# Patient Record
Sex: Female | Born: 1940
Health system: Southern US, Community
[De-identification: ages and names within clinical notes are randomized; demographics above are authoritative.]

## PROBLEM LIST (undated history)

## (undated) DIAGNOSIS — I639 Cerebral infarction, unspecified: Secondary | ICD-10-CM

## (undated) DIAGNOSIS — I729 Aneurysm of unspecified site: Secondary | ICD-10-CM

## (undated) DIAGNOSIS — J189 Pneumonia, unspecified organism: Secondary | ICD-10-CM

## (undated) DIAGNOSIS — M1990 Unspecified osteoarthritis, unspecified site: Secondary | ICD-10-CM

## (undated) DIAGNOSIS — E079 Disorder of thyroid, unspecified: Secondary | ICD-10-CM

## (undated) DIAGNOSIS — E7800 Pure hypercholesterolemia, unspecified: Secondary | ICD-10-CM

## (undated) DIAGNOSIS — I10 Essential (primary) hypertension: Secondary | ICD-10-CM

## (undated) DIAGNOSIS — E039 Hypothyroidism, unspecified: Secondary | ICD-10-CM

## (undated) HISTORY — PX: OTHER SURGICAL HISTORY: SHX169

---

## 1999-11-14 ENCOUNTER — Other Ambulatory Visit: Admission: RE | Admit: 1999-11-14 | Discharge: 1999-11-14 | Payer: Self-pay | Admitting: Obstetrics and Gynecology

## 2000-11-20 ENCOUNTER — Other Ambulatory Visit: Admission: RE | Admit: 2000-11-20 | Discharge: 2000-11-20 | Payer: Self-pay | Admitting: Obstetrics and Gynecology

## 2001-01-26 ENCOUNTER — Inpatient Hospital Stay (HOSPITAL_COMMUNITY): Admission: RE | Admit: 2001-01-26 | Discharge: 2001-01-29 | Payer: Self-pay | Admitting: Obstetrics and Gynecology

## 2001-01-26 ENCOUNTER — Encounter (INDEPENDENT_AMBULATORY_CARE_PROVIDER_SITE_OTHER): Payer: Self-pay

## 2001-12-09 ENCOUNTER — Other Ambulatory Visit: Admission: RE | Admit: 2001-12-09 | Discharge: 2001-12-09 | Payer: Self-pay | Admitting: Obstetrics and Gynecology

## 2002-11-04 ENCOUNTER — Emergency Department (HOSPITAL_COMMUNITY): Admission: EM | Admit: 2002-11-04 | Discharge: 2002-11-04 | Payer: Self-pay | Admitting: Emergency Medicine

## 2002-11-04 ENCOUNTER — Encounter: Payer: Self-pay | Admitting: Emergency Medicine

## 2002-11-05 ENCOUNTER — Encounter: Payer: Self-pay | Admitting: Emergency Medicine

## 2002-12-29 ENCOUNTER — Other Ambulatory Visit: Admission: RE | Admit: 2002-12-29 | Discharge: 2002-12-29 | Payer: Self-pay | Admitting: Obstetrics and Gynecology

## 2004-03-30 ENCOUNTER — Other Ambulatory Visit: Admission: RE | Admit: 2004-03-30 | Discharge: 2004-03-30 | Payer: Self-pay | Admitting: Obstetrics and Gynecology

## 2005-04-05 ENCOUNTER — Other Ambulatory Visit: Admission: RE | Admit: 2005-04-05 | Discharge: 2005-04-05 | Payer: Self-pay | Admitting: Obstetrics and Gynecology

## 2006-06-11 ENCOUNTER — Other Ambulatory Visit: Admission: RE | Admit: 2006-06-11 | Discharge: 2006-06-11 | Payer: Self-pay | Admitting: Obstetrics and Gynecology

## 2007-04-17 ENCOUNTER — Encounter: Admission: RE | Admit: 2007-04-17 | Discharge: 2007-04-17 | Payer: Self-pay | Admitting: Family Medicine

## 2007-07-06 ENCOUNTER — Other Ambulatory Visit: Admission: RE | Admit: 2007-07-06 | Discharge: 2007-07-06 | Payer: Self-pay | Admitting: Obstetrics and Gynecology

## 2008-08-12 ENCOUNTER — Encounter: Admission: RE | Admit: 2008-08-12 | Discharge: 2008-08-12 | Payer: Self-pay | Admitting: Family Medicine

## 2008-08-19 ENCOUNTER — Encounter: Admission: RE | Admit: 2008-08-19 | Discharge: 2008-08-19 | Payer: Self-pay | Admitting: Family Medicine

## 2008-08-29 ENCOUNTER — Encounter: Admission: RE | Admit: 2008-08-29 | Discharge: 2008-08-29 | Payer: Self-pay | Admitting: Family Medicine

## 2008-09-24 ENCOUNTER — Emergency Department (HOSPITAL_COMMUNITY): Admission: EM | Admit: 2008-09-24 | Discharge: 2008-09-24 | Payer: Self-pay | Admitting: Emergency Medicine

## 2008-10-24 ENCOUNTER — Encounter: Admission: RE | Admit: 2008-10-24 | Discharge: 2008-10-24 | Payer: Self-pay | Admitting: Internal Medicine

## 2009-07-24 ENCOUNTER — Encounter: Payer: Self-pay | Admitting: Obstetrics and Gynecology

## 2009-07-24 ENCOUNTER — Ambulatory Visit: Payer: Self-pay | Admitting: Obstetrics and Gynecology

## 2009-07-24 ENCOUNTER — Other Ambulatory Visit: Admission: RE | Admit: 2009-07-24 | Discharge: 2009-07-24 | Payer: Self-pay | Admitting: Obstetrics and Gynecology

## 2009-07-28 ENCOUNTER — Encounter (INDEPENDENT_AMBULATORY_CARE_PROVIDER_SITE_OTHER): Payer: Self-pay | Admitting: *Deleted

## 2010-07-25 ENCOUNTER — Ambulatory Visit: Payer: Self-pay | Admitting: Obstetrics and Gynecology

## 2010-09-18 ENCOUNTER — Encounter: Admission: RE | Admit: 2010-09-18 | Discharge: 2010-09-18 | Payer: Self-pay | Admitting: Ophthalmology

## 2010-12-27 NOTE — Letter (Signed)
Summary: Recall Colonoscopy Letter  [facility]  [address] [place], [place] [address]   Phone: [phone]  Fax: [phone]      July 28, 2009 MRN: [id number]   AMAIA LAVALLIE [address] [place], [place]  [address]   Dear Ms. Windholz,   According to your medical record, it is time for you to schedule a Colonoscopy. The American Cancer Society recommends this procedure as a method to detect early colon cancer. Patients with a family history of colon cancer, or a personal history of colon polyps or inflammatory bowel disease are at increased risk.  This letter has beeen generated based on the recommendations made at the time of your procedure. If you feel that in your particular situation this may no longer apply, please contact our office.  Please call our office at [phone] to schedule this appointment or to update your records at your earliest convenience.  Thank you for cooperating with [place] to provide you with the very best care possible.   Sincerely,  Wilhemina Bonito. Marina Goodell, M.D.  [facility] [phone]

## 2011-04-12 NOTE — H&P (Signed)
[facility]  Patient:    Jodi Woods, Jodi Woods                       MRN: [id number] Attending:  Rande Brunt. Eda Paschal, M.D.                         History and Physical  CHIEF COMPLAINT:  Symptomatic pelvic relaxation with incontinence.  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 Initially, she had considered surgery for this but had continued to postpone it.  Dr. Maretta Bees. Vonita Moss felt that her problem would be amenable to a surgical procedure, at least the urinary stress part of it.  In addition to having this incontinence, she has had both a cystocele, rectocele and uterine prolapse, however, initially, these were not giving her any problems; however, over the past year, she has started to notice that her uterine prolapse has been more of an issue for her, she is aware that the uterus has dropped and that it has been uncomfortable.  She has a history of lower back pain as well as her urinary frequency; she has also had difficulty eliminating stool as a result of the rectocele and therefore, because of a worsening of her uterine prolapse and rectocele as well as her urinary frequency associated with the cystocele and incontinence, she now enters the hospital for vaginal hysterectomy, anterior and posterior repair and sling procedure.  We have discussed removing her ovaries, since she is postmenopausal, and she would like to have them removed if it can be done vaginally; she does not, however, want to have an incision made to remove her ovaries alone.  Obviously, she understands there is some risk of requiring an incision for a complication of her surgery.  PAST MEDICAL HISTORY:  No real significant illnesses.  PRESENT MEDICATIONS:  Ortho-Est and Provera.  ALLERGIES:  She is  allergic to no drugs.  PAST SURGICAL HISTORY:  She has had no previous surgery.  FAMILY HISTORY:  Significant in the family history of heart disease, her maternal grandmother.  SOCIAL HISTORY:  She is a nonsmoker, nondrinker.  REVIEW OF SYSTEMS:  HEENT:  Negative.  CARDIAC:  Negative.  RESPIRATORY: Negative.  GI:  Negative.  GU:  See above.  NEUROMUSCULAR:  Joint pain. ENDOCRINE:  Negative.  PHYSICAL EXAMINATION  GENERAL:  Patient is a well-developed, well-nourished female in no acute distress.  VITAL SIGNS:  Blood pressure is 120/74, pulse is 80 and regular, respirations 16 and nonlabored.  She is afebrile.  HEENT:  All within normal limits.  NECK:  Supple.  Trachea in the midline.  Thyroid is not enlarged.  LUNGS:  Clear to P&A.  HEART:  No thrills, heaves or murmurs.  BREASTS:  No masses.  ABDOMEN:  Soft, without guarding, rebound or masses.  PELVIC:  External is within normal limits.  BUS within normal limits.  Bladder shows loss of the urethrovesical angle; there is also a modest cystocele. Vagina reveals a second degree rectocele.  Cervix is clean.  Pap smear shows no atypia.  Uterus is retroverted, normal size and shape with second degree uterine descensus.  Adnexa are palpably normal.  RECTAL:  Negative.  EXTREMITIES:  Within normal limits.  ADMISSION IMPRESSION 1. Uterine prolapse. 2. Cystocele. 3. Rectocele. 4.  Urinary stress incontinence. DD:  01/26/01 TD:  01/27/01 Job: [id number]

## 2011-04-12 NOTE — Op Note (Signed)
[facility]  Patient:    Jodi Woods, Jodi Woods                       MRN: [id number] Proc. Date: 01/26/01 Attending:  Rande Brunt. Eda Paschal, M.D.                           Operative Report  PREOPERATIVE DIAGNOSES:  Symptomatic uterine prolapse, cystocele, rectocele, urinary stress incontinence.  POSTOPERATIVE DIAGNOSES:  Symptomatic uterine prolapse, cystocele, rectocele, urinary stress incontinence.  OPERATION PERFORMED:  Vaginal hysterectomy followed by exploratory laparotomy with bilateral salpingo-oophorectomy, bladder neck suspension and posterior repair.  SURGEON:  Dr. Eda Paschal and Dr. Vonita Moss.  FIRST ASSISTANT:  Dr. Farrel Gobble.  FINDINGS:  At the time of vaginal hysterectomy, the patient had second degree uterine prolapse, a small cystocele, a large rectocele, ovaries and fallopian tubes were postmenopausal, free of any disease. The pelvic peritoneum was free of any disease.  DESCRIPTION OF PROCEDURE:  After adequate general endotracheal anesthesia, the patient was placed in the dorsal lithotomy position, prepped and draped in the usual sterile manner. A 1:200,000 solution and 0.5% xylocaine was injected around the cervix. A 360 degree incision was made around the cervix, the bladder was mobilized superiorly as was the posterior peritoneum. The posterior peritoneum was entered by sharp dissection as was the vesicouterine fold to peritoneum. The uterosacral ligaments were clamped. In clamping them, they were shortened. They were then sutured to the vaginal cuff laterally for good vault support. The cardinal ligaments, uterine arteries, and balance of the broad ligament were handled in a similar fashion in terms of clamping, cutting and suture ligating. #1 chromic catgut was utilized for all suture material. At this point, the uterus was delivered. On the left, the utero-ovarian ligament, round ligament and fallopian tube were clamped, cut and suture  ligated with #1 chromic catgut. On the right since we had planned to do a bilateral salpingo-oophorectomy, the ovary and tube were delivered which they could not be on the left and the infundibulopelvic ligament was clamped and cut and at this point the uterus was delivered along with the right ovary and tube and sent to pathology for tissue diagnosis. The patients tissues were exceedingly friable and in attempting to suture the infundibulopelvic ligament, uncontrollable bleeding was encountered. The bleeding actually had started when the clamps and the infundibulopelvic ligament came loose freeing up the uterus from it. Initially the infundibulopelvic ligament was controlled with new clamps but every time it was attempted to suture it. There was no good way to suture it and the clamps contained flimsy material that kept ripping and it was obvious that she was bleeding. After about a three to four minute attempt to try to stop it, it became more and more obvious that we were just going to ______ the tissue more. We were very concerned about retraction of blood vessels and it was really felt not safe to continue vaginally. At this point, therefore her legs were put down. A small Pfannenstiel incision was made. The fascia was opened transversely. The peritoneum was entered vertically. The bleeders on the infundibulopelvic ligament were easily identified, they were clamped. The peritoneum was entered on that side to be sure that we had not inadvertently gotten the ureter and we had not as the ureter was significantly below it. Two #1 chromic suture ligatures were placed around the infundibulopelvic ligament and now there was on bleeding whatsoever. The left ovary and  tube which had been left in place and probably would not have been able to be removed vaginally was then removed after the ureter had been identified and once again the infundibulopelvic ligaments were clamped, cut, and doubly  suture ligated with #1 chromic catgut. At this point, Dr. Vonita Moss entered the operating suite having been called to come to do the sling prior to this bleeding problem on the right. The peritoneum was closed with a running #0 Vicryl after two sponge, needle and instrument counts were correct. In evaluating the situation which consisted of the fact that the patient now had a small Pfannenstiel incision and that she had very poor tissue near her bladder as well with an attenuated bladder wall and looking both from an abdominal and vaginal approach, he felt and I agreed that we could do her bladder neck suspension from above and also eliminate the cystocele with the suspension and we felt that was in her best interest so this was done at this point and this note is dictated by Dr. Vonita Moss. We then closed her fascia with two running #0 Vicryl which were running sutures, copiously irrigated her subcutaneous tissue and closed her skin with staples. We next returned to a vaginal approach. The vaginal cuff was sutured to the posterior peritoneum with a running locking #0 Vicryl. A modified McCall enterocele suture was placed to prevent an enterocele with 2-0 Vicryl and then the cuff and peritoneum were closed with a running #0 Vicryl. Attention was next turned to the posterior repair. Prior to starting the posterior repair, we reinspected the anterior wall and she had no cystocele left as a result of his suspension. The vaginal mucosa was undermined from the introitus to the top of the cuff. The perirectal fascia and the rectocele were sharply dissected free from the mucosa. The surgeon put his finger in the rectum to help the dissection. At this point, an excellent dissection had been obtained. The perirectal fascia and the rectocele were brought together with 2-0 Vicryl in order to completely eliminate the rectocele. Approximately 15 such sutures were utilized. The redundant vaginal mucosa was  trimmed away and then the posterior vaginal mucosa was closed with a running locking 2-0 Vicryl also incorporating perirectal fascia to eliminate dead space. It should be noted that when a  decision had been made to do a laparotomy, a Foley catheter had been inserted which drained clear urine. It was reinspected at this time and was still draining clear urine. The vagina was packed with one inch Iodoform. Estimated blood loss was 350 cc with none replaced. The patient tolerated the procedure well and left the operating room in satisfactory condition draining clear urine from her Foley catheter. DD:  01/26/01 TD:  01/27/01 Job: [id number]

## 2011-04-12 NOTE — Op Note (Signed)
[facility]  Patient:    Jodi Woods, Jodi Woods                   MRN: [id number] Proc. Date: 01/26/01 Adm. Date:  [id number] Attending:  Sharon Mt CC:         Rande Brunt. Eda Paschal, M.D.   Operative Report  PREOPERATIVE DIAGNOSES:  Stress urinary incontinence.  POSTOPERATIVE DIAGNOSES:  Stress urinary incontinence.  PROCEDURE:  Suprapubic bladder suspension.  SURGEON:  Dr. Larey Dresser.  ASSISTANT:  Dr. Edyth Gunnels.  INDICATIONS FOR PROCEDURE:  This 70 year old white female needed a rectocele repair and total hysterectomy in addition to surgery for stress incontinence. A vaginal hysterectomy was planned along with a pubovaginal sling. For technical reasons, Dr. Eda Paschal ended up making a suprapubic Pfannenstiel incision to control some bleeding from the right ovarian pedicle and also to do a left oophorectomy. I then scrubbed in and procedure is dictated in the following paragraph.  DESCRIPTION OF PROCEDURE:  Dr. Eda Paschal and I inspected the vaginal canal and the bladder through the suprapubic incision but there did not seem to be any significant cystocele repair to be necessary. So we felt that rather than adding another suprapubic incision near the pubis for a pubovaginal sling and then not having to do an anterior vaginal incision, we had a very good alternative with the suprapubic bladder suspension. After Dr. Eda Paschal closed the peritoneum, I identified the very upper superior aspect of the bladder by mobilizing the Foley catheter balloon and put in a #0 Vicryl figure-of-eight suture with a UR5 needle. At the end of my procedure, I sutured it to the overlying rectus fascia. I put another #0 Vicryl figure-of-eight suture just above the bladder neck in the midline and sutured that to the top of the symphysis pubis nicely elevating the bladder. Two other midline sutures were placed and the lower one was placed in the rectus  fascia just above the symphysis pubis and the other one was midway between that stitch and the one at the very upper anterior aspect of the bladder. Those stitches were then placed through the rectus muscle and into the rectus fascia nicely elevating the bladder in good position. The fascia was then closed with #1 Vicryl and the wound irrigated and closed with skin staples. We then reinspected the vaginal canal and she had no significant cystocele at this time. Dr. Eda Paschal concluded the case dictated separately. DD:  01/26/01 TD:  01/27/01 Job: [id number]

## 2011-04-12 NOTE — Discharge Summary (Signed)
[facility]  Patient:    Jodi Woods, Jodi Woods                   MRN: [id number] Adm. Date:  [id number] Disc. Date: [id number] Attending:  Sharon Mt                           Discharge Summary  HISTORY OF PRESENT ILLNESS:  The patient is a 70 year old female who was admitted to the hospital with symptomatic pelvic relaxation for definitive surgery.  HOSPITAL COURSE:  On the day of admission, she was taken to the operating room.  Initially a vaginal hysterectomy was performed.  She was then to undergo a bilateral salpingo-oophorectomy as was her desire at 32.  The right adnexa could be delivered into the incision, the ligament could be clamped, and the right adnexa was removed.  However, the tissue was extremely friable and part of the infundibular pelvic ligament was lost because of the poor quality of the tissue and it was not felt that it could be adequately ligated from below.  As a result of that, she underwent an exploratory laparotomy through a small Pfannenstiel incision.  The bleeder underneath the infundibular pelvic ligament was quickly identified and ligated.  It also gave [place] the opportunity to remove her left ovary and tube, which would not have been able to be removed vaginally.  At this point, Maretta Bees. Vonita Moss, M.D., came in.  Initially he was to do a sling procedure, but as a result of the small Pfannenstiel incision and the poor quality of her tissue, not just infundibular pelvic ligament at the bladder, he felt that a bladder neck suspension done abdominally would be more appropriate.  This was done.  We closed the laparotomy incision.  We then went below.  Her cystocele had been completely reduced, but her rectocele was repaired at that time. Postoperatively, she had some trouble with nausea and an ileus, but by the third postoperative day, she was doing well with that.  She was also voiding well.  DISPOSITION:  She was  discharged to return to both my office and Dr. Maretta Bees. Petersons office in two to three weeks.  DIET ON DISCHARGE:  Regular.  ACTIVITY ON DISCHARGE:  Ambulatory.  DISCHARGE MEDICATIONS:  Darvocet-N 100 for pain relief and iron for iron replacement.  She is also going to resume her Ortho-Est when she goes home.  The final pathology report revealed uterus with adenomyosis and ovaries and tubes with no pathological abnormalities.  DISCHARGE DIAGNOSES: 1. Uterine prolapse. 2. Cystocele. 3. Rectocele. 4. Urinary stress incontinence. 5. Adenomyosis.  OPERATIONS: 1. Vaginal hysterectomy. 2. Exploratory laparotomy. 3. Bilateral salpingo-oophorectomy. 4. Bladder neck suspension. 5. Rectocele repair. DD:  01/29/01 TD:  01/29/01 Job: [id number]

## 2011-12-06 ENCOUNTER — Other Ambulatory Visit: Payer: Self-pay | Admitting: *Deleted

## 2011-12-06 DIAGNOSIS — M8580 Other specified disorders of bone density and structure, unspecified site: Secondary | ICD-10-CM

## 2011-12-27 ENCOUNTER — Encounter: Payer: Self-pay | Admitting: Obstetrics and Gynecology

## 2011-12-30 ENCOUNTER — Other Ambulatory Visit: Payer: Self-pay | Admitting: Obstetrics and Gynecology

## 2011-12-30 DIAGNOSIS — M8580 Other specified disorders of bone density and structure, unspecified site: Secondary | ICD-10-CM

## 2013-01-27 ENCOUNTER — Other Ambulatory Visit: Payer: Self-pay | Admitting: Internal Medicine

## 2013-01-27 DIAGNOSIS — R9389 Abnormal findings on diagnostic imaging of other specified body structures: Secondary | ICD-10-CM

## 2013-01-29 ENCOUNTER — Other Ambulatory Visit: Payer: Self-pay

## 2013-02-02 ENCOUNTER — Ambulatory Visit
Admission: RE | Admit: 2013-02-02 | Discharge: 2013-02-02 | Disposition: A | Discharge status: Home / Self Care | Payer: Medicare Other | Source: Ambulatory Visit | Attending: Internal Medicine | Admitting: Internal Medicine

## 2013-02-02 DIAGNOSIS — R9389 Abnormal findings on diagnostic imaging of other specified body structures: Secondary | ICD-10-CM

## 2013-02-02 MED ORDER — IOHEXOL 300 MG/ML  SOLN
75.0000 mL | Freq: Once | INTRAMUSCULAR | Status: AC | PRN
  Administered 2013-02-02: 75 mL via INTRAVENOUS

## 2015-01-27 DIAGNOSIS — H16103 Unspecified superficial keratitis, bilateral: Secondary | ICD-10-CM | POA: Diagnosis not present

## 2015-01-27 DIAGNOSIS — H04123 Dry eye syndrome of bilateral lacrimal glands: Secondary | ICD-10-CM | POA: Diagnosis not present

## 2015-01-27 DIAGNOSIS — H26491 Other secondary cataract, right eye: Secondary | ICD-10-CM | POA: Diagnosis not present

## 2015-01-27 DIAGNOSIS — H31001 Unspecified chorioretinal scars, right eye: Secondary | ICD-10-CM | POA: Diagnosis not present

## 2015-07-10 DIAGNOSIS — H8110 Benign paroxysmal vertigo, unspecified ear: Secondary | ICD-10-CM | POA: Diagnosis not present

## 2015-07-10 DIAGNOSIS — R252 Cramp and spasm: Secondary | ICD-10-CM | POA: Diagnosis not present

## 2015-07-10 DIAGNOSIS — M545 Low back pain: Secondary | ICD-10-CM | POA: Diagnosis not present

## 2015-07-10 DIAGNOSIS — I10 Essential (primary) hypertension: Secondary | ICD-10-CM | POA: Diagnosis not present

## 2015-07-20 DIAGNOSIS — M6281 Muscle weakness (generalized): Secondary | ICD-10-CM | POA: Diagnosis not present

## 2015-07-20 DIAGNOSIS — M545 Low back pain: Secondary | ICD-10-CM | POA: Diagnosis not present

## 2015-07-20 DIAGNOSIS — M546 Pain in thoracic spine: Secondary | ICD-10-CM | POA: Diagnosis not present

## 2015-07-25 DIAGNOSIS — M545 Low back pain: Secondary | ICD-10-CM | POA: Diagnosis not present

## 2015-07-25 DIAGNOSIS — M546 Pain in thoracic spine: Secondary | ICD-10-CM | POA: Diagnosis not present

## 2015-07-25 DIAGNOSIS — M6281 Muscle weakness (generalized): Secondary | ICD-10-CM | POA: Diagnosis not present

## 2015-07-27 DIAGNOSIS — M6281 Muscle weakness (generalized): Secondary | ICD-10-CM | POA: Diagnosis not present

## 2015-07-27 DIAGNOSIS — M545 Low back pain: Secondary | ICD-10-CM | POA: Diagnosis not present

## 2015-07-27 DIAGNOSIS — M546 Pain in thoracic spine: Secondary | ICD-10-CM | POA: Diagnosis not present

## 2015-08-04 DIAGNOSIS — M545 Low back pain: Secondary | ICD-10-CM | POA: Diagnosis not present

## 2015-08-04 DIAGNOSIS — M6281 Muscle weakness (generalized): Secondary | ICD-10-CM | POA: Diagnosis not present

## 2015-08-04 DIAGNOSIS — M546 Pain in thoracic spine: Secondary | ICD-10-CM | POA: Diagnosis not present

## 2015-08-08 DIAGNOSIS — M545 Low back pain: Secondary | ICD-10-CM | POA: Diagnosis not present

## 2015-08-08 DIAGNOSIS — M546 Pain in thoracic spine: Secondary | ICD-10-CM | POA: Diagnosis not present

## 2015-08-08 DIAGNOSIS — M6281 Muscle weakness (generalized): Secondary | ICD-10-CM | POA: Diagnosis not present

## 2015-08-10 DIAGNOSIS — M6281 Muscle weakness (generalized): Secondary | ICD-10-CM | POA: Diagnosis not present

## 2015-08-10 DIAGNOSIS — M546 Pain in thoracic spine: Secondary | ICD-10-CM | POA: Diagnosis not present

## 2015-08-10 DIAGNOSIS — M545 Low back pain: Secondary | ICD-10-CM | POA: Diagnosis not present

## 2015-08-14 DIAGNOSIS — M6281 Muscle weakness (generalized): Secondary | ICD-10-CM | POA: Diagnosis not present

## 2015-08-14 DIAGNOSIS — M546 Pain in thoracic spine: Secondary | ICD-10-CM | POA: Diagnosis not present

## 2015-08-14 DIAGNOSIS — M545 Low back pain: Secondary | ICD-10-CM | POA: Diagnosis not present

## 2015-08-18 DIAGNOSIS — M545 Low back pain: Secondary | ICD-10-CM | POA: Diagnosis not present

## 2015-08-18 DIAGNOSIS — M6281 Muscle weakness (generalized): Secondary | ICD-10-CM | POA: Diagnosis not present

## 2015-08-18 DIAGNOSIS — M546 Pain in thoracic spine: Secondary | ICD-10-CM | POA: Diagnosis not present

## 2015-08-24 DIAGNOSIS — M545 Low back pain: Secondary | ICD-10-CM | POA: Diagnosis not present

## 2015-08-24 DIAGNOSIS — M6281 Muscle weakness (generalized): Secondary | ICD-10-CM | POA: Diagnosis not present

## 2015-08-24 DIAGNOSIS — M546 Pain in thoracic spine: Secondary | ICD-10-CM | POA: Diagnosis not present

## 2015-08-28 DIAGNOSIS — M545 Low back pain: Secondary | ICD-10-CM | POA: Diagnosis not present

## 2015-08-28 DIAGNOSIS — M546 Pain in thoracic spine: Secondary | ICD-10-CM | POA: Diagnosis not present

## 2015-08-28 DIAGNOSIS — M6281 Muscle weakness (generalized): Secondary | ICD-10-CM | POA: Diagnosis not present

## 2015-08-30 DIAGNOSIS — M545 Low back pain: Secondary | ICD-10-CM | POA: Diagnosis not present

## 2015-08-30 DIAGNOSIS — M546 Pain in thoracic spine: Secondary | ICD-10-CM | POA: Diagnosis not present

## 2015-08-30 DIAGNOSIS — M6281 Muscle weakness (generalized): Secondary | ICD-10-CM | POA: Diagnosis not present

## 2015-09-04 DIAGNOSIS — M546 Pain in thoracic spine: Secondary | ICD-10-CM | POA: Diagnosis not present

## 2015-09-04 DIAGNOSIS — M545 Low back pain: Secondary | ICD-10-CM | POA: Diagnosis not present

## 2015-09-04 DIAGNOSIS — M6281 Muscle weakness (generalized): Secondary | ICD-10-CM | POA: Diagnosis not present

## 2015-09-07 DIAGNOSIS — M546 Pain in thoracic spine: Secondary | ICD-10-CM | POA: Diagnosis not present

## 2015-09-07 DIAGNOSIS — M6281 Muscle weakness (generalized): Secondary | ICD-10-CM | POA: Diagnosis not present

## 2015-09-07 DIAGNOSIS — M545 Low back pain: Secondary | ICD-10-CM | POA: Diagnosis not present

## 2015-09-19 DIAGNOSIS — M545 Low back pain: Secondary | ICD-10-CM | POA: Diagnosis not present

## 2015-09-19 DIAGNOSIS — M546 Pain in thoracic spine: Secondary | ICD-10-CM | POA: Diagnosis not present

## 2015-09-19 DIAGNOSIS — M6281 Muscle weakness (generalized): Secondary | ICD-10-CM | POA: Diagnosis not present

## 2015-09-22 DIAGNOSIS — M545 Low back pain: Secondary | ICD-10-CM | POA: Diagnosis not present

## 2015-09-22 DIAGNOSIS — M546 Pain in thoracic spine: Secondary | ICD-10-CM | POA: Diagnosis not present

## 2015-09-22 DIAGNOSIS — M6281 Muscle weakness (generalized): Secondary | ICD-10-CM | POA: Diagnosis not present

## 2015-09-28 DIAGNOSIS — M546 Pain in thoracic spine: Secondary | ICD-10-CM | POA: Diagnosis not present

## 2015-09-28 DIAGNOSIS — M6281 Muscle weakness (generalized): Secondary | ICD-10-CM | POA: Diagnosis not present

## 2015-09-28 DIAGNOSIS — M545 Low back pain: Secondary | ICD-10-CM | POA: Diagnosis not present

## 2015-10-13 DIAGNOSIS — M6281 Muscle weakness (generalized): Secondary | ICD-10-CM | POA: Diagnosis not present

## 2015-10-13 DIAGNOSIS — M545 Low back pain: Secondary | ICD-10-CM | POA: Diagnosis not present

## 2015-10-13 DIAGNOSIS — M546 Pain in thoracic spine: Secondary | ICD-10-CM | POA: Diagnosis not present

## 2016-01-19 DIAGNOSIS — Z1389 Encounter for screening for other disorder: Secondary | ICD-10-CM | POA: Diagnosis not present

## 2016-01-19 DIAGNOSIS — I10 Essential (primary) hypertension: Secondary | ICD-10-CM | POA: Diagnosis not present

## 2016-01-19 DIAGNOSIS — E039 Hypothyroidism, unspecified: Secondary | ICD-10-CM | POA: Diagnosis not present

## 2016-01-19 DIAGNOSIS — H6121 Impacted cerumen, right ear: Secondary | ICD-10-CM | POA: Diagnosis not present

## 2016-01-19 DIAGNOSIS — Z0000 Encounter for general adult medical examination without abnormal findings: Secondary | ICD-10-CM | POA: Diagnosis not present

## 2016-01-19 DIAGNOSIS — E7800 Pure hypercholesterolemia, unspecified: Secondary | ICD-10-CM | POA: Diagnosis not present

## 2016-01-25 DIAGNOSIS — H6121 Impacted cerumen, right ear: Secondary | ICD-10-CM | POA: Diagnosis not present

## 2016-01-26 DIAGNOSIS — M85851 Other specified disorders of bone density and structure, right thigh: Secondary | ICD-10-CM | POA: Diagnosis not present

## 2016-01-26 DIAGNOSIS — Z1231 Encounter for screening mammogram for malignant neoplasm of breast: Secondary | ICD-10-CM | POA: Diagnosis not present

## 2016-02-01 DIAGNOSIS — H04123 Dry eye syndrome of bilateral lacrimal glands: Secondary | ICD-10-CM | POA: Diagnosis not present

## 2016-02-01 DIAGNOSIS — H31001 Unspecified chorioretinal scars, right eye: Secondary | ICD-10-CM | POA: Diagnosis not present

## 2016-02-01 DIAGNOSIS — H16103 Unspecified superficial keratitis, bilateral: Secondary | ICD-10-CM | POA: Diagnosis not present

## 2016-02-01 DIAGNOSIS — Z0100 Encounter for examination of eyes and vision without abnormal findings: Secondary | ICD-10-CM | POA: Diagnosis not present

## 2016-03-01 DIAGNOSIS — Z6830 Body mass index (BMI) 30.0-30.9, adult: Secondary | ICD-10-CM | POA: Diagnosis not present

## 2016-03-01 DIAGNOSIS — M85851 Other specified disorders of bone density and structure, right thigh: Secondary | ICD-10-CM | POA: Diagnosis not present

## 2016-03-01 DIAGNOSIS — E663 Overweight: Secondary | ICD-10-CM | POA: Diagnosis not present

## 2016-03-01 DIAGNOSIS — I10 Essential (primary) hypertension: Secondary | ICD-10-CM | POA: Diagnosis not present

## 2016-03-09 DIAGNOSIS — Z6830 Body mass index (BMI) 30.0-30.9, adult: Secondary | ICD-10-CM | POA: Diagnosis not present

## 2016-03-09 DIAGNOSIS — I10 Essential (primary) hypertension: Secondary | ICD-10-CM | POA: Diagnosis not present

## 2016-03-09 DIAGNOSIS — E785 Hyperlipidemia, unspecified: Secondary | ICD-10-CM | POA: Diagnosis not present

## 2016-03-09 DIAGNOSIS — E039 Hypothyroidism, unspecified: Secondary | ICD-10-CM | POA: Diagnosis not present

## 2016-05-01 DIAGNOSIS — M25561 Pain in right knee: Secondary | ICD-10-CM | POA: Diagnosis not present

## 2016-05-03 DIAGNOSIS — D2371 Other benign neoplasm of skin of right lower limb, including hip: Secondary | ICD-10-CM | POA: Diagnosis not present

## 2016-05-03 DIAGNOSIS — M2011 Hallux valgus (acquired), right foot: Secondary | ICD-10-CM | POA: Diagnosis not present

## 2016-08-07 DIAGNOSIS — G8929 Other chronic pain: Secondary | ICD-10-CM | POA: Diagnosis not present

## 2016-08-07 DIAGNOSIS — M8580 Other specified disorders of bone density and structure, unspecified site: Secondary | ICD-10-CM | POA: Diagnosis not present

## 2016-08-07 DIAGNOSIS — I10 Essential (primary) hypertension: Secondary | ICD-10-CM | POA: Diagnosis not present

## 2016-08-07 DIAGNOSIS — Z23 Encounter for immunization: Secondary | ICD-10-CM | POA: Diagnosis not present

## 2016-08-07 DIAGNOSIS — M25561 Pain in right knee: Secondary | ICD-10-CM | POA: Diagnosis not present

## 2016-08-16 DIAGNOSIS — M1711 Unilateral primary osteoarthritis, right knee: Secondary | ICD-10-CM | POA: Diagnosis not present

## 2016-10-29 DIAGNOSIS — R42 Dizziness and giddiness: Secondary | ICD-10-CM | POA: Diagnosis not present

## 2016-10-29 DIAGNOSIS — R05 Cough: Secondary | ICD-10-CM | POA: Diagnosis not present

## 2016-10-30 ENCOUNTER — Other Ambulatory Visit: Payer: Self-pay | Admitting: Internal Medicine

## 2016-10-30 ENCOUNTER — Ambulatory Visit
Admission: RE | Admit: 2016-10-30 | Discharge: 2016-10-30 | Disposition: A | Discharge status: Home / Self Care | Payer: Medicare PPO | Source: Ambulatory Visit | Attending: Internal Medicine | Admitting: Internal Medicine

## 2016-10-30 DIAGNOSIS — R05 Cough: Secondary | ICD-10-CM

## 2016-10-30 DIAGNOSIS — R059 Cough, unspecified: Secondary | ICD-10-CM

## 2017-01-22 DIAGNOSIS — Z0000 Encounter for general adult medical examination without abnormal findings: Secondary | ICD-10-CM | POA: Diagnosis not present

## 2017-01-22 DIAGNOSIS — Z6830 Body mass index (BMI) 30.0-30.9, adult: Secondary | ICD-10-CM | POA: Diagnosis not present

## 2017-01-22 DIAGNOSIS — I10 Essential (primary) hypertension: Secondary | ICD-10-CM | POA: Diagnosis not present

## 2017-01-22 DIAGNOSIS — E039 Hypothyroidism, unspecified: Secondary | ICD-10-CM | POA: Diagnosis not present

## 2017-01-22 DIAGNOSIS — E669 Obesity, unspecified: Secondary | ICD-10-CM | POA: Diagnosis not present

## 2017-01-22 DIAGNOSIS — Z1389 Encounter for screening for other disorder: Secondary | ICD-10-CM | POA: Diagnosis not present

## 2017-01-22 DIAGNOSIS — E7800 Pure hypercholesterolemia, unspecified: Secondary | ICD-10-CM | POA: Diagnosis not present

## 2017-02-03 DIAGNOSIS — Z1231 Encounter for screening mammogram for malignant neoplasm of breast: Secondary | ICD-10-CM | POA: Diagnosis not present

## 2017-03-06 DIAGNOSIS — I10 Essential (primary) hypertension: Secondary | ICD-10-CM | POA: Diagnosis not present

## 2017-03-12 DIAGNOSIS — H04123 Dry eye syndrome of bilateral lacrimal glands: Secondary | ICD-10-CM | POA: Diagnosis not present

## 2017-03-12 DIAGNOSIS — H52203 Unspecified astigmatism, bilateral: Secondary | ICD-10-CM | POA: Diagnosis not present

## 2017-03-12 DIAGNOSIS — H31001 Unspecified chorioretinal scars, right eye: Secondary | ICD-10-CM | POA: Diagnosis not present

## 2017-03-12 DIAGNOSIS — H26491 Other secondary cataract, right eye: Secondary | ICD-10-CM | POA: Diagnosis not present

## 2017-03-18 DIAGNOSIS — Z5181 Encounter for therapeutic drug level monitoring: Secondary | ICD-10-CM | POA: Diagnosis not present

## 2017-04-04 DIAGNOSIS — E669 Obesity, unspecified: Secondary | ICD-10-CM | POA: Diagnosis not present

## 2017-04-04 DIAGNOSIS — Z6830 Body mass index (BMI) 30.0-30.9, adult: Secondary | ICD-10-CM | POA: Diagnosis not present

## 2017-04-04 DIAGNOSIS — M545 Low back pain: Secondary | ICD-10-CM | POA: Diagnosis not present

## 2017-04-04 DIAGNOSIS — M479 Spondylosis, unspecified: Secondary | ICD-10-CM | POA: Diagnosis not present

## 2017-04-04 DIAGNOSIS — Z7289 Other problems related to lifestyle: Secondary | ICD-10-CM | POA: Diagnosis not present

## 2017-04-04 DIAGNOSIS — E785 Hyperlipidemia, unspecified: Secondary | ICD-10-CM | POA: Diagnosis not present

## 2017-04-04 DIAGNOSIS — H547 Unspecified visual loss: Secondary | ICD-10-CM | POA: Diagnosis not present

## 2017-04-04 DIAGNOSIS — I10 Essential (primary) hypertension: Secondary | ICD-10-CM | POA: Diagnosis not present

## 2017-04-04 DIAGNOSIS — E039 Hypothyroidism, unspecified: Secondary | ICD-10-CM | POA: Diagnosis not present

## 2017-04-11 DIAGNOSIS — I10 Essential (primary) hypertension: Secondary | ICD-10-CM | POA: Diagnosis not present

## 2017-04-24 DIAGNOSIS — H26491 Other secondary cataract, right eye: Secondary | ICD-10-CM | POA: Diagnosis not present

## 2017-05-13 DIAGNOSIS — I10 Essential (primary) hypertension: Secondary | ICD-10-CM | POA: Diagnosis not present

## 2017-05-13 DIAGNOSIS — S60562A Insect bite (nonvenomous) of left hand, initial encounter: Secondary | ICD-10-CM | POA: Diagnosis not present

## 2017-05-13 DIAGNOSIS — W57XXXA Bitten or stung by nonvenomous insect and other nonvenomous arthropods, initial encounter: Secondary | ICD-10-CM | POA: Diagnosis not present

## 2017-06-06 DIAGNOSIS — S61409A Unspecified open wound of unspecified hand, initial encounter: Secondary | ICD-10-CM | POA: Diagnosis not present

## 2017-06-06 DIAGNOSIS — W540XXA Bitten by dog, initial encounter: Secondary | ICD-10-CM | POA: Diagnosis not present

## 2017-06-06 DIAGNOSIS — Z23 Encounter for immunization: Secondary | ICD-10-CM | POA: Diagnosis not present

## 2017-06-13 DIAGNOSIS — A09 Infectious gastroenteritis and colitis, unspecified: Secondary | ICD-10-CM | POA: Diagnosis not present

## 2017-06-16 DIAGNOSIS — A09 Infectious gastroenteritis and colitis, unspecified: Secondary | ICD-10-CM | POA: Diagnosis not present

## 2017-08-28 DIAGNOSIS — D2371 Other benign neoplasm of skin of right lower limb, including hip: Secondary | ICD-10-CM | POA: Diagnosis not present

## 2017-08-28 DIAGNOSIS — D2372 Other benign neoplasm of skin of left lower limb, including hip: Secondary | ICD-10-CM | POA: Diagnosis not present

## 2017-08-28 DIAGNOSIS — M21962 Unspecified acquired deformity of left lower leg: Secondary | ICD-10-CM | POA: Diagnosis not present

## 2017-08-28 DIAGNOSIS — M21961 Unspecified acquired deformity of right lower leg: Secondary | ICD-10-CM | POA: Diagnosis not present

## 2017-09-25 DIAGNOSIS — Z23 Encounter for immunization: Secondary | ICD-10-CM | POA: Diagnosis not present

## 2017-09-25 DIAGNOSIS — E669 Obesity, unspecified: Secondary | ICD-10-CM | POA: Diagnosis not present

## 2017-09-25 DIAGNOSIS — I10 Essential (primary) hypertension: Secondary | ICD-10-CM | POA: Diagnosis not present

## 2017-11-11 DIAGNOSIS — I10 Essential (primary) hypertension: Secondary | ICD-10-CM | POA: Diagnosis not present

## 2017-11-11 DIAGNOSIS — E039 Hypothyroidism, unspecified: Secondary | ICD-10-CM | POA: Diagnosis not present

## 2017-11-11 DIAGNOSIS — M159 Polyosteoarthritis, unspecified: Secondary | ICD-10-CM | POA: Diagnosis not present

## 2018-01-26 DIAGNOSIS — E669 Obesity, unspecified: Secondary | ICD-10-CM | POA: Diagnosis not present

## 2018-01-26 DIAGNOSIS — E039 Hypothyroidism, unspecified: Secondary | ICD-10-CM | POA: Diagnosis not present

## 2018-01-26 DIAGNOSIS — Z0000 Encounter for general adult medical examination without abnormal findings: Secondary | ICD-10-CM | POA: Diagnosis not present

## 2018-01-26 DIAGNOSIS — I10 Essential (primary) hypertension: Secondary | ICD-10-CM | POA: Diagnosis not present

## 2018-01-26 DIAGNOSIS — E7800 Pure hypercholesterolemia, unspecified: Secondary | ICD-10-CM | POA: Diagnosis not present

## 2018-01-26 DIAGNOSIS — Z1389 Encounter for screening for other disorder: Secondary | ICD-10-CM | POA: Diagnosis not present

## 2018-01-26 DIAGNOSIS — R8290 Unspecified abnormal findings in urine: Secondary | ICD-10-CM | POA: Diagnosis not present

## 2018-01-26 DIAGNOSIS — R51 Headache: Secondary | ICD-10-CM | POA: Diagnosis not present

## 2018-02-17 DIAGNOSIS — M159 Polyosteoarthritis, unspecified: Secondary | ICD-10-CM | POA: Diagnosis not present

## 2018-02-17 DIAGNOSIS — E039 Hypothyroidism, unspecified: Secondary | ICD-10-CM | POA: Diagnosis not present

## 2018-02-17 DIAGNOSIS — I10 Essential (primary) hypertension: Secondary | ICD-10-CM | POA: Diagnosis not present

## 2018-02-27 DIAGNOSIS — H04123 Dry eye syndrome of bilateral lacrimal glands: Secondary | ICD-10-CM | POA: Diagnosis not present

## 2018-02-27 DIAGNOSIS — H3500 Unspecified background retinopathy: Secondary | ICD-10-CM | POA: Diagnosis not present

## 2018-02-27 DIAGNOSIS — H31002 Unspecified chorioretinal scars, left eye: Secondary | ICD-10-CM | POA: Diagnosis not present

## 2018-02-27 DIAGNOSIS — H52203 Unspecified astigmatism, bilateral: Secondary | ICD-10-CM | POA: Diagnosis not present

## 2018-03-09 DIAGNOSIS — Z1231 Encounter for screening mammogram for malignant neoplasm of breast: Secondary | ICD-10-CM | POA: Diagnosis not present

## 2018-03-09 DIAGNOSIS — M8589 Other specified disorders of bone density and structure, multiple sites: Secondary | ICD-10-CM | POA: Diagnosis not present

## 2018-08-18 ENCOUNTER — Ambulatory Visit
Admission: RE | Admit: 2018-08-18 | Discharge: 2018-08-18 | Disposition: A | Discharge status: Home / Self Care | Payer: Medicare PPO | Source: Ambulatory Visit | Attending: Internal Medicine | Admitting: Internal Medicine

## 2018-08-18 ENCOUNTER — Other Ambulatory Visit: Payer: Self-pay | Admitting: Internal Medicine

## 2018-08-18 DIAGNOSIS — R0600 Dyspnea, unspecified: Secondary | ICD-10-CM

## 2018-08-18 DIAGNOSIS — R5383 Other fatigue: Secondary | ICD-10-CM | POA: Diagnosis not present

## 2018-08-18 DIAGNOSIS — H9191 Unspecified hearing loss, right ear: Secondary | ICD-10-CM | POA: Diagnosis not present

## 2018-08-18 DIAGNOSIS — R400 Somnolence: Secondary | ICD-10-CM | POA: Diagnosis not present

## 2018-08-18 DIAGNOSIS — I10 Essential (primary) hypertension: Secondary | ICD-10-CM | POA: Diagnosis not present

## 2018-08-18 DIAGNOSIS — R0609 Other forms of dyspnea: Secondary | ICD-10-CM | POA: Diagnosis not present

## 2018-12-07 DIAGNOSIS — R0602 Shortness of breath: Secondary | ICD-10-CM | POA: Diagnosis not present

## 2019-01-04 DIAGNOSIS — Z7982 Long term (current) use of aspirin: Secondary | ICD-10-CM | POA: Diagnosis not present

## 2019-01-04 DIAGNOSIS — M545 Low back pain: Secondary | ICD-10-CM | POA: Diagnosis not present

## 2019-01-04 DIAGNOSIS — H9190 Unspecified hearing loss, unspecified ear: Secondary | ICD-10-CM | POA: Diagnosis not present

## 2019-01-04 DIAGNOSIS — M1990 Unspecified osteoarthritis, unspecified site: Secondary | ICD-10-CM | POA: Diagnosis not present

## 2019-01-04 DIAGNOSIS — H547 Unspecified visual loss: Secondary | ICD-10-CM | POA: Diagnosis not present

## 2019-01-04 DIAGNOSIS — E039 Hypothyroidism, unspecified: Secondary | ICD-10-CM | POA: Diagnosis not present

## 2019-01-04 DIAGNOSIS — I10 Essential (primary) hypertension: Secondary | ICD-10-CM | POA: Diagnosis not present

## 2019-01-04 DIAGNOSIS — Z6830 Body mass index (BMI) 30.0-30.9, adult: Secondary | ICD-10-CM | POA: Diagnosis not present

## 2019-01-04 DIAGNOSIS — E785 Hyperlipidemia, unspecified: Secondary | ICD-10-CM | POA: Diagnosis not present

## 2019-01-08 DIAGNOSIS — J449 Chronic obstructive pulmonary disease, unspecified: Secondary | ICD-10-CM | POA: Diagnosis not present

## 2019-01-08 DIAGNOSIS — R1310 Dysphagia, unspecified: Secondary | ICD-10-CM | POA: Diagnosis not present

## 2019-01-08 DIAGNOSIS — G8929 Other chronic pain: Secondary | ICD-10-CM | POA: Diagnosis not present

## 2019-01-08 DIAGNOSIS — M545 Low back pain: Secondary | ICD-10-CM | POA: Diagnosis not present

## 2019-03-18 DIAGNOSIS — E7800 Pure hypercholesterolemia, unspecified: Secondary | ICD-10-CM | POA: Diagnosis not present

## 2019-03-18 DIAGNOSIS — E039 Hypothyroidism, unspecified: Secondary | ICD-10-CM | POA: Diagnosis not present

## 2019-03-18 DIAGNOSIS — I10 Essential (primary) hypertension: Secondary | ICD-10-CM | POA: Diagnosis not present

## 2019-03-19 DIAGNOSIS — J449 Chronic obstructive pulmonary disease, unspecified: Secondary | ICD-10-CM | POA: Diagnosis not present

## 2019-03-19 DIAGNOSIS — M545 Low back pain: Secondary | ICD-10-CM | POA: Diagnosis not present

## 2019-03-19 DIAGNOSIS — R110 Nausea: Secondary | ICD-10-CM | POA: Diagnosis not present

## 2019-03-19 DIAGNOSIS — E039 Hypothyroidism, unspecified: Secondary | ICD-10-CM | POA: Diagnosis not present

## 2019-03-19 DIAGNOSIS — D126 Benign neoplasm of colon, unspecified: Secondary | ICD-10-CM | POA: Diagnosis not present

## 2019-03-19 DIAGNOSIS — H9193 Unspecified hearing loss, bilateral: Secondary | ICD-10-CM | POA: Diagnosis not present

## 2019-03-19 DIAGNOSIS — Z1389 Encounter for screening for other disorder: Secondary | ICD-10-CM | POA: Diagnosis not present

## 2019-03-19 DIAGNOSIS — E7800 Pure hypercholesterolemia, unspecified: Secondary | ICD-10-CM | POA: Diagnosis not present

## 2019-03-19 DIAGNOSIS — I10 Essential (primary) hypertension: Secondary | ICD-10-CM | POA: Diagnosis not present

## 2019-03-30 DIAGNOSIS — M25511 Pain in right shoulder: Secondary | ICD-10-CM | POA: Diagnosis not present

## 2019-03-30 DIAGNOSIS — M545 Low back pain: Secondary | ICD-10-CM | POA: Diagnosis not present

## 2019-04-14 DIAGNOSIS — S46011A Strain of muscle(s) and tendon(s) of the rotator cuff of right shoulder, initial encounter: Secondary | ICD-10-CM | POA: Diagnosis not present

## 2019-04-23 DIAGNOSIS — M2042 Other hammer toe(s) (acquired), left foot: Secondary | ICD-10-CM | POA: Diagnosis not present

## 2019-04-23 DIAGNOSIS — M21611 Bunion of right foot: Secondary | ICD-10-CM | POA: Diagnosis not present

## 2019-04-23 DIAGNOSIS — M21612 Bunion of left foot: Secondary | ICD-10-CM | POA: Diagnosis not present

## 2019-04-23 DIAGNOSIS — M2041 Other hammer toe(s) (acquired), right foot: Secondary | ICD-10-CM | POA: Diagnosis not present

## 2019-05-01 DIAGNOSIS — M25511 Pain in right shoulder: Secondary | ICD-10-CM | POA: Diagnosis not present

## 2019-05-05 DIAGNOSIS — M7581 Other shoulder lesions, right shoulder: Secondary | ICD-10-CM | POA: Diagnosis not present

## 2019-05-17 DIAGNOSIS — H903 Sensorineural hearing loss, bilateral: Secondary | ICD-10-CM | POA: Diagnosis not present

## 2019-05-17 DIAGNOSIS — H9193 Unspecified hearing loss, bilateral: Secondary | ICD-10-CM | POA: Diagnosis not present

## 2019-05-17 DIAGNOSIS — IMO0001 Reserved for inherently not codable concepts without codable children: Secondary | ICD-10-CM | POA: Insufficient documentation

## 2019-06-08 DIAGNOSIS — R110 Nausea: Secondary | ICD-10-CM | POA: Diagnosis not present

## 2019-06-08 DIAGNOSIS — M19041 Primary osteoarthritis, right hand: Secondary | ICD-10-CM | POA: Diagnosis not present

## 2019-06-08 DIAGNOSIS — H938X1 Other specified disorders of right ear: Secondary | ICD-10-CM | POA: Diagnosis not present

## 2019-06-08 DIAGNOSIS — H9193 Unspecified hearing loss, bilateral: Secondary | ICD-10-CM | POA: Diagnosis not present

## 2019-06-08 DIAGNOSIS — M19042 Primary osteoarthritis, left hand: Secondary | ICD-10-CM | POA: Diagnosis not present

## 2019-06-10 DIAGNOSIS — R110 Nausea: Secondary | ICD-10-CM | POA: Diagnosis not present

## 2019-06-14 DIAGNOSIS — Z1231 Encounter for screening mammogram for malignant neoplasm of breast: Secondary | ICD-10-CM | POA: Diagnosis not present

## 2019-06-29 ENCOUNTER — Other Ambulatory Visit: Payer: Self-pay | Admitting: Otolaryngology

## 2019-06-29 DIAGNOSIS — H918X3 Other specified hearing loss, bilateral: Secondary | ICD-10-CM

## 2019-06-29 DIAGNOSIS — IMO0001 Reserved for inherently not codable concepts without codable children: Secondary | ICD-10-CM

## 2019-07-05 DIAGNOSIS — R110 Nausea: Secondary | ICD-10-CM | POA: Diagnosis not present

## 2019-07-08 DIAGNOSIS — H5340 Unspecified visual field defects: Secondary | ICD-10-CM | POA: Diagnosis not present

## 2019-07-08 DIAGNOSIS — H52203 Unspecified astigmatism, bilateral: Secondary | ICD-10-CM | POA: Diagnosis not present

## 2019-07-08 DIAGNOSIS — H4720 Unspecified optic atrophy: Secondary | ICD-10-CM | POA: Diagnosis not present

## 2019-07-08 DIAGNOSIS — H3500 Unspecified background retinopathy: Secondary | ICD-10-CM | POA: Diagnosis not present

## 2019-07-12 DIAGNOSIS — H4720 Unspecified optic atrophy: Secondary | ICD-10-CM | POA: Diagnosis not present

## 2019-07-24 ENCOUNTER — Other Ambulatory Visit: Payer: Self-pay

## 2019-07-24 ENCOUNTER — Ambulatory Visit
Admission: RE | Admit: 2019-07-24 | Discharge: 2019-07-24 | Disposition: A | Discharge status: Home / Self Care | Payer: Medicare PPO | Source: Ambulatory Visit | Attending: Otolaryngology | Admitting: Otolaryngology

## 2019-07-24 DIAGNOSIS — IMO0001 Reserved for inherently not codable concepts without codable children: Secondary | ICD-10-CM

## 2019-07-24 DIAGNOSIS — H538 Other visual disturbances: Secondary | ICD-10-CM | POA: Diagnosis not present

## 2019-07-24 DIAGNOSIS — H918X3 Other specified hearing loss, bilateral: Secondary | ICD-10-CM | POA: Diagnosis not present

## 2019-07-24 DIAGNOSIS — H9201 Otalgia, right ear: Secondary | ICD-10-CM | POA: Diagnosis not present

## 2019-07-24 MED ORDER — GADOBENATE DIMEGLUMINE 529 MG/ML IV SOLN
15.0000 mL | Freq: Once | INTRAVENOUS | Status: AC | PRN
  Administered 2019-07-24: 15 mL via INTRAVENOUS

## 2019-08-11 ENCOUNTER — Other Ambulatory Visit: Payer: Self-pay

## 2019-08-11 ENCOUNTER — Inpatient Hospital Stay: Payer: Medicare PPO

## 2019-08-11 ENCOUNTER — Inpatient Hospital Stay: Payer: Medicare PPO | Attending: Oncology | Admitting: Oncology

## 2019-08-11 VITALS — BP 145/61 | HR 72 | Temp 98.0°F | Resp 17 | Ht 62.0 in | Wt 162.8 lb

## 2019-08-11 DIAGNOSIS — E785 Hyperlipidemia, unspecified: Secondary | ICD-10-CM

## 2019-08-11 DIAGNOSIS — Z8673 Personal history of transient ischemic attack (TIA), and cerebral infarction without residual deficits: Secondary | ICD-10-CM | POA: Diagnosis not present

## 2019-08-11 DIAGNOSIS — D473 Essential (hemorrhagic) thrombocythemia: Secondary | ICD-10-CM | POA: Insufficient documentation

## 2019-08-11 DIAGNOSIS — H9190 Unspecified hearing loss, unspecified ear: Secondary | ICD-10-CM | POA: Insufficient documentation

## 2019-08-11 DIAGNOSIS — E669 Obesity, unspecified: Secondary | ICD-10-CM | POA: Diagnosis not present

## 2019-08-11 DIAGNOSIS — R609 Edema, unspecified: Secondary | ICD-10-CM | POA: Diagnosis not present

## 2019-08-11 DIAGNOSIS — H539 Unspecified visual disturbance: Secondary | ICD-10-CM | POA: Diagnosis not present

## 2019-08-11 DIAGNOSIS — R5383 Other fatigue: Secondary | ICD-10-CM | POA: Diagnosis not present

## 2019-08-11 DIAGNOSIS — I10 Essential (primary) hypertension: Secondary | ICD-10-CM

## 2019-08-11 DIAGNOSIS — D72829 Elevated white blood cell count, unspecified: Secondary | ICD-10-CM | POA: Insufficient documentation

## 2019-08-11 DIAGNOSIS — N83299 Other ovarian cyst, unspecified side: Secondary | ICD-10-CM | POA: Diagnosis not present

## 2019-08-11 DIAGNOSIS — Z79899 Other long term (current) drug therapy: Secondary | ICD-10-CM | POA: Diagnosis not present

## 2019-08-11 LAB — CBC WITH DIFFERENTIAL (CANCER CENTER ONLY)
Abs Immature Granulocytes: 0.4 10*3/uL — ABNORMAL HIGH (ref 0.00–0.07)
Band Neutrophils: 3 %
Basophils Absolute: 0.4 10*3/uL — ABNORMAL HIGH (ref 0.0–0.1)
Basophils Relative: 3 %
Eosinophils Absolute: 0.4 10*3/uL (ref 0.0–0.5)
Eosinophils Relative: 3 %
HCT: 42.5 % (ref 36.0–46.0)
Hemoglobin: 13.2 g/dL (ref 12.0–15.0)
Lymphocytes Relative: 19 %
Lymphs Abs: 2.8 10*3/uL (ref 0.7–4.0)
MCH: 26.6 pg (ref 26.0–34.0)
MCHC: 31.1 g/dL (ref 30.0–36.0)
MCV: 85.5 fL (ref 80.0–100.0)
Metamyelocytes Relative: 3 %
Monocytes Absolute: 0.4 10*3/uL (ref 0.1–1.0)
Monocytes Relative: 3 %
Neutro Abs: 10.1 10*3/uL (ref 1.7–17.7)
Neutrophils Relative %: 66 %
Platelet Count: 609 10*3/uL — ABNORMAL HIGH (ref 150–400)
RBC: 4.97 MIL/uL (ref 3.87–5.11)
RDW: 17.1 % — ABNORMAL HIGH (ref 11.5–15.5)
WBC Count: 14.6 10*3/uL — ABNORMAL HIGH (ref 4.0–10.5)
nRBC: 0 % (ref 0.0–0.2)

## 2019-08-11 LAB — CMP (CANCER CENTER ONLY)
ALT: 19 U/L (ref 0–44)
AST: 31 U/L (ref 15–41)
Albumin: 4.1 g/dL (ref 3.5–5.0)
Alkaline Phosphatase: 60 U/L (ref 38–126)
Anion gap: 7 (ref 5–15)
BUN: 22 mg/dL (ref 8–23)
CO2: 29 mmol/L (ref 22–32)
Calcium: 9.6 mg/dL (ref 8.9–10.3)
Chloride: 104 mmol/L (ref 98–111)
Creatinine: 1.01 mg/dL — ABNORMAL HIGH (ref 0.44–1.00)
GFR, Est AFR Am: >60 mL/min (ref >60)
GFR, Estimated: 53 mL/min — ABNORMAL LOW (ref >60)
Glucose, Bld: 97 mg/dL (ref 70–99)
Potassium: 4.8 mmol/L (ref 3.5–5.1)
Sodium: 140 mmol/L (ref 135–145)
Total Bilirubin: 0.6 mg/dL (ref 0.3–1.2)
Total Protein: 7 g/dL (ref 6.5–8.1)

## 2019-08-11 LAB — IRON AND TIBC
Iron: 50 ug/dL (ref 41–142)
Saturation Ratios: 12 % — ABNORMAL LOW (ref 21–57)
TIBC: 420 ug/dL (ref 236–444)
UIBC: 370 ug/dL (ref 120–384)

## 2019-08-11 LAB — FERRITIN: Ferritin: 23 ng/mL (ref 11–307)

## 2019-08-11 NOTE — Progress Notes (Signed)
Reason for the request:    Leukocytosis  HPI: I was asked by Dr. Laurann Montana to evaluate Jodi Woods for leukocytosis.  She is a 78 year old woman with history of hypertension and obesity.  She was found to have elevated white cell count back in July 2020.  White cell count was 13.8 with a hemoglobin of 12.1 and a platelet count of 573.  He had an elevated RDW of 16.5 with neutrophil percentage slightly elevated at 74.8.  She also had some basophilia with 7.9%.  CBC obtained on July 05, 2019 showed a white cell count of 14.6, platelet count of 621 with neutrophilia, basophilia and normal hemoglobin.  Her RDW remains elevated.  He reports few vague complaints predominantly fatigue and not feeling well at times although she is eating reasonably fair and weight is stable.  She still ambulates and attends to most activities of daily living.  She is the caregiver for her husband that have multiple health issues that required hospitalization and home care.  This have added a lot of stress as of late which could have contributed to her complaints.  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  She does not report any headaches, blurry vision, syncope or seizures. Does not report any fevers, chills or sweats.  Does not report any cough, wheezing or hemoptysis.  Does not report any chest pain, palpitation, orthopnea or leg edema.  Does not report any nausea, vomiting or abdominal pain.  Does not report any constipation or diarrhea.  Does not report any skeletal complaints.    Does not report frequency, urgency or hematuria.  Does not report any skin rashes or lesions. Does not report any heat or cold intolerance.  Does not report any lymphadenopathy or petechiae.  Does not report any anxiety or depression.  Remaining review of systems is negative.    No past medical history is  significant for migraines, arthritis hyperlipidemia, obesity, hypertension     medication reviewed including Tylenol, Aleve, atorvastatin, lisinopril hydrochlorothiazide combination, vitamin D and Synthroid.    He is intolerant to amlodipine with edema.  No family history of any malignant disorder.  Social History   Socioeconomic History  . Marital status: Married    Spouse name: Not on file  . Number of children: Not on file  . Years of education: Not on file  . Highest education level: Not on file  Occupational History  . Not on file  Social Needs  . Financial resource strain: Not on file  . Food insecurity    Worry: Not on file    Inability: Not on file  . Transportation needs    Medical: Not on file    Non-medical: Not on file  Tobacco Use  . Smoking status: Not on file  Substance and Sexual Activity  . Alcohol use: Not on file  . Drug use: Not on file  . Sexual activity: Not on file  Lifestyle  . Physical activity    Days per week: Not on file    Minutes per session: Not on file  . Stress: Not on file  Relationships  . Social Herbalist on phone: Not on file    Gets together: Not on file    Attends religious service: Not on file    Active member of club or organization: Not on file  Attends meetings of clubs or organizations: Not on file    Relationship status: Not on file  . Intimate partner violence    Fear of current or ex partner: Not on file    Emotionally abused: Not on file    Physically abused: Not on file    Forced sexual activity: Not on file  Other Topics Concern  . Not on file  Social History Narrative  . Not on file  :  Pertinent items are noted in HPI.  Exam: Blood pressure (!) 145/61, pulse 72, temperature 98 F (36.7 C), temperature source Oral, resp. rate 17, height 5\' 2"  (1.575 m), weight 162 lb 12.8 oz (73.8 kg), SpO2 98 %.   General appearance: alert and cooperative appeared without distress. Head: atraumatic  without any abnormalities. Eyes: conjunctivae/corneas clear. PERRL.  Sclera anicteric. Throat: lips, mucosa, and tongue normal; without oral thrush or ulcers. Resp: clear to auscultation bilaterally without rhonchi, wheezes or dullness to percussion. Cardio: regular rate and rhythm, S1, S2 normal, no murmur, click, rub or gallop GI: soft, non-tender; bowel sounds normal; no masses,  no organomegaly Skin: Skin color, texture, turgor normal. No rashes or lesions Lymph nodes: Cervical, supraclavicular, and axillary nodes normal. Neurologic: Grossly normal without any motor, sensory or deep tendon reflexes. Musculoskeletal: No joint deformity or effusion.  CBC   Mr Albertina [place] Wo Contrast  Result Date: 07/24/2019 CLINICAL DATA:  Pressure in the right ear. Hearing loss. Left eye visual disturbance. EXAM: MRI HEAD WITHOUT AND WITH CONTRAST TECHNIQUE: Multiplanar, multiecho pulse sequences of the brain and surrounding structures were obtained without and with intravenous contrast. CONTRAST:  58mL MULTIHANCE GADOBENATE DIMEGLUMINE 529 MG/ML IV SOLN COMPARISON:  None. FINDINGS: Brain: Diffusion imaging does not show any acute or subacute infarction. Minimal chronic small-vessel change of the pons. No focal cerebellar insult. Cerebral hemispheres show an old lacunar infarction in the left basal ganglia. The patient does not show widespread small-vessel disease. No cortical or large vessel territory infarction. No intra-axial mass lesion, hemorrhage, hydrocephalus or extra-axial collection. CP angle regions are normal. No vestibular schwannoma. Vascular: Major vessels at the base of the brain show flow. Skull and upper cervical spine: Negative Sinuses/Orbits: Clear except for insignificant retention cysts in the maxillary sinus floors. Orbits negative. Other: None IMPRESSION: No evidence of vestibular schwannoma. Minimal small vessel change of the pons. 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 The brain does  not show generalized atrophy, widespread small-vessel disease or any large vessel territory insult. Electronically Signed   By: Nelson Chimes M.D.   On: 07/24/2019 13:48    Assessment and Plan:   78 year old with:  1.  Leukocytosis and thrombocytosis: This was detected on laboratory testing in June and July 2020.  Her white cell count has been between 13 and 14 with a platelet count in the 500 and 600 range.  Her white cell count differential showed neutrophilia with occasional basophilia.  The differential diagnosis of these findings would include reactive leukocytosis and thrombocytosis related to recent infection or stress.  Autoimmune disease could also be a possibility.  Primary hematological condition such as a myeloproliferative disorder is also a consideration.  Chronic myelogenous leukemia, essential thrombocythemia and that marrow fibrosis are on the differential.  To work-up this up completely at this time, I will repeat CBC today including repeat iron studies to rule out iron deficiency as a cause of thrombocytosis.  Molecular testing for chronic myelogenous leukemia as well as other myeloproliferative disorder will be completed and pending  these results further evaluation and management will be needed.  The natural course of these diseases were reviewed today with the patient in case she has done.  Management options that include active surveillance versus using oral agents such as hydroxyurea among others were reviewed.  Chronic myelogenous leukemia will be treated differently and the implication of that will be discussed separately if she has that diagnosis.  2.  Follow-up: Will be determined pending the results of her evaluation.  60  minutes was spent with the patient face-to-face today.  More than 50% of time was spent on reviewing the data, differential diagnosis and management options for the future.   Thank you for the referral.  A copy of this consult has been forwarded to  the requesting physician.

## 2019-08-13 DIAGNOSIS — M19011 Primary osteoarthritis, right shoulder: Secondary | ICD-10-CM | POA: Diagnosis not present

## 2019-08-18 DIAGNOSIS — H3500 Unspecified background retinopathy: Secondary | ICD-10-CM | POA: Diagnosis not present

## 2019-08-18 DIAGNOSIS — H31001 Unspecified chorioretinal scars, right eye: Secondary | ICD-10-CM | POA: Diagnosis not present

## 2019-08-18 DIAGNOSIS — H53462 Homonymous bilateral field defects, left side: Secondary | ICD-10-CM | POA: Diagnosis not present

## 2019-08-18 DIAGNOSIS — H4720 Unspecified optic atrophy: Secondary | ICD-10-CM | POA: Diagnosis not present

## 2019-08-20 ENCOUNTER — Other Ambulatory Visit: Payer: Self-pay | Admitting: Oncology

## 2019-08-20 DIAGNOSIS — D508 Other iron deficiency anemias: Secondary | ICD-10-CM

## 2019-08-20 NOTE — Progress Notes (Signed)
Results of her work-up was reviewed and discussed via phone.  She has borderline low iron which could contribute to her thrombocytosis but does not explain her leukocytosis.  Her work-up for chronic myelogenous leukemia and other myeloproliferative disorders are negative.  I recommended continued monitoring at this time and repeat iron studies in 4 months and repeat an evaluation at that time.  Oral iron supplementation may be recommended if these findings persist.

## 2019-08-23 ENCOUNTER — Telehealth: Payer: Self-pay | Admitting: Oncology

## 2019-08-23 NOTE — Telephone Encounter (Signed)
Scheduled appt per 9/25 sch message - pt aware of appt date and time

## 2019-08-23 NOTE — Telephone Encounter (Signed)
Scheduled appt per 9/25 sch message - pt is aware of appt date and time.   

## 2019-09-07 DIAGNOSIS — I6381 Other cerebral infarction due to occlusion or stenosis of small artery: Secondary | ICD-10-CM | POA: Diagnosis not present

## 2019-09-07 DIAGNOSIS — E7800 Pure hypercholesterolemia, unspecified: Secondary | ICD-10-CM | POA: Diagnosis not present

## 2019-09-07 DIAGNOSIS — D72829 Elevated white blood cell count, unspecified: Secondary | ICD-10-CM | POA: Diagnosis not present

## 2019-09-07 DIAGNOSIS — I10 Essential (primary) hypertension: Secondary | ICD-10-CM | POA: Diagnosis not present

## 2019-09-07 DIAGNOSIS — R5381 Other malaise: Secondary | ICD-10-CM | POA: Diagnosis not present

## 2019-09-07 LAB — BCR ABL1 FISH (GENPATH)

## 2019-09-07 LAB — JAK2 (INCLUDING V617F AND EXON 12), MPL,& CALR-NEXT GEN SEQ

## 2019-10-19 DIAGNOSIS — R1312 Dysphagia, oropharyngeal phase: Secondary | ICD-10-CM | POA: Diagnosis not present

## 2019-10-19 DIAGNOSIS — K219 Gastro-esophageal reflux disease without esophagitis: Secondary | ICD-10-CM | POA: Diagnosis not present

## 2019-10-19 DIAGNOSIS — R110 Nausea: Secondary | ICD-10-CM | POA: Diagnosis not present

## 2019-10-19 DIAGNOSIS — K625 Hemorrhage of anus and rectum: Secondary | ICD-10-CM | POA: Diagnosis not present

## 2019-10-19 DIAGNOSIS — Z86010 Personal history of colonic polyps: Secondary | ICD-10-CM | POA: Diagnosis not present

## 2019-12-17 ENCOUNTER — Inpatient Hospital Stay: Payer: Medicare PPO | Attending: Oncology

## 2019-12-17 ENCOUNTER — Other Ambulatory Visit: Payer: Self-pay

## 2019-12-17 DIAGNOSIS — D508 Other iron deficiency anemias: Secondary | ICD-10-CM

## 2019-12-17 DIAGNOSIS — Z7982 Long term (current) use of aspirin: Secondary | ICD-10-CM | POA: Diagnosis not present

## 2019-12-17 DIAGNOSIS — C9210 Chronic myeloid leukemia, BCR/ABL-positive, not having achieved remission: Secondary | ICD-10-CM | POA: Diagnosis not present

## 2019-12-17 DIAGNOSIS — R7989 Other specified abnormal findings of blood chemistry: Secondary | ICD-10-CM | POA: Diagnosis not present

## 2019-12-17 DIAGNOSIS — K219 Gastro-esophageal reflux disease without esophagitis: Secondary | ICD-10-CM | POA: Insufficient documentation

## 2019-12-17 DIAGNOSIS — Z79899 Other long term (current) drug therapy: Secondary | ICD-10-CM | POA: Insufficient documentation

## 2019-12-17 DIAGNOSIS — E785 Hyperlipidemia, unspecified: Secondary | ICD-10-CM | POA: Insufficient documentation

## 2019-12-17 LAB — IRON AND TIBC
Iron: 80 ug/dL (ref 41–142)
Saturation Ratios: 22 % (ref 21–57)
TIBC: 366 ug/dL (ref 236–444)
UIBC: 286 ug/dL (ref 120–384)

## 2019-12-17 LAB — CBC WITH DIFFERENTIAL (CANCER CENTER ONLY)
Abs Immature Granulocytes: 1.83 10*3/uL — ABNORMAL HIGH (ref 0.00–0.07)
Band Neutrophils: 2 %
Basophils Absolute: 0.7 10*3/uL — ABNORMAL HIGH (ref 0.0–0.1)
Basophils Relative: 4 %
Blasts: 0 %
Eosinophils Absolute: 0.8 10*3/uL — ABNORMAL HIGH (ref 0.0–0.5)
Eosinophils Relative: 5 %
HCT: 41.2 % (ref 36.0–46.0)
Hemoglobin: 13.1 g/dL (ref 12.0–15.0)
Lymphocytes Relative: 11 %
Lymphs Abs: 1.8 10*3/uL (ref 0.7–4.0)
MCH: 27.7 pg (ref 26.0–34.0)
MCHC: 31.8 g/dL (ref 30.0–36.0)
MCV: 87.1 fL (ref 80.0–100.0)
Metamyelocytes Relative: 4 %
Monocytes Absolute: 1 10*3/uL (ref 0.1–1.0)
Monocytes Relative: 6 %
Myelocytes: 7 %
Neutro Abs: 10.5 10*3/uL — ABNORMAL HIGH (ref 1.7–7.7)
Neutrophils Relative %: 61 %
Other: 0 %
Platelet Count: 558 10*3/uL — ABNORMAL HIGH (ref 150–400)
Promyelocytes Relative: 0 %
RBC: 4.73 MIL/uL (ref 3.87–5.11)
RDW: 16.3 % — ABNORMAL HIGH (ref 11.5–15.5)
WBC Count: 16.6 10*3/uL — ABNORMAL HIGH (ref 4.0–10.5)
nRBC: 0 % (ref 0.0–0.2)
nRBC: 0 /100{WBCs}

## 2019-12-17 LAB — FERRITIN: Ferritin: 61 ng/mL (ref 11–307)

## 2019-12-20 ENCOUNTER — Ambulatory Visit: Payer: Medicare PPO | Attending: Internal Medicine

## 2019-12-20 DIAGNOSIS — Z23 Encounter for immunization: Secondary | ICD-10-CM

## 2019-12-20 NOTE — Progress Notes (Signed)
   Covid-19 Vaccination Clinic  Name:  Jodi Woods    MRN: [id number] DOB: 07/26/1941  12/20/2019  Jodi Woods was observed post Covid-19 immunization for 15 minutes without incidence. She was provided with Vaccine Information Sheet and instruction to access the V-Safe system.   Jodi Woods was instructed to call 911 with any severe reactions post vaccine: Marland Kitchen Difficulty breathing  . Swelling of your face and throat  . A fast heartbeat  . A bad rash all over your body  . Dizziness and weakness    Immunizations Administered    Name Date Dose VIS Date Route   Pfizer COVID-19 Vaccine 12/20/2019  6:21 PM 0.3 mL 11/05/2019 Intramuscular   Manufacturer: [place]   Lot: [id number]   [place]: [id number]

## 2019-12-24 ENCOUNTER — Telehealth: Payer: Self-pay

## 2019-12-24 ENCOUNTER — Other Ambulatory Visit: Payer: Self-pay

## 2019-12-24 ENCOUNTER — Telehealth: Payer: Self-pay | Admitting: Pharmacist

## 2019-12-24 ENCOUNTER — Inpatient Hospital Stay (HOSPITAL_BASED_OUTPATIENT_CLINIC_OR_DEPARTMENT_OTHER): Payer: Medicare PPO | Admitting: Oncology

## 2019-12-24 VITALS — BP 159/49 | HR 68 | Temp 98.0°F | Resp 18 | Ht 62.0 in | Wt 162.9 lb

## 2019-12-24 DIAGNOSIS — C9210 Chronic myeloid leukemia, BCR/ABL-positive, not having achieved remission: Secondary | ICD-10-CM | POA: Diagnosis not present

## 2019-12-24 DIAGNOSIS — E785 Hyperlipidemia, unspecified: Secondary | ICD-10-CM | POA: Diagnosis not present

## 2019-12-24 DIAGNOSIS — D508 Other iron deficiency anemias: Secondary | ICD-10-CM | POA: Diagnosis not present

## 2019-12-24 DIAGNOSIS — K219 Gastro-esophageal reflux disease without esophagitis: Secondary | ICD-10-CM | POA: Diagnosis not present

## 2019-12-24 DIAGNOSIS — Z79899 Other long term (current) drug therapy: Secondary | ICD-10-CM | POA: Diagnosis not present

## 2019-12-24 DIAGNOSIS — Z7982 Long term (current) use of aspirin: Secondary | ICD-10-CM | POA: Diagnosis not present

## 2019-12-24 DIAGNOSIS — R7989 Other specified abnormal findings of blood chemistry: Secondary | ICD-10-CM | POA: Diagnosis not present

## 2019-12-24 MED ORDER — DASATINIB 100 MG PO TABS: 100.0000 mg | ORAL_TABLET | Freq: Every day | ORAL | 0 refills | Status: DC

## 2019-12-24 NOTE — Telephone Encounter (Signed)
Oral Oncology Patient Advocate Encounter  Received notification from [facility] [employer] that prior authorization for Sprycel is required.  PA submitted on CoverMyMeds Key [id number] Status is pending  Oral Oncology Clinic will continue to follow.  [place] [facility] [place] Phone [phone] Fax [phone] 12/24/2019 3:40 PM

## 2019-12-24 NOTE — Progress Notes (Signed)
Hematology and Oncology Follow Up Visit  Jodi Woods [id number] [birth year] 79 y.o. 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  She presented with leukocytosis and thrombocytosis.  Her BCR/ABL by FISH was positive for gene rearrangement    Current therapy: Under evaluation to start therapy.  Interim History: [name] returns today for a follow-up visit.  Since her last visit, she reports overall feeling better with improvement in her overall health.  He had vague symptoms for the last year including nausea, fatigue and dysphagia which has improved as of late.  He denies any fevers, chills night sweats.  She denies any abdominal pain but does report weight gain.  Quality of life remains unchanged.     Medications: I have reviewed the patient's current medications.  Current Outpatient Medications  Medication Sig Dispense Refill  . aspirin EC 81 MG tablet Take 81 mg by mouth daily.    Marland Kitchen atorvastatin (LIPITOR) 10 MG tablet Take 10 mg by mouth daily.    . cholecalciferol (VITAMIN D3) 25 MCG (1000 UT) tablet Take 1,000 Units by mouth daily.    Marland Kitchen levothyroxine (SYNTHROID) 100 MCG tablet Take 100 mcg by mouth daily before breakfast.    . lisinopril-hydrochlorothiazide (ZESTORETIC) 20-12.5 MG tablet Take 1 tablet by mouth daily.    . pantoprazole (PROTONIX) 40 MG tablet Take 40 mg by mouth daily.    Marland Kitchen zinc gluconate 50 MG tablet Take 50 mg by mouth daily.    . dasatinib (SPRYCEL) 100 MG tablet Take 1 tablet (100 mg total) by mouth daily. 30 tablet 0   No current facility-administered medications for this visit.     Allergies:  Allergies  Allergen Reactions  . Norvasc [Amlodipine Besylate]       Physical Exam: Blood pressure (!) 159/49, pulse 68, temperature 98 F (36.7 C), temperature source Temporal, resp. rate 18, height 5\' 2"  (1.575 m), weight 162 lb 14.4 oz (73.9 kg), SpO2  99 %. ECOG: 0    General appearance: Comfortable appearing without any discomfort Head: Normocephalic without any trauma Oropharynx: Mucous membranes are moist and pink without any thrush or ulcers. Eyes: Pupils are equal and round reactive to light. Lymph nodes: No cervical, supraclavicular, inguinal or axillary lymphadenopathy.   Heart:regular rate and rhythm.  S1 and S2 without leg edema. Lung: Clear without any rhonchi or wheezes.  No dullness to percussion. Abdomin: Soft, nontender, nondistended with good bowel sounds.  Could not palpate splenomegaly. Musculoskeletal: No joint deformity or effusion.  Full range of motion noted. Neurological: No deficits noted on motor, sensory and deep tendon reflex exam. Skin: No petechial rash or dryness.  Appeared moist.  Psychiatric: Mood and affect appeared appropriate.,   Lab Results: Lab Results  Component Value Date   WBC 16.6 (H) 12/17/2019   HGB 13.1 12/17/2019   HCT 41.2 12/17/2019   MCV 87.1 12/17/2019   PLT 558 (H) 12/17/2019     Chemistry      Component Value Date/Time   NA 140 08/11/2019 1118   K 4.8 08/11/2019 1118   CL 104 08/11/2019 1118   CO2 29 08/11/2019 1118   BUN 22 08/11/2019 1118   CREATININE 1.01 (H) 08/11/2019 1118      Component Value Date/Time   CALCIUM 9.6 08/11/2019 1118   ALKPHOS 60 08/11/2019 1118   AST 31 08/11/2019 1118   ALT 19 08/11/2019 1118   BILITOT 0.6 08/11/2019 1118  Impression and Plan:  79 year old woman with:  1.  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  Her CBC from January 22 was reviewed and showed mild increase in her white cell count although persistent elevation which is consistent with myeloproliferative disorder.  The rationale for treating this condition was discussed today and  potential complications for not treating it were reviewed.  These would include worsening leukocytosis, splenomegaly, excessive fatigue, bruising and transformation to acute leukemia.  Oral targeted therapy remains the standard of care at this time and she would be a reasonable candidate for dasatinib.  Complication associated with this medication include nausea, fatigue, myelosuppression, fluid retention, pleural effusion, skin rash among others.  She is agreeable to proceed at this time I will start with dasatinib 100 mg daily and will check counts in 4 weeks.  Alternative options such as imatinib, nilotinib at among others.  2.  Thrombocytosis: This is related to her CML anticipate improvement at this time with treating her condition.  No risk of thrombosis or bleeding at this time.  3.  Follow-up: Will be in 4 weeks for repeat evaluation.   30  minutes was spent on this encounter.  The time was dedicated to reviewing her disease status, laboratory data, discussing treatment options and future plan of care.   Zola Button, MD 1/29/20219:27 AM

## 2019-12-24 NOTE — Telephone Encounter (Signed)
Oral Oncology Pharmacist Encounter  Received new prescription for Sprycel (dasatinib) for the treatment of CML, planned duration until disease progression or unacceptable drug toxicity.  CBC from 12/17/19 assessed, no relevant lab abnormalities. Prescription dose and frequency assessed.   Current medication list in Epic reviewed, one relevant DDIs with dasatinib identified: - Pantoprazole: Proton Pump Inhibitors (PPI) may diminish the concentration of dasatinib. Recommend evaluating the need for a PPI/acid suppression. If acid suppression is needed, recommend switching to a calcium carbonate (TUMS) to avoid this DDI. Tums should be separated from his dasatinib by at least 2 hours.  Prescription has been e-scribed to the [facility] for benefits analysis and approval.  Oral Oncology Clinic will continue to follow for insurance authorization, copayment issues, initial counseling and start date.  Darl Pikes, PharmD, BCPS, [facility] Hematology/Oncology Clinical Pharmacist [facility] [phone]  12/24/2019 10:19 AM

## 2019-12-27 ENCOUNTER — Telehealth: Payer: Self-pay | Admitting: Oncology

## 2019-12-27 NOTE — Telephone Encounter (Signed)
Scheduled per 1/29 los. Called and spoke with pt, confirmed 3/2 appt

## 2019-12-27 NOTE — Telephone Encounter (Signed)
Oral Oncology Patient Advocate Encounter  Prior Authorization for Sprycel has been approved.    PA# [id number] Effective dates: 12/24/19 through 12/23/20  Patients co-pay is $350  Oral Oncology Clinic will continue to follow.   [place] Patient [place] Phone [phone] Fax [phone] 12/27/2019 8:49 AM

## 2019-12-28 NOTE — Addendum Note (Signed)
Addended by: Darl Pikes on: 12/28/2019 11:27 AM   Modules accepted: Orders

## 2019-12-28 NOTE — Telephone Encounter (Addendum)
Oral Chemotherapy Pharmacist Encounter  Ms. Touch plans on picking up her Sprycel from [place] today 12/28/19. She knows to get started when she receives the medication.  Patient Education I spoke with patient for overview of new oral chemotherapy medication: Sprycel (dasatinib) for the treatment of CML, planned duration until disease progression or unacceptable drug toxicity.   Pt is doing well. Counseled patient on administration, dosing, side effects, monitoring, drug-food interactions, safe handling, storage, and disposal. Patient will take 1 tablet (100 mg total) by mouth daily.  Reviewed DDI with pantoprazole. Patient reported she only remembers to take her pantoprazole about 3 times a week and it is a new medication to her. She is willing to stop the pantoprazole and use calcium carbonate if the needs a medication for reflux.  Side effects include but not limited to: edema, fatigue, decreased wbc/plt/hgb.    Reviewed with patient importance of keeping a medication schedule and plan for any missed doses.  Ms. Raineri voiced understanding and appreciation. All questions answered. Medication handout placed in the mail.  Provided patient with [facility] [place] [facility] phone number. Patient knows to call the office with questions or concerns. Oral Chemotherapy Navigation Clinic will continue to follow.  Darl Pikes, PharmD, BCPS, [facility] Hematology/Oncology Clinical Pharmacist [facility] [place] [facility] [phone]  12/28/2019 11:10 AM

## 2019-12-29 MED FILL — SPRYCEL 100 MG TABLET: 100 | 30 days supply | Qty: 30 | Fill #0

## 2020-01-12 ENCOUNTER — Ambulatory Visit: Payer: Medicare PPO

## 2020-01-14 ENCOUNTER — Ambulatory Visit: Payer: Medicare PPO

## 2020-01-16 ENCOUNTER — Ambulatory Visit: Payer: Medicare PPO | Attending: Internal Medicine

## 2020-01-16 DIAGNOSIS — Z23 Encounter for immunization: Secondary | ICD-10-CM | POA: Insufficient documentation

## 2020-01-16 NOTE — Progress Notes (Signed)
   Covid-19 Vaccination Clinic  Name:  Trinna Enzor    MRN: [id number] DOB: 1941-08-24  01/16/2020  Ms. Doersam was observed post Covid-19 immunization for 15 minutes without incidence. She was provided with Vaccine Information Sheet and instruction to access the V-Safe system.   Ms. Neally was instructed to call 911 with any severe reactions post vaccine: Marland Kitchen Difficulty breathing  . Swelling of your face and throat  . A fast heartbeat  . A bad rash all over your body  . Dizziness and weakness    Immunizations Administered    Name Date Dose VIS Date Route   Pfizer COVID-19 Vaccine 01/16/2020  1:13 PM 0.3 mL 11/05/2019 Intramuscular   Manufacturer: [place]   Lot: [id number]   [place]: [id number]

## 2020-01-18 ENCOUNTER — Other Ambulatory Visit: Payer: Self-pay | Admitting: Oncology

## 2020-01-20 ENCOUNTER — Telehealth: Payer: Self-pay

## 2020-01-20 NOTE — Telephone Encounter (Signed)
Called patient and made her aware of Dr. Hazeline Junker response. Patient aware to call the office with any further questions or concerns.

## 2020-01-20 NOTE — Telephone Encounter (Signed)
-----   Message from Wyatt Portela, MD sent at 01/20/2020 10:11 AM EST ----- These are predictable side effects related to Sprycel and Covid vaccine.  No intervention is needed at this time.  We will continue to monitor.  Thanks ----- Message ----- From: Tami Lin, RN Sent: 01/20/2020  10:04 AM EST To: Wyatt Portela, MD  Patient started taking Sprycel 100 mg on 12/30/19. She states she has recently noticed scalp tenderness, nipple tenderness, fatigue, and small bumps on her face that she cannot see but can feel. She said she knows these may be common side effects but just wanted to make you aware. She got her COVID vaccine on Sunday and felt terrible Sunday night into Monday morning. Her next lab/MD appointment is Tuesday 01/25/20.  Lanelle Bal

## 2020-01-24 MED FILL — SPRYCEL 100 MG TABLET: 100 | 30 days supply | Qty: 30 | Fill #0

## 2020-01-25 ENCOUNTER — Inpatient Hospital Stay: Payer: Medicare PPO | Attending: Oncology | Admitting: Oncology

## 2020-01-25 ENCOUNTER — Other Ambulatory Visit: Payer: Self-pay

## 2020-01-25 ENCOUNTER — Inpatient Hospital Stay: Payer: Medicare PPO

## 2020-01-25 VITALS — BP 161/52 | HR 80 | Temp 98.2°F | Resp 16 | Ht 62.0 in | Wt 164.6 lb

## 2020-01-25 DIAGNOSIS — R5383 Other fatigue: Secondary | ICD-10-CM | POA: Insufficient documentation

## 2020-01-25 DIAGNOSIS — E785 Hyperlipidemia, unspecified: Secondary | ICD-10-CM | POA: Insufficient documentation

## 2020-01-25 DIAGNOSIS — R609 Edema, unspecified: Secondary | ICD-10-CM | POA: Diagnosis not present

## 2020-01-25 DIAGNOSIS — D508 Other iron deficiency anemias: Secondary | ICD-10-CM | POA: Diagnosis not present

## 2020-01-25 DIAGNOSIS — R7989 Other specified abnormal findings of blood chemistry: Secondary | ICD-10-CM | POA: Insufficient documentation

## 2020-01-25 DIAGNOSIS — D696 Thrombocytopenia, unspecified: Secondary | ICD-10-CM | POA: Diagnosis not present

## 2020-01-25 DIAGNOSIS — C9210 Chronic myeloid leukemia, BCR/ABL-positive, not having achieved remission: Secondary | ICD-10-CM | POA: Diagnosis not present

## 2020-01-25 DIAGNOSIS — D649 Anemia, unspecified: Secondary | ICD-10-CM | POA: Diagnosis not present

## 2020-01-25 DIAGNOSIS — E039 Hypothyroidism, unspecified: Secondary | ICD-10-CM | POA: Insufficient documentation

## 2020-01-25 DIAGNOSIS — Z7982 Long term (current) use of aspirin: Secondary | ICD-10-CM | POA: Diagnosis not present

## 2020-01-25 DIAGNOSIS — Z79899 Other long term (current) drug therapy: Secondary | ICD-10-CM | POA: Insufficient documentation

## 2020-01-25 LAB — CMP (CANCER CENTER ONLY)
ALT: 42 U/L (ref 0–44)
AST: 39 U/L (ref 15–41)
Albumin: 4 g/dL (ref 3.5–5.0)
Alkaline Phosphatase: 72 U/L (ref 38–126)
Anion gap: 9 (ref 5–15)
BUN: 24 mg/dL — ABNORMAL HIGH (ref 8–23)
CO2: 25 mmol/L (ref 22–32)
Calcium: 8.8 mg/dL — ABNORMAL LOW (ref 8.9–10.3)
Chloride: 104 mmol/L (ref 98–111)
Creatinine: 1.03 mg/dL — ABNORMAL HIGH (ref 0.44–1.00)
GFR, Est AFR Am: 60 mL/min — ABNORMAL LOW (ref >60)
GFR, Estimated: 52 mL/min — ABNORMAL LOW (ref >60)
Glucose, Bld: 91 mg/dL (ref 70–99)
Potassium: 4.6 mmol/L (ref 3.5–5.1)
Sodium: 138 mmol/L (ref 135–145)
Total Bilirubin: 0.7 mg/dL (ref 0.3–1.2)
Total Protein: 6.7 g/dL (ref 6.5–8.1)

## 2020-01-25 LAB — CBC WITH DIFFERENTIAL (CANCER CENTER ONLY)
Abs Immature Granulocytes: 0.02 10*3/uL (ref 0.00–0.07)
Basophils Absolute: 0 10*3/uL (ref 0.0–0.1)
Basophils Relative: 0 %
Eosinophils Absolute: 0.1 10*3/uL (ref 0.0–0.5)
Eosinophils Relative: 3 %
HCT: 31.1 % — ABNORMAL LOW (ref 36.0–46.0)
Hemoglobin: 10 g/dL — ABNORMAL LOW (ref 12.0–15.0)
Immature Granulocytes: 1 %
Lymphocytes Relative: 28 %
Lymphs Abs: 1.1 10*3/uL (ref 0.7–4.0)
MCH: 28.2 pg (ref 26.0–34.0)
MCHC: 32.2 g/dL (ref 30.0–36.0)
MCV: 87.6 fL (ref 80.0–100.0)
Monocytes Absolute: 0.3 10*3/uL (ref 0.1–1.0)
Monocytes Relative: 8 %
Neutro Abs: 2.4 10*3/uL (ref 1.7–7.7)
Neutrophils Relative %: 60 %
Platelet Count: 70 10*3/uL — ABNORMAL LOW (ref 150–400)
RBC: 3.55 MIL/uL — ABNORMAL LOW (ref 3.87–5.11)
RDW: 15.9 % — ABNORMAL HIGH (ref 11.5–15.5)
WBC Count: 3.9 10*3/uL — ABNORMAL LOW (ref 4.0–10.5)
nRBC: 0 % (ref 0.0–0.2)

## 2020-01-25 MED ORDER — DASATINIB 80 MG PO TABS: 80.0000 mg | ORAL_TABLET | Freq: Every day | ORAL | 0 refills | Status: DC

## 2020-01-25 NOTE — Progress Notes (Signed)
Hematology and Oncology Follow Up Visit  Jodi Woods [id number] 10-24-1941 79 y.o. 01/25/2020 3:55 PM Lavone Orn, MDGriffin, Jodi Reichmann, MD   Principle Diagnosis: 79 year old woman with CML presented with a leukocytosis and thrombocytosis in September 2020.  She was found to have chronic phase with disease detected by FISH.     Current therapy: Dasatinib 100 mg daily started in February 2021.   Interim History: Jodi Woods is here for a follow-up visit.  Since the last visit, she started dasatinib and completed close to 1 month of therapy.  She did experience a few complications since her last visit including increased fluid retention, fatigue and decrease in her appetite.  Despite the decreased p.o. intake her weight remained about the same.  She denies any chest pain or shortness of breath.  She denies any easy bruising or bleeding complications.     Medications: Updated on review. Current Outpatient Medications  Medication Sig Dispense Refill  . aspirin EC 81 MG tablet Take 81 mg by mouth daily.    Marland Kitchen atorvastatin (LIPITOR) 10 MG tablet Take 10 mg by mouth daily.    . cholecalciferol (VITAMIN D3) 25 MCG (1000 UT) tablet Take 1,000 Units by mouth daily.    Marland Kitchen levothyroxine (SYNTHROID) 100 MCG tablet Take 100 mcg by mouth daily before breakfast.    . lisinopril-hydrochlorothiazide (ZESTORETIC) 20-12.5 MG tablet Take 1 tablet by mouth daily.    . SPRYCEL 100 MG tablet TAKE 1 TABLET (100 MG TOTAL) BY MOUTH DAILY. 30 tablet 0  . zinc gluconate 50 MG tablet Take 50 mg by mouth daily.     No current facility-administered medications for this visit.     Allergies:  Allergies  Allergen Reactions  . Norvasc [Amlodipine Besylate]       Physical Exam: Blood pressure (!) 161/52, pulse 80, temperature 98.2 F (36.8 C), temperature source Temporal, resp. rate 16, height 5\' 2"  (1.575 m), weight 164 lb 9.6 oz (74.7 kg), SpO2 99 %.   ECOG: 1    General appearance: Alert, awake without  any distress. Head: Atraumatic without abnormalities Oropharynx: Without any thrush or ulcers. Eyes: No scleral icterus.  Periorbital edema noted. Lymph nodes: No lymphadenopathy noted in the cervical, supraclavicular, or axillary nodes Heart:regular rate and rhythm, without any murmurs or gallops.   Lung: Clear to auscultation without any rhonchi, wheezes or dullness to percussion. Abdomin: Soft, nontender without any shifting dullness or ascites. Musculoskeletal: No clubbing or cyanosis. Neurological: No motor or sensory deficits. Skin: No rashes or lesions.     Lab Results: Lab Results  Component Value Date   WBC 16.6 (H) 12/17/2019   HGB 13.1 12/17/2019   HCT 41.2 12/17/2019   MCV 87.1 12/17/2019   PLT 558 (H) 12/17/2019     Chemistry      Component Value Date/Time   NA 140 08/11/2019 1118   K 4.8 08/11/2019 1118   CL 104 08/11/2019 1118   CO2 29 08/11/2019 1118   BUN 22 08/11/2019 1118   CREATININE 1.01 (H) 08/11/2019 1118      Component Value Date/Time   CALCIUM 9.6 08/11/2019 1118   ALKPHOS 60 08/11/2019 1118   AST 31 08/11/2019 1118   ALT 19 08/11/2019 1118   BILITOT 0.6 08/11/2019 1118          Impression and Plan:  79 year old woman with:  1.  CML presented with leukocytosis in September 2020.  He was found to have positive BCR/ABL by FISH criteria.    He is  currently on dasatinib and has completed close to a month of therapy.  The natural course of this disease and long-term complication associated with this treatment were reiterated.  GI toxicities, cytopenias as well as less fluid retention were reiterated.  Cardiac issues and hyper ischemia were also reviewed.  Laboratory data reviewed at this time and showed decline in her white cell count, hemoglobin and platelet count.  ANC remains normal with platelet count above 50,000.    Given the complications that she has accumulated I have recommended withholding treatment for the time being and resume  at a dose of 80 mg daily.  2.  Thrombocytosis: Resolved at this time and has developed thrombocytopenia instead.  This is related to her myeloproliferative disorder.  We will recheck her counts in 2 weeks to ensure adequate recovery.  3.  Follow-up: We will be in 4 weeks to follow her progress.   30  minutes were dedicated to the visit.  The time was spent on reviewing laboratory data, disease status update as well as addressing complications related to therapy.  Zola Button, MD 3/2/20213:55 PM

## 2020-01-26 ENCOUNTER — Telehealth: Payer: Self-pay

## 2020-01-26 ENCOUNTER — Telehealth: Payer: Self-pay | Admitting: Oncology

## 2020-01-26 NOTE — Telephone Encounter (Signed)
-----   Message from Wyatt Portela, MD sent at 01/26/2020 11:28 AM EST ----- That is correct.  The only additional recommendation is to update [place] about her symptoms prior to resuming the medication on March 9.  If she is still not 100% back to normal I might extend her break till March 16. ----- Message ----- From: Tami Lin, RN Sent: 01/26/2020  11:25 AM EST To: Wyatt Portela, MD  Patient called and wants to confirm that she is to hold Sprycel x 1 week and resume on 02/01/20 at a dose of 80 mg. She also wants to confirm that she does not need to hold the med until after her lab appointment on 3/16.  Lanelle Bal

## 2020-01-26 NOTE — Telephone Encounter (Signed)
Scheduled appt per 3/2 los.  Spoke with patient and she is aware of her appt dates and time.

## 2020-01-26 NOTE — Telephone Encounter (Signed)
Called patient and let her know that Dr. Hazeline Junker message below. Patient verbalized understanding of instructions.

## 2020-01-28 MED FILL — SPRYCEL 80 MG TABLET: 80 | 30 days supply | Qty: 30 | Fill #0

## 2020-02-03 ENCOUNTER — Telehealth: Payer: Self-pay | Admitting: Hematology & Oncology

## 2020-02-03 NOTE — Telephone Encounter (Signed)
I called and spoke with patient about appointments now being scheduled with Dr Marin Olp @ [facility].  She was pleased with how quickly we were able to get her scheduled.  I also went over our office policy along with directions to our facility as well.  I then called Alphonzo Grieve to advise her of this appointment. Per 3/10 staff message

## 2020-02-07 ENCOUNTER — Encounter: Payer: Self-pay | Admitting: Hematology & Oncology

## 2020-02-07 ENCOUNTER — Inpatient Hospital Stay: Payer: Medicare PPO

## 2020-02-07 ENCOUNTER — Inpatient Hospital Stay (HOSPITAL_BASED_OUTPATIENT_CLINIC_OR_DEPARTMENT_OTHER): Payer: Medicare PPO | Admitting: Hematology & Oncology

## 2020-02-07 ENCOUNTER — Other Ambulatory Visit: Payer: Self-pay

## 2020-02-07 VITALS — BP 162/59 | HR 67 | Temp 97.3°F | Resp 18 | Wt 162.0 lb

## 2020-02-07 DIAGNOSIS — D649 Anemia, unspecified: Secondary | ICD-10-CM | POA: Diagnosis not present

## 2020-02-07 DIAGNOSIS — R5383 Other fatigue: Secondary | ICD-10-CM | POA: Diagnosis not present

## 2020-02-07 DIAGNOSIS — D500 Iron deficiency anemia secondary to blood loss (chronic): Secondary | ICD-10-CM

## 2020-02-07 DIAGNOSIS — C9210 Chronic myeloid leukemia, BCR/ABL-positive, not having achieved remission: Secondary | ICD-10-CM | POA: Diagnosis not present

## 2020-02-07 DIAGNOSIS — R609 Edema, unspecified: Secondary | ICD-10-CM | POA: Diagnosis not present

## 2020-02-07 DIAGNOSIS — R7989 Other specified abnormal findings of blood chemistry: Secondary | ICD-10-CM | POA: Diagnosis not present

## 2020-02-07 DIAGNOSIS — E785 Hyperlipidemia, unspecified: Secondary | ICD-10-CM | POA: Diagnosis not present

## 2020-02-07 DIAGNOSIS — E039 Hypothyroidism, unspecified: Secondary | ICD-10-CM | POA: Diagnosis not present

## 2020-02-07 DIAGNOSIS — Z7982 Long term (current) use of aspirin: Secondary | ICD-10-CM | POA: Diagnosis not present

## 2020-02-07 DIAGNOSIS — D696 Thrombocytopenia, unspecified: Secondary | ICD-10-CM | POA: Diagnosis not present

## 2020-02-07 HISTORY — DX: Iron deficiency anemia secondary to blood loss (chronic): D50.0

## 2020-02-07 HISTORY — DX: Chronic myeloid leukemia, BCR/ABL-positive, not having achieved remission: C92.10

## 2020-02-07 LAB — CBC WITH DIFFERENTIAL (CANCER CENTER ONLY)
Abs Immature Granulocytes: 0.01 10*3/uL (ref 0.00–0.07)
Basophils Absolute: 0 10*3/uL (ref 0.0–0.1)
Basophils Relative: 0 %
Eosinophils Absolute: 0.2 10*3/uL (ref 0.0–0.5)
Eosinophils Relative: 3 %
HCT: 31.2 % — ABNORMAL LOW (ref 36.0–46.0)
Hemoglobin: 10 g/dL — ABNORMAL LOW (ref 12.0–15.0)
Immature Granulocytes: 0 %
Lymphocytes Relative: 59 %
Lymphs Abs: 3.2 10*3/uL (ref 0.7–4.0)
MCH: 28.3 pg (ref 26.0–34.0)
MCHC: 32.1 g/dL (ref 30.0–36.0)
MCV: 88.4 fL (ref 80.0–100.0)
Monocytes Absolute: 0.6 10*3/uL (ref 0.1–1.0)
Monocytes Relative: 10 %
Neutro Abs: 1.5 10*3/uL — ABNORMAL LOW (ref 1.7–7.7)
Neutrophils Relative %: 28 %
Platelet Count: 123 10*3/uL — ABNORMAL LOW (ref 150–400)
RBC: 3.53 MIL/uL — ABNORMAL LOW (ref 3.87–5.11)
RDW: 17.7 % — ABNORMAL HIGH (ref 11.5–15.5)
WBC Count: 5.5 10*3/uL (ref 4.0–10.5)
nRBC: 0 % (ref 0.0–0.2)

## 2020-02-07 LAB — CMP (CANCER CENTER ONLY)
ALT: 24 U/L (ref 0–44)
AST: 26 U/L (ref 15–41)
Albumin: 4.4 g/dL (ref 3.5–5.0)
Alkaline Phosphatase: 72 U/L (ref 38–126)
Anion gap: 5 (ref 5–15)
BUN: 25 mg/dL — ABNORMAL HIGH (ref 8–23)
CO2: 27 mmol/L (ref 22–32)
Calcium: 9.6 mg/dL (ref 8.9–10.3)
Chloride: 105 mmol/L (ref 98–111)
Creatinine: 1.07 mg/dL — ABNORMAL HIGH (ref 0.44–1.00)
GFR, Est AFR Am: 57 mL/min — ABNORMAL LOW (ref >60)
GFR, Estimated: 49 mL/min — ABNORMAL LOW (ref >60)
Glucose, Bld: 90 mg/dL (ref 70–99)
Potassium: 4.9 mmol/L (ref 3.5–5.1)
Sodium: 137 mmol/L (ref 135–145)
Total Bilirubin: 0.7 mg/dL (ref 0.3–1.2)
Total Protein: 6.8 g/dL (ref 6.5–8.1)

## 2020-02-07 LAB — SAVE SMEAR(SSMR), FOR PROVIDER SLIDE REVIEW

## 2020-02-07 NOTE — Progress Notes (Signed)
Hematology and Oncology Follow Up Visit  Jodi Woods [id number] 06-27-1941 79 y.o. 02/07/2020   Principle Diagnosis:   CML - Chronic Phase  Current Therapy:    Sprycel 80 mg po q day     Interim History:  Jodi Woods is in for first office visit.  She had been seen at the [facility].  However, she wished to be seen out at the [place].  She was recently placed on to Sprycel.  She started 100 mg daily.  This really dropped her blood counts.  She subsequently was off Sprycel for a little bit.  The dose was then decreased to 80 mg a day.  Apparently, the CML was diagnosed back in September.  She had a BCR/ABL of 70%.  She was followed.  She was then placed onto treatment in February.  She does feel tired.  She has had some iron deficiency in the past I think.  She has had no diarrhea.  She has had some fluid retention.  I am sure this is probably from the Sprycel.  I really think she needs an echocardiogram.  I like to check out cardiac function of patients before they go on to TKI therapy.  She has had no bleeding.  There is been no fever.  She has had occasional cough.  Her appetite is doing pretty well.  There is been no nausea or vomiting.  She has had no leg swelling.  Overall, her performance status is ECOG 1.  Medications:  Current Outpatient Medications:  .  aspirin EC 81 MG tablet, Take 81 mg by mouth daily., Disp: , Rfl:  .  atorvastatin (LIPITOR) 10 MG tablet, Take 10 mg by mouth daily., Disp: , Rfl:  .  cholecalciferol (VITAMIN D3) 25 MCG (1000 UT) tablet, Take 1,000 Units by mouth daily., Disp: , Rfl:  .  dasatinib (SPRYCEL) 80 MG tablet, Take 1 tablet (80 mg total) by mouth daily., Disp: 30 tablet, Rfl: 0 .  levothyroxine (SYNTHROID) 100 MCG tablet, Take 100 mcg by mouth daily before breakfast., Disp: , Rfl:  .  lisinopril-hydrochlorothiazide (ZESTORETIC) 20-12.5 MG tablet, Take 1 tablet by mouth daily., Disp: , Rfl:  .   zinc gluconate 50 MG tablet, Take 50 mg by mouth daily., Disp: , Rfl:   Allergies:  Allergies  Allergen Reactions  . Norvasc [Amlodipine Besylate]     Past Medical History, Surgical history, Social history, and Family History were reviewed and updated.  Review of Systems: Review of Systems  Constitutional: Negative.   HENT:  Negative.   Eyes: Negative.   Respiratory: Negative.   Cardiovascular: Negative.   Gastrointestinal: Negative.   Endocrine: Negative.   Genitourinary: Negative.    Musculoskeletal: Negative.   Skin: Negative.   Neurological: Negative.   Hematological: Negative.   Psychiatric/Behavioral: Negative.     Physical Exam:  weight is 162 lb (73.5 kg). Her temporal temperature is 97.3 F (36.3 C) (abnormal). Her blood pressure is 162/59 (abnormal) and her pulse is 67. Her respiration is 18 and oxygen saturation is 100%.   Wt Readings from Last 3 Encounters:  02/07/20 162 lb (73.5 kg)  01/25/20 164 lb 9.6 oz (74.7 kg)  12/24/19 162 lb 14.4 oz (73.9 kg)    Physical Exam Vitals reviewed.  HENT:     Head: Normocephalic and atraumatic.  Eyes:     Pupils: Pupils are equal, round, and reactive to light.  Cardiovascular:     Rate and Rhythm: Normal  rate and regular rhythm.     Heart sounds: Normal heart sounds.  Pulmonary:     Effort: Pulmonary effort is normal.     Breath sounds: Normal breath sounds.  Abdominal:     General: Bowel sounds are normal.     Palpations: Abdomen is soft.  Musculoskeletal:        General: No tenderness or deformity. Normal range of motion.     Cervical back: Normal range of motion.  Lymphadenopathy:     Cervical: No cervical adenopathy.  Skin:    General: Skin is warm and dry.     Findings: No erythema or rash.  Neurological:     Mental Status: She is alert and oriented to person, place, and time.  Psychiatric:        Behavior: Behavior normal.        Thought Content: Thought content normal.        Judgment: Judgment  normal.      Lab Results  Component Value Date   WBC 5.5 02/07/2020   HGB 10.0 (L) 02/07/2020   HCT 31.2 (L) 02/07/2020   MCV 88.4 02/07/2020   PLT 123 (L) 02/07/2020     Chemistry      Component Value Date/Time   NA 137 02/07/2020 1142   K 4.9 02/07/2020 1142   CL 105 02/07/2020 1142   CO2 27 02/07/2020 1142   BUN 25 (H) 02/07/2020 1142   CREATININE 1.07 (H) 02/07/2020 1142      Component Value Date/Time   CALCIUM 9.6 02/07/2020 1142   ALKPHOS 72 02/07/2020 1142   AST 26 02/07/2020 1142   ALT 24 02/07/2020 1142   BILITOT 0.7 02/07/2020 1142      Impression and Plan: Jodi Woods is a a very charming 79 year old white female.  She has chronic phase CML.  I believe that she is responding.  We will have to see what the BCR/ABL ratio is today.  I look at her blood smear under the microscope.  I do not see any immature myeloid cells.  I do not see any nucleated red blood cells.  Platelets were slightly decreased in number.  They were better then a couple weeks ago.  It is possible and likely that Jodi Woods will not need a full dose of Sprycel.  We may try to decrease her dose if we can depending on what the BCR/ABL ratio is.  Again, I would like to get a echocardiogram on her just to make sure her cardiac function is doing okay.  I spent about 45 minutes with she and her husband.  They are both very nice.  I did give her a prayer blanket which she very much appreciated.  I noted that she is a little anemic.  Again we will see what her iron studies show.  It is possible that she may need some iron to try to help with her energy.  I would like to get her back in 6 weeks for follow-up.  If she just cannot tolerate the Sprycel, we definitely have other agents that we can try.   Volanda Napoleon, MD 3/15/20215:14 PM

## 2020-02-08 ENCOUNTER — Telehealth: Payer: Self-pay | Admitting: Hematology & Oncology

## 2020-02-08 ENCOUNTER — Other Ambulatory Visit: Payer: Medicare PPO

## 2020-02-08 ENCOUNTER — Telehealth: Payer: Self-pay | Admitting: *Deleted

## 2020-02-08 LAB — IRON AND TIBC
Iron: 106 ug/dL (ref 41–142)
Saturation Ratios: 34 % (ref 21–57)
TIBC: 313 ug/dL (ref 236–444)
UIBC: 207 ug/dL (ref 120–384)

## 2020-02-08 LAB — LACTATE DEHYDROGENASE: LDH: 227 U/L — ABNORMAL HIGH (ref 98–192)

## 2020-02-08 LAB — FERRITIN: Ferritin: 313 ng/mL — ABNORMAL HIGH (ref 11–307)

## 2020-02-08 NOTE — Telephone Encounter (Signed)
-----   Message from Volanda Napoleon, MD sent at 02/08/2020 11:52 AM EDT ----- Call - the iron level is actually pretty good!!  Laurey Arrow

## 2020-02-08 NOTE — Telephone Encounter (Signed)
LMVM regarding appointments/ calendar mailed per 3/15 los

## 2020-02-08 NOTE — Telephone Encounter (Signed)
Patient notified per order of Dr. Marin Olp that "the iron level is actually pretty good!!"  Pt appreciative of call and has no questions or concerns at this time.  Upcoming schedule reviewed with patient.

## 2020-02-10 DIAGNOSIS — H53411 Scotoma involving central area, right eye: Secondary | ICD-10-CM | POA: Diagnosis not present

## 2020-02-10 DIAGNOSIS — H524 Presbyopia: Secondary | ICD-10-CM | POA: Diagnosis not present

## 2020-02-10 DIAGNOSIS — H53462 Homonymous bilateral field defects, left side: Secondary | ICD-10-CM | POA: Diagnosis not present

## 2020-02-10 DIAGNOSIS — H5310 Unspecified subjective visual disturbances: Secondary | ICD-10-CM | POA: Diagnosis not present

## 2020-02-11 LAB — BCR/ABL

## 2020-02-15 ENCOUNTER — Other Ambulatory Visit: Payer: Self-pay | Admitting: Hematology & Oncology

## 2020-02-17 DIAGNOSIS — D2371 Other benign neoplasm of skin of right lower limb, including hip: Secondary | ICD-10-CM | POA: Diagnosis not present

## 2020-02-17 DIAGNOSIS — M21612 Bunion of left foot: Secondary | ICD-10-CM | POA: Diagnosis not present

## 2020-02-17 DIAGNOSIS — M21611 Bunion of right foot: Secondary | ICD-10-CM | POA: Diagnosis not present

## 2020-02-17 DIAGNOSIS — D2372 Other benign neoplasm of skin of left lower limb, including hip: Secondary | ICD-10-CM | POA: Diagnosis not present

## 2020-02-21 ENCOUNTER — Telehealth: Payer: Self-pay | Admitting: *Deleted

## 2020-02-21 ENCOUNTER — Ambulatory Visit: Payer: Medicare PPO | Admitting: Oncology

## 2020-02-21 ENCOUNTER — Other Ambulatory Visit: Payer: Medicare PPO

## 2020-02-21 NOTE — Telephone Encounter (Signed)
Call placed back to patient and patient notified per order of Dr. Marin Olp to stop Sprycel at this time until next appt with Dr. Marin Olp on 03/20/20.  Teach back done.  Pt appreciative of call back and states that she will stop Sprycel at this time.

## 2020-02-21 NOTE — Telephone Encounter (Signed)
Call received from patient stating that she has "increased fatigue, weakness, headache, slight nausea, her taste buds are off, loose stools three times a day, small red bumps on face and facial flushing.  Informed pt that I would speak with Dr. Marin Olp and call her back with MD orders.

## 2020-02-23 ENCOUNTER — Other Ambulatory Visit: Payer: Self-pay

## 2020-02-23 ENCOUNTER — Ambulatory Visit (HOSPITAL_COMMUNITY): Payer: Medicare PPO | Attending: Cardiovascular Disease

## 2020-02-23 DIAGNOSIS — C9210 Chronic myeloid leukemia, BCR/ABL-positive, not having achieved remission: Secondary | ICD-10-CM | POA: Diagnosis not present

## 2020-02-23 DIAGNOSIS — I071 Rheumatic tricuspid insufficiency: Secondary | ICD-10-CM | POA: Insufficient documentation

## 2020-02-24 ENCOUNTER — Telehealth: Payer: Self-pay | Admitting: *Deleted

## 2020-02-24 NOTE — Telephone Encounter (Signed)
-----   Message from Volanda Napoleon, MD sent at 02/23/2020  4:17 PM EDT ----- Call - the echo shows that your heart is pumping like a champion!!  Laurey Arrow

## 2020-02-24 NOTE — Telephone Encounter (Signed)
As noted below by Dr. Marin Olp, I informed the patient that her heart is pumping like a champion. She verbalized understanding. Patient asked,"since I'm off Sprycel until I see Dr. Marin Olp again, can I go ahead and have my dental procedure done? They are going to drill a small hole and put a post in my tooth. Then cover it with a crown." Per Dr. Marin Olp, this is a great time to have the dental procedure. Patient verbalized understanding. I told the patient to ask the dentist if he wanted a CBC drawn closer to the procedure date, so , we could check her platelet count. She said she would check with the dentist and call the office back.

## 2020-03-20 ENCOUNTER — Telehealth: Payer: Self-pay | Admitting: Hematology & Oncology

## 2020-03-20 ENCOUNTER — Inpatient Hospital Stay: Payer: Medicare PPO | Attending: Oncology

## 2020-03-20 ENCOUNTER — Encounter: Payer: Self-pay | Admitting: Hematology & Oncology

## 2020-03-20 ENCOUNTER — Inpatient Hospital Stay (HOSPITAL_BASED_OUTPATIENT_CLINIC_OR_DEPARTMENT_OTHER): Payer: Medicare PPO | Admitting: Hematology & Oncology

## 2020-03-20 ENCOUNTER — Other Ambulatory Visit: Payer: Self-pay

## 2020-03-20 VITALS — BP 169/49 | HR 69 | Temp 97.8°F | Resp 18 | Wt 158.0 lb

## 2020-03-20 DIAGNOSIS — I10 Essential (primary) hypertension: Secondary | ICD-10-CM | POA: Diagnosis not present

## 2020-03-20 DIAGNOSIS — C9210 Chronic myeloid leukemia, BCR/ABL-positive, not having achieved remission: Secondary | ICD-10-CM | POA: Diagnosis not present

## 2020-03-20 DIAGNOSIS — Z1389 Encounter for screening for other disorder: Secondary | ICD-10-CM | POA: Diagnosis not present

## 2020-03-20 DIAGNOSIS — Z0000 Encounter for general adult medical examination without abnormal findings: Secondary | ICD-10-CM | POA: Diagnosis not present

## 2020-03-20 DIAGNOSIS — Z79899 Other long term (current) drug therapy: Secondary | ICD-10-CM | POA: Insufficient documentation

## 2020-03-20 DIAGNOSIS — E039 Hypothyroidism, unspecified: Secondary | ICD-10-CM | POA: Diagnosis not present

## 2020-03-20 DIAGNOSIS — E7800 Pure hypercholesterolemia, unspecified: Secondary | ICD-10-CM | POA: Diagnosis not present

## 2020-03-20 DIAGNOSIS — Z23 Encounter for immunization: Secondary | ICD-10-CM | POA: Diagnosis not present

## 2020-03-20 DIAGNOSIS — D500 Iron deficiency anemia secondary to blood loss (chronic): Secondary | ICD-10-CM

## 2020-03-20 DIAGNOSIS — J449 Chronic obstructive pulmonary disease, unspecified: Secondary | ICD-10-CM | POA: Diagnosis not present

## 2020-03-20 LAB — CMP (CANCER CENTER ONLY)
ALT: 13 U/L (ref 0–44)
AST: 19 U/L (ref 15–41)
Albumin: 4.1 g/dL (ref 3.5–5.0)
Alkaline Phosphatase: 58 U/L (ref 38–126)
Anion gap: 5 (ref 5–15)
BUN: 32 mg/dL — ABNORMAL HIGH (ref 8–23)
CO2: 29 mmol/L (ref 22–32)
Calcium: 9.8 mg/dL (ref 8.9–10.3)
Chloride: 106 mmol/L (ref 98–111)
Creatinine: 1.02 mg/dL — ABNORMAL HIGH (ref 0.44–1.00)
GFR, Est AFR Am: >60 mL/min (ref >60)
GFR, Estimated: 52 mL/min — ABNORMAL LOW (ref >60)
Glucose, Bld: 103 mg/dL — ABNORMAL HIGH (ref 70–99)
Potassium: 4.7 mmol/L (ref 3.5–5.1)
Sodium: 140 mmol/L (ref 135–145)
Total Bilirubin: 0.6 mg/dL (ref 0.3–1.2)
Total Protein: 6.8 g/dL (ref 6.5–8.1)

## 2020-03-20 LAB — CBC WITH DIFFERENTIAL (CANCER CENTER ONLY)
Abs Immature Granulocytes: 0.01 10*3/uL (ref 0.00–0.07)
Basophils Absolute: 0 10*3/uL (ref 0.0–0.1)
Basophils Relative: 0 %
Eosinophils Absolute: 0.3 10*3/uL (ref 0.0–0.5)
Eosinophils Relative: 6 %
HCT: 33.2 % — ABNORMAL LOW (ref 36.0–46.0)
Hemoglobin: 10.9 g/dL — ABNORMAL LOW (ref 12.0–15.0)
Immature Granulocytes: 0 %
Lymphocytes Relative: 31 %
Lymphs Abs: 1.5 10*3/uL (ref 0.7–4.0)
MCH: 30.6 pg (ref 26.0–34.0)
MCHC: 32.8 g/dL (ref 30.0–36.0)
MCV: 93.3 fL (ref 80.0–100.0)
Monocytes Absolute: 0.5 10*3/uL (ref 0.1–1.0)
Monocytes Relative: 10 %
Neutro Abs: 2.6 10*3/uL (ref 1.7–7.7)
Neutrophils Relative %: 53 %
Platelet Count: 151 10*3/uL (ref 150–400)
RBC: 3.56 MIL/uL — ABNORMAL LOW (ref 3.87–5.11)
RDW: 18.2 % — ABNORMAL HIGH (ref 11.5–15.5)
WBC Count: 4.9 10*3/uL (ref 4.0–10.5)
nRBC: 0 % (ref 0.0–0.2)

## 2020-03-20 LAB — SAVE SMEAR(SSMR), FOR PROVIDER SLIDE REVIEW

## 2020-03-20 LAB — FERRITIN: Ferritin: 130 ng/mL (ref 11–307)

## 2020-03-20 LAB — IRON AND TIBC
Iron: 70 ug/dL (ref 41–142)
Saturation Ratios: 23 % (ref 21–57)
TIBC: 299 ug/dL (ref 236–444)
UIBC: 229 ug/dL (ref 120–384)

## 2020-03-20 LAB — LACTATE DEHYDROGENASE: LDH: 191 U/L (ref 98–192)

## 2020-03-20 MED ORDER — DASATINIB 50 MG PO TABS
50.0000 mg | ORAL_TABLET | Freq: Every day | ORAL | 8 refills | Status: DC
Start: 2020-03-20 — End: 2020-05-01

## 2020-03-20 MED FILL — SPRYCEL 50 MG TABLET: 50 | 30 days supply | Qty: 30 | Fill #0

## 2020-03-20 NOTE — Progress Notes (Signed)
Hematology and Oncology Follow Up Visit  Ellajane Eyestone [id number] 06-04-1941 79 y.o. 03/20/2020   Principle Diagnosis:   CML - Chronic Phase  Current Therapy:    Sprycel 50 mg po q day -- start on 03/24/2020     Interim History:  Ms. Funke is in for first office visit.  We have had her off the Sprycel now for a month.  She feels somewhat better.  We last saw her in March, her BCR/ABL ratio was not 19.8%.  I am sure that it is better now.  The problem is whether not she can tolerate Sprycel at a lower dose.  We will try her on the 50 mg dose and see how she does.  She has not retain fluid.  We did do an echocardiogram on her.  This was done on 02/23/2020.  This showed a left ventricular ejection fraction of 60-65%.  There is no valvular issues.  There is good right ventricular function.  She has had no cough or shortness of breath.  She has been busy doing work outside of the house.  She has had no problems with diarrhea.  She has had no abdominal pain.  She has had no myalgias or arthralgias.  There is been no leg swelling.  Overall, I would say her performance status is ECOG 0.    Medications:  Current Outpatient Medications:  .  aspirin EC 81 MG tablet, Take 81 mg by mouth daily., Disp: , Rfl:  .  atorvastatin (LIPITOR) 10 MG tablet, Take 10 mg by mouth daily., Disp: , Rfl:  .  cholecalciferol (VITAMIN D3) 25 MCG (1000 UT) tablet, Take 1,000 Units by mouth daily., Disp: , Rfl:  .  dasatinib (SPRYCEL) 80 MG tablet, Take 1 tablet (80 mg total) by mouth daily., Disp: 30 tablet, Rfl: 0 .  levothyroxine (SYNTHROID) 100 MCG tablet, Take 100 mcg by mouth daily before breakfast., Disp: , Rfl:  .  lisinopril-hydrochlorothiazide (ZESTORETIC) 20-12.5 MG tablet, Take 1 tablet by mouth daily., Disp: , Rfl:   Allergies:  Allergies  Allergen Reactions  . Norvasc [Amlodipine Besylate]     Past Medical History, Surgical history, Social history, and Family History were reviewed and  updated.  Review of Systems: Review of Systems  Constitutional: Negative.   HENT:  Negative.   Eyes: Negative.   Respiratory: Negative.   Cardiovascular: Negative.   Gastrointestinal: Negative.   Endocrine: Negative.   Genitourinary: Negative.    Musculoskeletal: Negative.   Skin: Negative.   Neurological: Negative.   Hematological: Negative.   Psychiatric/Behavioral: Negative.     Physical Exam:  weight is 158 lb (71.7 kg). Her temporal temperature is 97.8 F (36.6 C). Her blood pressure is 169/49 (abnormal) and her pulse is 69. Her respiration is 18 and oxygen saturation is 100%.   Wt Readings from Last 3 Encounters:  03/20/20 158 lb (71.7 kg)  02/07/20 162 lb (73.5 kg)  01/25/20 164 lb 9.6 oz (74.7 kg)    Physical Exam Vitals reviewed.  HENT:     Head: Normocephalic and atraumatic.  Eyes:     Pupils: Pupils are equal, round, and reactive to light.  Cardiovascular:     Rate and Rhythm: Normal rate and regular rhythm.     Heart sounds: Normal heart sounds.  Pulmonary:     Effort: Pulmonary effort is normal.     Breath sounds: Normal breath sounds.  Abdominal:     General: Bowel sounds are normal.     Palpations: Abdomen  is soft.  Musculoskeletal:        General: No tenderness or deformity. Normal range of motion.     Cervical back: Normal range of motion.  Lymphadenopathy:     Cervical: No cervical adenopathy.  Skin:    General: Skin is warm and dry.     Findings: No erythema or rash.  Neurological:     Mental Status: She is alert and oriented to person, place, and time.  Psychiatric:        Behavior: Behavior normal.        Thought Content: Thought content normal.        Judgment: Judgment normal.      Lab Results  Component Value Date   WBC 4.9 03/20/2020   HGB 10.9 (L) 03/20/2020   HCT 33.2 (L) 03/20/2020   MCV 93.3 03/20/2020   PLT 151 03/20/2020     Chemistry      Component Value Date/Time   NA 140 03/20/2020 0843   K 4.7 03/20/2020 0843    CL 106 03/20/2020 0843   CO2 29 03/20/2020 0843   BUN 32 (H) 03/20/2020 0843   CREATININE 1.02 (H) 03/20/2020 0843      Component Value Date/Time   CALCIUM 9.8 03/20/2020 0843   ALKPHOS 58 03/20/2020 0843   AST 19 03/20/2020 0843   ALT 13 03/20/2020 0843   BILITOT 0.6 03/20/2020 0843      Impression and Plan: Ms. Gebhardt is a a very charming 79 year old white female.  She has chronic phase CML.  I believe that she is responding.  We will have to see what the BCR/ABL ratio is today.  I looked at her blood smear under the microscope.  I do not see any immature myeloid cells.  I do not see any nucleated red blood cells.  Platelets were adequate in number and size.  Platelets were well granulated.  They were better then a couple weeks ago.  Hopefully, she will tolerate the 50 mg dose.  If not, I am sure we can try one of the other TKI drugs.  We will plan to get her back in about 6 weeks again.  Thankfully, her daughter who is a nurse came in with her today.  I answered all of their questions.   Volanda Napoleon, MD 4/26/20219:36 AM

## 2020-03-20 NOTE — Telephone Encounter (Signed)
Appointments scheduled calendar printed and My Chart access has been sent to patient as well per 4/26 los

## 2020-03-31 ENCOUNTER — Other Ambulatory Visit: Payer: Self-pay | Admitting: Hematology & Oncology

## 2020-03-31 ENCOUNTER — Telehealth: Payer: Self-pay | Admitting: Hematology & Oncology

## 2020-03-31 LAB — BCR/ABL

## 2020-03-31 NOTE — Telephone Encounter (Signed)
I left Ms. Pask a message on her cell phone.  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  Her molecular marker went from 20% down to 0.4%.  This is a very good decrease and should indicate a very good outcome.  I wish to a very nice Mother's Day weekend.  If there is any question that she has she can always give [place] a call back.  Lattie Haw, MD

## 2020-04-13 ENCOUNTER — Telehealth: Payer: Self-pay | Admitting: *Deleted

## 2020-04-13 NOTE — Telephone Encounter (Signed)
Message received from patient wanting to know if it is ok for her to take Amoxicillin for a dental procedure along with her Dasatinib.  Call placed back to patient and patient notified per order of [facility] Pharmacist that it is ok for her to take Amoxicillin and Dasatinib together.  Pt appreciative of call back and has no further questions at this time.

## 2020-05-01 ENCOUNTER — Inpatient Hospital Stay (HOSPITAL_BASED_OUTPATIENT_CLINIC_OR_DEPARTMENT_OTHER): Payer: Medicare PPO | Admitting: Hematology & Oncology

## 2020-05-01 ENCOUNTER — Inpatient Hospital Stay: Payer: Medicare PPO | Attending: Oncology

## 2020-05-01 ENCOUNTER — Other Ambulatory Visit: Payer: Self-pay

## 2020-05-01 ENCOUNTER — Telehealth: Payer: Self-pay | Admitting: Hematology & Oncology

## 2020-05-01 ENCOUNTER — Encounter: Payer: Self-pay | Admitting: Hematology & Oncology

## 2020-05-01 VITALS — BP 160/54 | HR 65 | Temp 96.6°F | Resp 16 | Wt 156.0 lb

## 2020-05-01 DIAGNOSIS — C9210 Chronic myeloid leukemia, BCR/ABL-positive, not having achieved remission: Secondary | ICD-10-CM

## 2020-05-01 DIAGNOSIS — Z79899 Other long term (current) drug therapy: Secondary | ICD-10-CM | POA: Insufficient documentation

## 2020-05-01 LAB — CMP (CANCER CENTER ONLY)
ALT: 14 U/L (ref 0–44)
AST: 20 U/L (ref 15–41)
Albumin: 4.3 g/dL (ref 3.5–5.0)
Alkaline Phosphatase: 61 U/L (ref 38–126)
Anion gap: 7 (ref 5–15)
BUN: 24 mg/dL — ABNORMAL HIGH (ref 8–23)
CO2: 29 mmol/L (ref 22–32)
Calcium: 10 mg/dL (ref 8.9–10.3)
Chloride: 104 mmol/L (ref 98–111)
Creatinine: 0.99 mg/dL (ref 0.44–1.00)
GFR, Est AFR Am: >60 mL/min (ref >60)
GFR, Estimated: 54 mL/min — ABNORMAL LOW (ref >60)
Glucose, Bld: 96 mg/dL (ref 70–99)
Potassium: 4.1 mmol/L (ref 3.5–5.1)
Sodium: 140 mmol/L (ref 135–145)
Total Bilirubin: 0.6 mg/dL (ref 0.3–1.2)
Total Protein: 6.9 g/dL (ref 6.5–8.1)

## 2020-05-01 LAB — CBC WITH DIFFERENTIAL (CANCER CENTER ONLY)
Abs Immature Granulocytes: 0.02 10*3/uL (ref 0.00–0.07)
Basophils Absolute: 0 10*3/uL (ref 0.0–0.1)
Basophils Relative: 0 %
Eosinophils Absolute: 0.2 10*3/uL (ref 0.0–0.5)
Eosinophils Relative: 4 %
HCT: 35.5 % — ABNORMAL LOW (ref 36.0–46.0)
Hemoglobin: 11.6 g/dL — ABNORMAL LOW (ref 12.0–15.0)
Immature Granulocytes: 0 %
Lymphocytes Relative: 33 %
Lymphs Abs: 1.9 10*3/uL (ref 0.7–4.0)
MCH: 31.9 pg (ref 26.0–34.0)
MCHC: 32.7 g/dL (ref 30.0–36.0)
MCV: 97.5 fL (ref 80.0–100.0)
Monocytes Absolute: 0.4 10*3/uL (ref 0.1–1.0)
Monocytes Relative: 8 %
Neutro Abs: 3.2 10*3/uL (ref 1.7–7.7)
Neutrophils Relative %: 55 %
Platelet Count: 189 10*3/uL (ref 150–400)
RBC: 3.64 MIL/uL — ABNORMAL LOW (ref 3.87–5.11)
RDW: 13.9 % (ref 11.5–15.5)
WBC Count: 5.9 10*3/uL (ref 4.0–10.5)
nRBC: 0 % (ref 0.0–0.2)

## 2020-05-01 LAB — LACTATE DEHYDROGENASE: LDH: 200 U/L — ABNORMAL HIGH (ref 98–192)

## 2020-05-01 LAB — SAVE SMEAR(SSMR), FOR PROVIDER SLIDE REVIEW

## 2020-05-01 MED ORDER — DASATINIB 50 MG PO TABS: 50.0000 mg | ORAL_TABLET | Freq: Every day | ORAL | 8 refills | Status: DC

## 2020-05-01 NOTE — Telephone Encounter (Signed)
Appointments scheduled calendar printed & mailed per 6/7 los

## 2020-05-01 NOTE — Progress Notes (Signed)
Hematology and Oncology Follow Up Visit  Jodi Woods [id number] 02-29-1941 79 y.o. 05/01/2020   Principle Diagnosis:   CML - Chronic Phase  Current Therapy:    Sprycel 50 mg po q other day -- change on 05/01/2020     Interim History:  Jodi Woods is in for follow-up.  She is doing pretty well.  Unfortunately, she stopped the Sprycel again.  I am not sure she was having problems with this.  However, I thought it would be reasonable to hold off until we saw her back.  Unfortunately her husband died suddenly on 04-28-23.  She certainly seems to be taking it pretty nicely.  We did do her BCR/ABL studies last time she was here.  The ratio was down to 0.4%.  Clearly, the Sprycel is working.  Is just a matter of try to get her to tolerate it.  We will see if she does okay with every other day Sprycel.  She has had no problems with cough.  She has had no nausea or vomiting.  There is been no change in bowel or bladder habits.  She has had no rashes.  There has been no leg swelling.  Overall, her performance status is ECOG 1.       Medications:  Current Outpatient Medications:  .  aspirin EC 81 MG tablet, Take 81 mg by mouth daily., Disp: , Rfl:  .  atorvastatin (LIPITOR) 10 MG tablet, Take 10 mg by mouth daily., Disp: , Rfl:  .  cholecalciferol (VITAMIN D3) 25 MCG (1000 UT) tablet, Take 1,000 Units by mouth daily., Disp: , Rfl:  .  dasatinib (SPRYCEL) 50 MG tablet, Take 1 tablet (50 mg total) by mouth daily., Disp: 30 tablet, Rfl: 8 .  levothyroxine (SYNTHROID) 100 MCG tablet, Take 100 mcg by mouth daily before breakfast., Disp: , Rfl:  .  lisinopril-hydrochlorothiazide (ZESTORETIC) 20-12.5 MG tablet, Take 1 tablet by mouth daily., Disp: , Rfl:   Allergies:  Allergies  Allergen Reactions  . Norvasc [Amlodipine Besylate]     Past Medical History, Surgical history, Social history, and Family History were reviewed and updated.  Review of Systems: Review of Systems  Constitutional:  Negative.   HENT:  Negative.   Eyes: Negative.   Respiratory: Negative.   Cardiovascular: Negative.   Gastrointestinal: Negative.   Endocrine: Negative.   Genitourinary: Negative.    Musculoskeletal: Negative.   Skin: Negative.   Neurological: Negative.   Hematological: Negative.   Psychiatric/Behavioral: Negative.     Physical Exam:  weight is 156 lb (70.8 kg). Her temporal temperature is 96.6 F (35.9 C) (abnormal). Her blood pressure is 160/54 (abnormal) and her pulse is 65. Her respiration is 16 and oxygen saturation is 99%.   Wt Readings from Last 3 Encounters:  05/01/20 156 lb (70.8 kg)  03/20/20 158 lb (71.7 kg)  02/07/20 162 lb (73.5 kg)    Physical Exam Vitals reviewed.  HENT:     Head: Normocephalic and atraumatic.  Eyes:     Pupils: Pupils are equal, round, and reactive to light.  Cardiovascular:     Rate and Rhythm: Normal rate and regular rhythm.     Heart sounds: Normal heart sounds.  Pulmonary:     Effort: Pulmonary effort is normal.     Breath sounds: Normal breath sounds.  Abdominal:     General: Bowel sounds are normal.     Palpations: Abdomen is soft.  Musculoskeletal:        General: No tenderness  or deformity. Normal range of motion.     Cervical back: Normal range of motion.  Lymphadenopathy:     Cervical: No cervical adenopathy.  Skin:    General: Skin is warm and dry.     Findings: No erythema or rash.  Neurological:     Mental Status: She is alert and oriented to person, place, and time.  Psychiatric:        Behavior: Behavior normal.        Thought Content: Thought content normal.        Judgment: Judgment normal.      Lab Results  Component Value Date   WBC 5.9 05/01/2020   HGB 11.6 (L) 05/01/2020   HCT 35.5 (L) 05/01/2020   MCV 97.5 05/01/2020   PLT 189 05/01/2020     Chemistry      Component Value Date/Time   NA 140 05/01/2020 1203   K 4.1 05/01/2020 1203   CL 104 05/01/2020 1203   CO2 29 05/01/2020 1203   BUN 24  (H) 05/01/2020 1203   CREATININE 0.99 05/01/2020 1203      Component Value Date/Time   CALCIUM 10.0 05/01/2020 1203   ALKPHOS 61 05/01/2020 1203   AST 20 05/01/2020 1203   ALT 14 05/01/2020 1203   BILITOT 0.6 05/01/2020 1203      Impression and Plan: Jodi Woods is a a very charming 79 year old white female.  She has chronic phase CML.  I believe that she is responding.  We will have to see what the BCR/ABL ratio is today.  I looked at her blood smear under the microscope.  I do not see any immature myeloid cells.  I do not see any nucleated red blood cells.  Platelets were adequate in number and size.  Platelets were well granulated.  They were better then a couple weeks ago.  Hopefully, she will tolerate the every other day 50 mg dose of the Sprycel.  If not, I am sure we can try one of the other TKI drugs.  At this point, we will try to get her through the summertime.  I will get her back right before Labor Day.  I know she is planning on going to the beach for part of the summer.    Volanda Napoleon, MD 6/7/202112:59 PM

## 2020-05-09 LAB — BCR/ABL

## 2020-05-10 ENCOUNTER — Telehealth: Payer: Self-pay | Admitting: *Deleted

## 2020-05-10 NOTE — Telephone Encounter (Signed)
Patient notified per order of Dr. Marin Olp that "the [facility] is almost in remission!!!  Keep taking the pill!!!"  Pt appreciative of call and has no questions at this time.

## 2020-05-10 NOTE — Telephone Encounter (Signed)
-----   Message from Volanda Napoleon, MD sent at 05/10/2020  7:04 AM EDT ----- Call - the CML is almost in remission!!!  Keep taking the pill!!!  Laurey Arrow

## 2020-05-16 ENCOUNTER — Encounter: Payer: Self-pay | Admitting: Hematology & Oncology

## 2020-06-19 MED FILL — SPRYCEL 50 MG TABLET: 50 | 30 days supply | Qty: 30 | Fill #2

## 2020-06-20 DIAGNOSIS — Z1231 Encounter for screening mammogram for malignant neoplasm of breast: Secondary | ICD-10-CM | POA: Diagnosis not present

## 2020-07-12 DIAGNOSIS — R490 Dysphonia: Secondary | ICD-10-CM | POA: Diagnosis not present

## 2020-07-13 DIAGNOSIS — H3500 Unspecified background retinopathy: Secondary | ICD-10-CM | POA: Diagnosis not present

## 2020-07-13 DIAGNOSIS — H4720 Unspecified optic atrophy: Secondary | ICD-10-CM | POA: Diagnosis not present

## 2020-07-13 DIAGNOSIS — H53411 Scotoma involving central area, right eye: Secondary | ICD-10-CM | POA: Diagnosis not present

## 2020-07-17 ENCOUNTER — Telehealth: Payer: Self-pay | Admitting: *Deleted

## 2020-07-17 NOTE — Telephone Encounter (Signed)
Call received from patient stating that she started Pepcid on Saturday ordered by Dr. Laurann Montana and that last night she had nausea, vomiting and diarrhea and is concerned that the Pepcid is interacting with the Sprycel.  Pt instructed to hold Pepcid at this time until she receives a return call from Dr. Laurann Montana regarding Pepcid and to continue Sprycel as ordered by Dr. Marin Olp. Pt appreciative of call and has no further questions at this time.

## 2020-07-25 ENCOUNTER — Inpatient Hospital Stay (HOSPITAL_BASED_OUTPATIENT_CLINIC_OR_DEPARTMENT_OTHER): Payer: Medicare PPO | Admitting: Hematology & Oncology

## 2020-07-25 ENCOUNTER — Other Ambulatory Visit: Payer: Self-pay

## 2020-07-25 ENCOUNTER — Encounter: Payer: Self-pay | Admitting: Hematology & Oncology

## 2020-07-25 ENCOUNTER — Inpatient Hospital Stay: Payer: Medicare PPO | Attending: Oncology

## 2020-07-25 VITALS — BP 178/68 | HR 60 | Temp 99.0°F | Resp 18 | Ht 62.0 in | Wt 160.0 lb

## 2020-07-25 DIAGNOSIS — C9210 Chronic myeloid leukemia, BCR/ABL-positive, not having achieved remission: Secondary | ICD-10-CM | POA: Diagnosis not present

## 2020-07-25 DIAGNOSIS — Z79899 Other long term (current) drug therapy: Secondary | ICD-10-CM | POA: Insufficient documentation

## 2020-07-25 LAB — SAVE SMEAR(SSMR), FOR PROVIDER SLIDE REVIEW

## 2020-07-25 LAB — CBC WITH DIFFERENTIAL (CANCER CENTER ONLY)
Abs Immature Granulocytes: 0.02 10*3/uL (ref 0.00–0.07)
Basophils Absolute: 0 10*3/uL (ref 0.0–0.1)
Basophils Relative: 0 %
Eosinophils Absolute: 0.4 10*3/uL (ref 0.0–0.5)
Eosinophils Relative: 5 %
HCT: 32.7 % — ABNORMAL LOW (ref 36.0–46.0)
Hemoglobin: 11 g/dL — ABNORMAL LOW (ref 12.0–15.0)
Immature Granulocytes: 0 %
Lymphocytes Relative: 48 %
Lymphs Abs: 3.9 10*3/uL (ref 0.7–4.0)
MCH: 32.6 pg (ref 26.0–34.0)
MCHC: 33.6 g/dL (ref 30.0–36.0)
MCV: 97 fL (ref 80.0–100.0)
Monocytes Absolute: 0.8 10*3/uL (ref 0.1–1.0)
Monocytes Relative: 10 %
Neutro Abs: 3.1 10*3/uL (ref 1.7–7.7)
Neutrophils Relative %: 37 %
Platelet Count: 145 10*3/uL — ABNORMAL LOW (ref 150–400)
RBC: 3.37 MIL/uL — ABNORMAL LOW (ref 3.87–5.11)
RDW: 13.4 % (ref 11.5–15.5)
WBC Count: 8.2 10*3/uL (ref 4.0–10.5)
nRBC: 0 % (ref 0.0–0.2)

## 2020-07-25 LAB — CMP (CANCER CENTER ONLY)
ALT: 14 U/L (ref 0–44)
AST: 20 U/L (ref 15–41)
Albumin: 4.2 g/dL (ref 3.5–5.0)
Alkaline Phosphatase: 47 U/L (ref 38–126)
Anion gap: 5 (ref 5–15)
BUN: 22 mg/dL (ref 8–23)
CO2: 30 mmol/L (ref 22–32)
Calcium: 9.9 mg/dL (ref 8.9–10.3)
Chloride: 103 mmol/L (ref 98–111)
Creatinine: 1.01 mg/dL — ABNORMAL HIGH (ref 0.44–1.00)
GFR, Est AFR Am: >60 mL/min (ref >60)
GFR, Estimated: 53 mL/min — ABNORMAL LOW (ref >60)
Glucose, Bld: 88 mg/dL (ref 70–99)
Potassium: 4.9 mmol/L (ref 3.5–5.1)
Sodium: 138 mmol/L (ref 135–145)
Total Bilirubin: 0.6 mg/dL (ref 0.3–1.2)
Total Protein: 6.7 g/dL (ref 6.5–8.1)

## 2020-07-25 NOTE — Progress Notes (Signed)
Hematology and Oncology Follow Up Visit  Jodi Woods [id number] Sep 07, 1941 79 y.o. 07/25/2020   Principle Diagnosis:   CML - Chronic Phase  Current Therapy:    Sprycel 50 mg po q other day -- change on 05/01/2020     Interim History:  Jodi Woods is in for follow-up.  Overall, Jodi Woods is doing pretty well.  Jodi Woods really has had no specific complaints since we last saw her.  Jodi Woods is still dealing with her husband's death which was sudden in late May.  When we last saw her, her BCR/ABL ratio kept going down.  Was down to 0.187%.  Jodi Woods is doing well on the Sprycel.  Jodi Woods is on every other day dosing.  Jodi Woods has had no problems with diarrhea.  Jodi Woods has had no problems with nausea or vomiting.  There is been no rashes.  Jodi Woods has had no leg swelling.  Jodi Woods had no cough or shortness of breath.  Jodi Woods is having no problems with bleeding.  Is been no problems with headache.  Jodi Woods might be going down to [place] for a football game.  I told her that it would be okay from my point of view if Jodi Woods went down as long Jodi Woods wore a mask.    Overall, her performance status is ECOG 1.       Medications:  Current Outpatient Medications:  .  aspirin EC 81 MG tablet, Take 81 mg by mouth daily., Disp: , Rfl:  .  atorvastatin (LIPITOR) 10 MG tablet, Take 10 mg by mouth daily., Disp: , Rfl:  .  cholecalciferol (VITAMIN D3) 25 MCG (1000 UT) tablet, Take 1,000 Units by mouth daily., Disp: , Rfl:  .  dasatinib (SPRYCEL) 50 MG tablet, Take 1 tablet (50 mg total) by mouth daily., Disp: 30 tablet, Rfl: 8 .  levothyroxine (SYNTHROID) 100 MCG tablet, Take 100 mcg by mouth daily before breakfast., Disp: , Rfl:  .  lisinopril-hydrochlorothiazide (ZESTORETIC) 20-12.5 MG tablet, Take 1 tablet by mouth daily., Disp: , Rfl:   Allergies:  No Active Allergies  Past Medical History, Surgical history, Social history, and Family History were reviewed and updated.  Review of Systems: Review of Systems  Constitutional: Negative.     HENT:  Negative.   Eyes: Negative.   Respiratory: Negative.   Cardiovascular: Negative.   Gastrointestinal: Negative.   Endocrine: Negative.   Genitourinary: Negative.    Musculoskeletal: Negative.   Skin: Negative.   Neurological: Negative.   Hematological: Negative.   Psychiatric/Behavioral: Negative.     Physical Exam:  vitals were not taken for this visit.   Wt Readings from Last 3 Encounters:  05/01/20 156 lb (70.8 kg)  03/20/20 158 lb (71.7 kg)  02/07/20 162 lb (73.5 kg)    Physical Exam Vitals reviewed.  HENT:     Head: Normocephalic and atraumatic.  Eyes:     Pupils: Pupils are equal, round, and reactive to light.  Cardiovascular:     Rate and Rhythm: Normal rate and regular rhythm.     Heart sounds: Normal heart sounds.  Pulmonary:     Effort: Pulmonary effort is normal.     Breath sounds: Normal breath sounds.  Abdominal:     General: Bowel sounds are normal.     Palpations: Abdomen is soft.  Musculoskeletal:        General: No tenderness or deformity. Normal range of motion.     Cervical back: Normal range of motion.  Lymphadenopathy:     Cervical: No cervical  adenopathy.  Skin:    General: Skin is warm and dry.     Findings: No erythema or rash.  Neurological:     Mental Status: Jodi Woods is alert and oriented to person, place, and time.  Psychiatric:        Behavior: Behavior normal.        Thought Content: Thought content normal.        Judgment: Judgment normal.      Lab Results  Component Value Date   WBC 8.2 07/25/2020   HGB 11.0 (L) 07/25/2020   HCT 32.7 (L) 07/25/2020   MCV 97.0 07/25/2020   PLT 145 (L) 07/25/2020     Chemistry      Component Value Date/Time   NA 138 07/25/2020 1136   K 4.9 07/25/2020 1136   CL 103 07/25/2020 1136   CO2 30 07/25/2020 1136   BUN 22 07/25/2020 1136   CREATININE 1.01 (H) 07/25/2020 1136      Component Value Date/Time   CALCIUM 9.9 07/25/2020 1136   ALKPHOS 47 07/25/2020 1136   AST 20 07/25/2020  1136   ALT 14 07/25/2020 1136   BILITOT 0.6 07/25/2020 1136      Impression and Plan: Jodi Woods is a a very charming 79 year old white female.  Jodi Woods has chronic phase CML.  It is nice to see that Jodi Woods is responding.  We will have to see what the BCR/ABL ratio is today.  I looked at her blood smear under the microscope.  I do not see any immature myeloid cells.  I do not see any nucleated red blood cells.  Platelets were adequate in number and size.  Platelets were well granulated.  They were better then a couple weeks ago.  We will not make any changes with the Sprycel.  Hopefully, we will see that Jodi Woods is continue to respond.    Jodi Woods likes to every other day dosing.  We will not get her back between Thanksgiving and Christmas.  I think this would be very reasonable.      Volanda Napoleon, MD 8/31/202112:21 PM

## 2020-07-26 LAB — LACTATE DEHYDROGENASE: LDH: 190 U/L (ref 98–192)

## 2020-08-03 DIAGNOSIS — R490 Dysphonia: Secondary | ICD-10-CM | POA: Diagnosis not present

## 2020-08-03 LAB — BCR/ABL

## 2020-09-21 DIAGNOSIS — I10 Essential (primary) hypertension: Secondary | ICD-10-CM | POA: Diagnosis not present

## 2020-09-21 DIAGNOSIS — C9210 Chronic myeloid leukemia, BCR/ABL-positive, not having achieved remission: Secondary | ICD-10-CM | POA: Diagnosis not present

## 2020-09-21 DIAGNOSIS — N3281 Overactive bladder: Secondary | ICD-10-CM | POA: Diagnosis not present

## 2020-09-21 DIAGNOSIS — K219 Gastro-esophageal reflux disease without esophagitis: Secondary | ICD-10-CM | POA: Diagnosis not present

## 2020-09-26 DIAGNOSIS — M1711 Unilateral primary osteoarthritis, right knee: Secondary | ICD-10-CM | POA: Diagnosis not present

## 2020-10-24 ENCOUNTER — Inpatient Hospital Stay: Payer: Medicare PPO | Attending: Oncology | Admitting: Hematology & Oncology

## 2020-10-24 ENCOUNTER — Inpatient Hospital Stay: Payer: Medicare PPO

## 2020-10-24 ENCOUNTER — Telehealth: Payer: Self-pay

## 2020-10-24 ENCOUNTER — Encounter: Payer: Self-pay | Admitting: Hematology & Oncology

## 2020-10-24 ENCOUNTER — Other Ambulatory Visit: Payer: Self-pay

## 2020-10-24 VITALS — BP 161/62 | HR 64 | Temp 98.0°F | Resp 18 | Wt 162.8 lb

## 2020-10-24 DIAGNOSIS — Z79899 Other long term (current) drug therapy: Secondary | ICD-10-CM | POA: Diagnosis not present

## 2020-10-24 DIAGNOSIS — C9210 Chronic myeloid leukemia, BCR/ABL-positive, not having achieved remission: Secondary | ICD-10-CM

## 2020-10-24 LAB — CBC WITH DIFFERENTIAL (CANCER CENTER ONLY)
Abs Immature Granulocytes: 0.02 10*3/uL (ref 0.00–0.07)
Basophils Absolute: 0 10*3/uL (ref 0.0–0.1)
Basophils Relative: 0 %
Eosinophils Absolute: 0.2 10*3/uL (ref 0.0–0.5)
Eosinophils Relative: 4 %
HCT: 34 % — ABNORMAL LOW (ref 36.0–46.0)
Hemoglobin: 11 g/dL — ABNORMAL LOW (ref 12.0–15.0)
Immature Granulocytes: 0 %
Lymphocytes Relative: 30 %
Lymphs Abs: 1.4 10*3/uL (ref 0.7–4.0)
MCH: 32.3 pg (ref 26.0–34.0)
MCHC: 32.4 g/dL (ref 30.0–36.0)
MCV: 99.7 fL (ref 80.0–100.0)
Monocytes Absolute: 0.5 10*3/uL (ref 0.1–1.0)
Monocytes Relative: 10 %
Neutro Abs: 2.6 10*3/uL (ref 1.7–7.7)
Neutrophils Relative %: 56 %
Platelet Count: 177 10*3/uL (ref 150–400)
RBC: 3.41 MIL/uL — ABNORMAL LOW (ref 3.87–5.11)
RDW: 12.8 % (ref 11.5–15.5)
WBC Count: 4.8 10*3/uL (ref 4.0–10.5)
nRBC: 0 % (ref 0.0–0.2)

## 2020-10-24 LAB — CMP (CANCER CENTER ONLY)
ALT: 11 U/L (ref 0–44)
AST: 19 U/L (ref 15–41)
Albumin: 4.1 g/dL (ref 3.5–5.0)
Alkaline Phosphatase: 54 U/L (ref 38–126)
Anion gap: 5 (ref 5–15)
BUN: 23 mg/dL (ref 8–23)
CO2: 29 mmol/L (ref 22–32)
Calcium: 9.7 mg/dL (ref 8.9–10.3)
Chloride: 104 mmol/L (ref 98–111)
Creatinine: 1.06 mg/dL — ABNORMAL HIGH (ref 0.44–1.00)
GFR, Estimated: 53 mL/min — ABNORMAL LOW (ref >60)
Glucose, Bld: 94 mg/dL (ref 70–99)
Potassium: 4.3 mmol/L (ref 3.5–5.1)
Sodium: 138 mmol/L (ref 135–145)
Total Bilirubin: 0.6 mg/dL (ref 0.3–1.2)
Total Protein: 6.8 g/dL (ref 6.5–8.1)

## 2020-10-24 LAB — LACTATE DEHYDROGENASE: LDH: 178 U/L (ref 98–192)

## 2020-10-24 LAB — SAVE SMEAR(SSMR), FOR PROVIDER SLIDE REVIEW

## 2020-10-24 NOTE — Progress Notes (Signed)
Hematology and Oncology Follow Up Visit  Jodi Woods [id number] [birth year] 79 y.o. 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  She really is doing fantastic.  We last saw her back in August.  Unfortunately, in September she had a bad fall.  Thankfully, she did not break anything.  She showed me pictures of her poor face.  She will have bruising on the face.  She really has done nicely with the Sprycel.  She has had no problems with Sprycel with respect to swelling.  Her last BCR/ABL was not detected.  As such, she is a major molecular remission.  She has had no problems with diarrhea.  There is been no rashes.  She has had no leg swelling.  She has had no cough or shortness of breath.  She had a very nice Thanksgiving with her family.  Overall, her performance status is ECOG 0.     Medications:  Current Outpatient Medications:  .  aspirin EC 81 MG tablet, Take 81 mg by mouth daily., Disp: , Rfl:  .  atorvastatin (LIPITOR) 20 MG tablet, Take 20 mg by mouth daily., Disp: , Rfl:  .  cholecalciferol (VITAMIN D3) 25 MCG (1000 UT) tablet, Take 1,000 Units by mouth daily., Disp: , Rfl:  .  dasatinib (SPRYCEL) 50 MG tablet, Take 1 tablet (50 mg total) by mouth daily., Disp: 30 tablet, Rfl: 8 .  famotidine (PEPCID) 20 MG tablet, Take 20 mg by mouth 2 (two) times daily., Disp: , Rfl:  .  levothyroxine (SYNTHROID) 100 MCG tablet, Take 100 mcg by mouth daily before breakfast., Disp: , Rfl:  .  lisinopril-hydrochlorothiazide (ZESTORETIC) 20-12.5 MG tablet, Take 1 tablet by mouth daily., Disp: , Rfl:  .  solifenacin (VESICARE) 5 MG tablet, Take 5 mg by mouth daily., Disp: , Rfl:   Allergies:  No Known Allergies  Past Medical History, Surgical history, Social history, and Family History were reviewed and updated.  Review of  Systems: Review of Systems  Constitutional: Negative.   HENT:  Negative.   Eyes: Negative.   Respiratory: Negative.   Cardiovascular: Negative.   Gastrointestinal: Negative.   Endocrine: Negative.   Genitourinary: Negative.    Musculoskeletal: Negative.   Skin: Negative.   Neurological: Negative.   Hematological: Negative.   Psychiatric/Behavioral: Negative.     Physical Exam:  weight is 162 lb 12 oz (73.8 kg). Her oral temperature is 98 F (36.7 C). Her blood pressure is 161/62 (abnormal) and her pulse is 64. Her respiration is 18 and oxygen saturation is 100%.   Wt Readings from Last 3 Encounters:  10/24/20 162 lb 12 oz (73.8 kg)  07/25/20 160 lb (72.6 kg)  05/01/20 156 lb (70.8 kg)    Physical Exam Vitals reviewed.  HENT:     Head: Normocephalic and atraumatic.  Eyes:     Pupils: Pupils are equal, round, and reactive to light.  Cardiovascular:     Rate and Rhythm: Normal rate and regular rhythm.     Heart sounds: Normal heart sounds.  Pulmonary:     Effort: Pulmonary effort is normal.     Breath sounds: Normal breath sounds.  Abdominal:     General: Bowel sounds are normal.     Palpations: Abdomen is soft.  Musculoskeletal:  General: No tenderness or deformity. Normal range of motion.     Cervical back: Normal range of motion.  Lymphadenopathy:     Cervical: No cervical adenopathy.  Skin:    General: Skin is warm and dry.     Findings: No erythema or rash.  Neurological:     Mental Status: She is alert and oriented to person, place, and time.  Psychiatric:        Behavior: Behavior normal.        Thought Content: Thought content normal.        Judgment: Judgment normal.      Lab Results  Component Value Date   WBC 4.8 10/24/2020   HGB 11.0 (L) 10/24/2020   HCT 34.0 (L) 10/24/2020   MCV 99.7 10/24/2020   PLT 177 10/24/2020     Chemistry      Component Value Date/Time   NA 138 10/24/2020 1134   K 4.3 10/24/2020 1134   CL 104 10/24/2020  1134   CO2 29 10/24/2020 1134   BUN 23 10/24/2020 1134   CREATININE 1.06 (H) 10/24/2020 1134      Component Value Date/Time   CALCIUM 9.7 10/24/2020 1134   ALKPHOS 54 10/24/2020 1134   AST 19 10/24/2020 1134   ALT 11 10/24/2020 1134   BILITOT 0.6 10/24/2020 1134      Impression and Plan: Jodi Woods is a a very charming 79 year old white female.  She has chronic phase CML.  She is now in a major molecular remission.  I am so happy for her.   I looked at her blood smear under the microscope.  I do not see any immature myeloid cells.  I do not see any nucleated red blood cells.  Platelets were adequate in number and size.  Platelets were well granulated.   We will not make any changes with the Sprycel.  Hopefully, we will see that she is continue to respond.      We will now have her come back in 3 months.  We will get her through the holidays and winter.       Volanda Napoleon, MD 11/30/202112:55 PM

## 2020-10-24 NOTE — Telephone Encounter (Signed)
appts made and printed for pt per 10/24/20 LOS... AOM

## 2020-10-25 MED FILL — SPRYCEL 50 MG TABLET: 50 | 30 days supply | Qty: 30 | Fill #3

## 2020-10-31 ENCOUNTER — Encounter: Payer: Self-pay | Admitting: *Deleted

## 2020-10-31 LAB — BCR/ABL

## 2020-11-21 ENCOUNTER — Other Ambulatory Visit: Payer: Self-pay | Admitting: *Deleted

## 2020-11-21 DIAGNOSIS — C9210 Chronic myeloid leukemia, BCR/ABL-positive, not having achieved remission: Secondary | ICD-10-CM

## 2020-11-21 MED ORDER — DASATINIB 50 MG PO TABS: 50.0000 mg | ORAL_TABLET | Freq: Every day | ORAL | 8 refills | Status: DC

## 2020-12-26 ENCOUNTER — Telehealth: Payer: Self-pay | Admitting: Pharmacy Technician

## 2020-12-26 DIAGNOSIS — U071 COVID-19: Secondary | ICD-10-CM | POA: Diagnosis not present

## 2020-12-26 NOTE — Telephone Encounter (Signed)
Oral Oncology Patient Advocate Encounter  Received notification from [place] that the existing prior authorization for Sprycel is due for renewal.  Renewal PA submitted on CoverMyMeds Key [id number]  Status is pending  Oral Oncology Clinic will continue to follow.  [place] Patient [place] Phone [phone] Fax [phone] 12/26/2020 10:58 AM

## 2020-12-27 NOTE — Telephone Encounter (Signed)
Oral Oncology Patient Advocate Encounter  Prior Authorization for Sprycel has been approved.    PA# [id number] Effective dates: 12/26/20 through 12/26/21  Oral Oncology Clinic will continue to follow.   [place] Patient [place] Phone [phone] Fax [phone] 12/27/2020 8:58 AM

## 2021-01-04 ENCOUNTER — Other Ambulatory Visit: Payer: Self-pay | Admitting: *Deleted

## 2021-01-04 ENCOUNTER — Other Ambulatory Visit: Payer: Self-pay | Admitting: Hematology & Oncology

## 2021-01-04 DIAGNOSIS — C9210 Chronic myeloid leukemia, BCR/ABL-positive, not having achieved remission: Secondary | ICD-10-CM

## 2021-01-04 MED ORDER — DASATINIB 50 MG PO TABS: 50.0000 mg | ORAL_TABLET | ORAL | 8 refills | Status: DC

## 2021-01-11 DIAGNOSIS — H4720 Unspecified optic atrophy: Secondary | ICD-10-CM | POA: Diagnosis not present

## 2021-01-17 MED FILL — SPRYCEL 50 MG TABLET: 50 | 30 days supply | Qty: 15 | Fill #0

## 2021-01-18 DIAGNOSIS — H5340 Unspecified visual field defects: Secondary | ICD-10-CM | POA: Diagnosis not present

## 2021-01-18 DIAGNOSIS — H3500 Unspecified background retinopathy: Secondary | ICD-10-CM | POA: Diagnosis not present

## 2021-01-18 DIAGNOSIS — H52203 Unspecified astigmatism, bilateral: Secondary | ICD-10-CM | POA: Diagnosis not present

## 2021-01-18 DIAGNOSIS — H4720 Unspecified optic atrophy: Secondary | ICD-10-CM | POA: Diagnosis not present

## 2021-01-22 ENCOUNTER — Telehealth: Payer: Self-pay

## 2021-01-22 ENCOUNTER — Encounter: Payer: Self-pay | Admitting: Hematology & Oncology

## 2021-01-22 ENCOUNTER — Inpatient Hospital Stay (HOSPITAL_BASED_OUTPATIENT_CLINIC_OR_DEPARTMENT_OTHER): Payer: Medicare HMO | Admitting: Hematology & Oncology

## 2021-01-22 ENCOUNTER — Inpatient Hospital Stay: Payer: Medicare HMO | Attending: Hematology & Oncology

## 2021-01-22 ENCOUNTER — Other Ambulatory Visit: Payer: Self-pay

## 2021-01-22 VITALS — BP 167/50 | HR 55 | Temp 98.8°F | Resp 20 | Wt 164.4 lb

## 2021-01-22 DIAGNOSIS — C9210 Chronic myeloid leukemia, BCR/ABL-positive, not having achieved remission: Secondary | ICD-10-CM

## 2021-01-22 DIAGNOSIS — C9211 Chronic myeloid leukemia, BCR/ABL-positive, in remission: Secondary | ICD-10-CM | POA: Insufficient documentation

## 2021-01-22 DIAGNOSIS — Z79899 Other long term (current) drug therapy: Secondary | ICD-10-CM | POA: Insufficient documentation

## 2021-01-22 DIAGNOSIS — Z803 Family history of malignant neoplasm of breast: Secondary | ICD-10-CM | POA: Insufficient documentation

## 2021-01-22 DIAGNOSIS — J029 Acute pharyngitis, unspecified: Secondary | ICD-10-CM

## 2021-01-22 DIAGNOSIS — D500 Iron deficiency anemia secondary to blood loss (chronic): Secondary | ICD-10-CM | POA: Diagnosis not present

## 2021-01-22 LAB — CBC WITH DIFFERENTIAL (CANCER CENTER ONLY)
Abs Immature Granulocytes: 0.02 10*3/uL (ref 0.00–0.07)
Basophils Absolute: 0 10*3/uL (ref 0.0–0.1)
Basophils Relative: 0 %
Eosinophils Absolute: 0.2 10*3/uL (ref 0.0–0.5)
Eosinophils Relative: 4 %
HCT: 33.2 % — ABNORMAL LOW (ref 36.0–46.0)
Hemoglobin: 11 g/dL — ABNORMAL LOW (ref 12.0–15.0)
Immature Granulocytes: 0 %
Lymphocytes Relative: 31 %
Lymphs Abs: 1.6 10*3/uL (ref 0.7–4.0)
MCH: 32 pg (ref 26.0–34.0)
MCHC: 33.1 g/dL (ref 30.0–36.0)
MCV: 96.5 fL (ref 80.0–100.0)
Monocytes Absolute: 0.6 10*3/uL (ref 0.1–1.0)
Monocytes Relative: 11 %
Neutro Abs: 2.7 10*3/uL (ref 1.7–7.7)
Neutrophils Relative %: 54 %
Platelet Count: 175 10*3/uL (ref 150–400)
RBC: 3.44 MIL/uL — ABNORMAL LOW (ref 3.87–5.11)
RDW: 12.8 % (ref 11.5–15.5)
WBC Count: 5.1 10*3/uL (ref 4.0–10.5)
nRBC: 0 % (ref 0.0–0.2)

## 2021-01-22 LAB — CMP (CANCER CENTER ONLY)
ALT: 12 U/L (ref 0–44)
AST: 18 U/L (ref 15–41)
Albumin: 4.4 g/dL (ref 3.5–5.0)
Alkaline Phosphatase: 59 U/L (ref 38–126)
Anion gap: 6 (ref 5–15)
BUN: 24 mg/dL — ABNORMAL HIGH (ref 8–23)
CO2: 30 mmol/L (ref 22–32)
Calcium: 9.9 mg/dL (ref 8.9–10.3)
Chloride: 102 mmol/L (ref 98–111)
Creatinine: 1.13 mg/dL — ABNORMAL HIGH (ref 0.44–1.00)
GFR, Estimated: 49 mL/min — ABNORMAL LOW (ref >60)
Glucose, Bld: 96 mg/dL (ref 70–99)
Potassium: 4.4 mmol/L (ref 3.5–5.1)
Sodium: 138 mmol/L (ref 135–145)
Total Bilirubin: 0.6 mg/dL (ref 0.3–1.2)
Total Protein: 6.9 g/dL (ref 6.5–8.1)

## 2021-01-22 LAB — LACTATE DEHYDROGENASE: LDH: 169 U/L (ref 98–192)

## 2021-01-22 NOTE — Telephone Encounter (Signed)
Called pt and left a vm with appts per 01/22/21 los       [place]

## 2021-01-22 NOTE — Progress Notes (Signed)
Hematology and Oncology Follow Up Visit  Jodi Woods [id number] 12/05/1940 80 y.o. 01/22/2021   Principle Diagnosis:   CML - Chronic Phase -- MMR  Current Therapy:    Sprycel 50 mg po q other day -- change on 05/01/2020     Interim History:  Jodi Woods is in for follow-up.  She is under a lot of stress.  A grandson died in Nov 09, 2023 of a drug overdose.  Her daughter has breast cancer.  She is about to undergo chemotherapy.  A lot has been going on with her.  She is complaining of abdominal fullness.  She says her abdominal area is getting tight.  She is on Sprycel.  I will know she may have some ascites.  I think would be worthwhile checking a ultrasound of her abdomen.  Patient also complained of a sore throat.  She is worried about throat cancer.  I spoke she may have some exposures for throat cancer.  We will go ahead and get a CT scan of her neck.  Her CML is not a problem right now.  Her last BCR/ABL was not found.  So she is in a major molecular remission.  She does not take the Sprycel daily.  She says that she on occasion forgets.  She has had no problems with cough.  She has had no fever.  She has had no rashes.  There is been no leg swelling.  Overall, her performance status is ECOG 1.      Medications:  Current Outpatient Medications:  .  acetaminophen (TYLENOL) 650 MG CR tablet, 2 tab, Disp: , Rfl:  .  aspirin EC 81 MG tablet, Take 81 mg by mouth daily., Disp: , Rfl:  .  atorvastatin (LIPITOR) 20 MG tablet, Take 20 mg by mouth daily., Disp: , Rfl:  .  cholecalciferol (VITAMIN D3) 25 MCG (1000 UT) tablet, Take 1,000 Units by mouth daily., Disp: , Rfl:  .  dasatinib (SPRYCEL) 50 MG tablet, Take 1 tablet (50 mg total) by mouth every other day., Disp: 15 tablet, Rfl: 8 .  famotidine (PEPCID) 20 MG tablet, Take 20 mg by mouth 2 (two) times daily., Disp: , Rfl:  .  levothyroxine (SYNTHROID) 100 MCG tablet, Take 100 mcg by mouth daily before breakfast., Disp: , Rfl:  .   lisinopril-hydrochlorothiazide (ZESTORETIC) 20-12.5 MG tablet, Take 1 tablet by mouth daily., Disp: , Rfl:  .  naproxen sodium (ALEVE) 220 MG tablet, 2 tablets with food or milk as needed, Disp: , Rfl:  .  solifenacin (VESICARE) 5 MG tablet, Take 5 mg by mouth daily., Disp: , Rfl:   Allergies:  No Known Allergies  Past Medical History, Surgical history, Social history, and Family History were reviewed and updated.  Review of Systems: Review of Systems  Constitutional: Negative.   HENT:  Negative.   Eyes: Negative.   Respiratory: Negative.   Cardiovascular: Negative.   Gastrointestinal: Negative.   Endocrine: Negative.   Genitourinary: Negative.    Musculoskeletal: Negative.   Skin: Negative.   Neurological: Negative.   Hematological: Negative.   Psychiatric/Behavioral: Negative.     Physical Exam:  weight is 164 lb 6.4 oz (74.6 kg). Her oral temperature is 98.8 F (37.1 C). Her blood pressure is 167/50 (abnormal) and her pulse is 55 (abnormal). Her respiration is 20 and oxygen saturation is 100%.   Wt Readings from Last 3 Encounters:  01/22/21 164 lb 6.4 oz (74.6 kg)  10/24/20 162 lb 12 oz (73.8 kg)  07/25/20  160 lb (72.6 kg)    Physical Exam Vitals reviewed.  HENT:     Head: Normocephalic and atraumatic.  Eyes:     Pupils: Pupils are equal, round, and reactive to light.  Cardiovascular:     Rate and Rhythm: Normal rate and regular rhythm.     Heart sounds: Normal heart sounds.  Pulmonary:     Effort: Pulmonary effort is normal.     Breath sounds: Normal breath sounds.  Abdominal:     General: Bowel sounds are normal.     Palpations: Abdomen is soft.  Musculoskeletal:        General: No tenderness or deformity. Normal range of motion.     Cervical back: Normal range of motion.  Lymphadenopathy:     Cervical: No cervical adenopathy.  Skin:    General: Skin is warm and dry.     Findings: No erythema or rash.  Neurological:     Mental Status: She is alert and  oriented to person, place, and time.  Psychiatric:        Behavior: Behavior normal.        Thought Content: Thought content normal.        Judgment: Judgment normal.      Lab Results  Component Value Date   WBC 5.1 01/22/2021   HGB 11.0 (L) 01/22/2021   HCT 33.2 (L) 01/22/2021   MCV 96.5 01/22/2021   PLT 175 01/22/2021     Chemistry      Component Value Date/Time   NA 138 01/22/2021 1133   K 4.4 01/22/2021 1133   CL 102 01/22/2021 1133   CO2 30 01/22/2021 1133   BUN 24 (H) 01/22/2021 1133   CREATININE 1.13 (H) 01/22/2021 1133      Component Value Date/Time   CALCIUM 9.9 01/22/2021 1133   ALKPHOS 59 01/22/2021 1133   AST 18 01/22/2021 1133   ALT 12 01/22/2021 1133   BILITOT 0.6 01/22/2021 1133      Impression and Plan: Jodi Woods is a a very charming 80 year old white female.  She has chronic phase CML.  She is now in a major molecular remission.  I am so happy for her.   I looked at her blood smear under the microscope.  I do not see any immature myeloid cells.  I do not see any nucleated red blood cells.  Platelets were adequate in number and size.  Platelets were well granulated.   We will go ahead with our scans in the ultrasound.  I just want her to have peace of mind.  I wanted to know that everything is going to be okay.  I know that she is going through a lot right now.  She has been through a lot.  She is worried about her daughter.  I gave her a prayer blanket to give to her daughter.  I would like to get her back in 3 more months.  I want to make sure that we follow-up with everything.     Volanda Napoleon, MD 2/28/202212:45 PM

## 2021-01-25 ENCOUNTER — Ambulatory Visit (HOSPITAL_BASED_OUTPATIENT_CLINIC_OR_DEPARTMENT_OTHER): Payer: BC Managed Care – PPO

## 2021-01-26 ENCOUNTER — Ambulatory Visit (HOSPITAL_BASED_OUTPATIENT_CLINIC_OR_DEPARTMENT_OTHER)
Admission: RE | Admit: 2021-01-26 | Discharge: 2021-01-26 | Disposition: A | Discharge status: Home / Self Care | Payer: Medicare HMO | Source: Ambulatory Visit | Attending: Hematology & Oncology | Admitting: Hematology & Oncology

## 2021-01-26 ENCOUNTER — Other Ambulatory Visit: Payer: Self-pay

## 2021-01-26 ENCOUNTER — Telehealth: Payer: Self-pay | Admitting: *Deleted

## 2021-01-26 DIAGNOSIS — C9210 Chronic myeloid leukemia, BCR/ABL-positive, not having achieved remission: Secondary | ICD-10-CM

## 2021-01-26 DIAGNOSIS — E041 Nontoxic single thyroid nodule: Secondary | ICD-10-CM | POA: Diagnosis not present

## 2021-01-26 DIAGNOSIS — J029 Acute pharyngitis, unspecified: Secondary | ICD-10-CM

## 2021-01-26 DIAGNOSIS — R140 Abdominal distension (gaseous): Secondary | ICD-10-CM | POA: Diagnosis not present

## 2021-01-26 DIAGNOSIS — I700 Atherosclerosis of aorta: Secondary | ICD-10-CM | POA: Diagnosis not present

## 2021-01-26 DIAGNOSIS — K7689 Other specified diseases of liver: Secondary | ICD-10-CM | POA: Diagnosis not present

## 2021-01-26 DIAGNOSIS — R490 Dysphonia: Secondary | ICD-10-CM | POA: Diagnosis not present

## 2021-01-26 MED ORDER — IOHEXOL 300 MG/ML  SOLN
100.0000 mL | Freq: Once | INTRAMUSCULAR | Status: AC | PRN
  Administered 2021-01-26: 80 mL via INTRAVENOUS

## 2021-01-26 NOTE — Telephone Encounter (Signed)
-----   Message from Volanda Napoleon, MD sent at 01/26/2021  4:38 PM EST ----- Call - the CT scan is totally normal!!  No cancer or infection!!  Laurey Arrow

## 2021-01-26 NOTE — Telephone Encounter (Signed)
Notified pt of results. No concerns at this time. 

## 2021-01-29 ENCOUNTER — Encounter: Payer: Self-pay | Admitting: *Deleted

## 2021-01-30 LAB — BCR/ABL

## 2021-02-20 ENCOUNTER — Other Ambulatory Visit (HOSPITAL_COMMUNITY): Payer: Self-pay

## 2021-02-20 MED FILL — SPRYCEL 50 MG TABLET: 50 | 30 days supply | Qty: 15 | Fill #1

## 2021-03-15 ENCOUNTER — Other Ambulatory Visit (HOSPITAL_COMMUNITY): Payer: Self-pay

## 2021-03-15 MED FILL — Dasatinib Tab 50 MG: ORAL | 30 days supply | Qty: 15 | Fill #0 | Status: AC

## 2021-03-19 ENCOUNTER — Other Ambulatory Visit (HOSPITAL_COMMUNITY): Payer: Self-pay

## 2021-03-29 ENCOUNTER — Telehealth: Payer: Self-pay

## 2021-03-29 DIAGNOSIS — E7800 Pure hypercholesterolemia, unspecified: Secondary | ICD-10-CM | POA: Diagnosis not present

## 2021-03-29 DIAGNOSIS — N3281 Overactive bladder: Secondary | ICD-10-CM | POA: Diagnosis not present

## 2021-03-29 DIAGNOSIS — N179 Acute kidney failure, unspecified: Secondary | ICD-10-CM | POA: Diagnosis not present

## 2021-03-29 DIAGNOSIS — J449 Chronic obstructive pulmonary disease, unspecified: Secondary | ICD-10-CM | POA: Diagnosis not present

## 2021-03-29 DIAGNOSIS — C9210 Chronic myeloid leukemia, BCR/ABL-positive, not having achieved remission: Secondary | ICD-10-CM | POA: Diagnosis not present

## 2021-03-29 DIAGNOSIS — Z1389 Encounter for screening for other disorder: Secondary | ICD-10-CM | POA: Diagnosis not present

## 2021-03-29 DIAGNOSIS — E039 Hypothyroidism, unspecified: Secondary | ICD-10-CM | POA: Diagnosis not present

## 2021-03-29 DIAGNOSIS — I10 Essential (primary) hypertension: Secondary | ICD-10-CM | POA: Diagnosis not present

## 2021-03-29 DIAGNOSIS — K219 Gastro-esophageal reflux disease without esophagitis: Secondary | ICD-10-CM | POA: Diagnosis not present

## 2021-03-29 DIAGNOSIS — Z0000 Encounter for general adult medical examination without abnormal findings: Secondary | ICD-10-CM | POA: Diagnosis not present

## 2021-03-29 NOTE — Telephone Encounter (Signed)
Called pt and r/s her appt from 5/27 as this is a call day for dr e and he was overbooked   [employer]

## 2021-04-06 DIAGNOSIS — I10 Essential (primary) hypertension: Secondary | ICD-10-CM | POA: Diagnosis not present

## 2021-04-06 DIAGNOSIS — R413 Other amnesia: Secondary | ICD-10-CM | POA: Diagnosis not present

## 2021-04-06 DIAGNOSIS — N179 Acute kidney failure, unspecified: Secondary | ICD-10-CM | POA: Diagnosis not present

## 2021-04-12 ENCOUNTER — Other Ambulatory Visit (HOSPITAL_COMMUNITY): Payer: Self-pay

## 2021-04-16 ENCOUNTER — Other Ambulatory Visit (HOSPITAL_COMMUNITY): Payer: Self-pay

## 2021-04-16 MED FILL — Dasatinib Tab 50 MG: ORAL | 30 days supply | Qty: 15 | Fill #1 | Status: AC

## 2021-04-20 ENCOUNTER — Ambulatory Visit: Payer: BC Managed Care – PPO | Admitting: Hematology & Oncology

## 2021-04-20 ENCOUNTER — Other Ambulatory Visit: Payer: BC Managed Care – PPO

## 2021-04-24 ENCOUNTER — Other Ambulatory Visit (HOSPITAL_COMMUNITY): Payer: Self-pay

## 2021-05-08 ENCOUNTER — Telehealth: Payer: Self-pay

## 2021-05-08 ENCOUNTER — Inpatient Hospital Stay: Payer: Medicare HMO | Attending: Hematology & Oncology

## 2021-05-08 ENCOUNTER — Inpatient Hospital Stay (HOSPITAL_BASED_OUTPATIENT_CLINIC_OR_DEPARTMENT_OTHER): Payer: Medicare HMO | Admitting: Hematology & Oncology

## 2021-05-08 ENCOUNTER — Encounter: Payer: Self-pay | Admitting: Hematology & Oncology

## 2021-05-08 ENCOUNTER — Other Ambulatory Visit: Payer: Self-pay

## 2021-05-08 VITALS — BP 154/47 | HR 65 | Temp 97.7°F | Resp 18 | Wt 162.0 lb

## 2021-05-08 DIAGNOSIS — C9211 Chronic myeloid leukemia, BCR/ABL-positive, in remission: Secondary | ICD-10-CM | POA: Diagnosis not present

## 2021-05-08 DIAGNOSIS — C9210 Chronic myeloid leukemia, BCR/ABL-positive, not having achieved remission: Secondary | ICD-10-CM

## 2021-05-08 DIAGNOSIS — J029 Acute pharyngitis, unspecified: Secondary | ICD-10-CM

## 2021-05-08 LAB — CMP (CANCER CENTER ONLY)
ALT: 14 U/L (ref 0–44)
AST: 21 U/L (ref 15–41)
Albumin: 4.4 g/dL (ref 3.5–5.0)
Alkaline Phosphatase: 55 U/L (ref 38–126)
Anion gap: 6 (ref 5–15)
BUN: 29 mg/dL — ABNORMAL HIGH (ref 8–23)
CO2: 28 mmol/L (ref 22–32)
Calcium: 10 mg/dL (ref 8.9–10.3)
Chloride: 98 mmol/L (ref 98–111)
Creatinine: 1.27 mg/dL — ABNORMAL HIGH (ref 0.44–1.00)
GFR, Estimated: 43 mL/min — ABNORMAL LOW (ref >60)
Glucose, Bld: 95 mg/dL (ref 70–99)
Potassium: 4.7 mmol/L (ref 3.5–5.1)
Sodium: 132 mmol/L — ABNORMAL LOW (ref 135–145)
Total Bilirubin: 0.7 mg/dL (ref 0.3–1.2)
Total Protein: 6.7 g/dL (ref 6.5–8.1)

## 2021-05-08 LAB — CBC WITH DIFFERENTIAL (CANCER CENTER ONLY)
Abs Immature Granulocytes: 0.01 10*3/uL (ref 0.00–0.07)
Basophils Absolute: 0 10*3/uL (ref 0.0–0.1)
Basophils Relative: 0 %
Eosinophils Absolute: 0.2 10*3/uL (ref 0.0–0.5)
Eosinophils Relative: 3 %
HCT: 33.7 % — ABNORMAL LOW (ref 36.0–46.0)
Hemoglobin: 11.1 g/dL — ABNORMAL LOW (ref 12.0–15.0)
Immature Granulocytes: 0 %
Lymphocytes Relative: 42 %
Lymphs Abs: 2.1 10*3/uL (ref 0.7–4.0)
MCH: 31.8 pg (ref 26.0–34.0)
MCHC: 32.9 g/dL (ref 30.0–36.0)
MCV: 96.6 fL (ref 80.0–100.0)
Monocytes Absolute: 0.5 10*3/uL (ref 0.1–1.0)
Monocytes Relative: 9 %
Neutro Abs: 2.3 10*3/uL (ref 1.7–7.7)
Neutrophils Relative %: 46 %
Platelet Count: 206 10*3/uL (ref 150–400)
RBC: 3.49 MIL/uL — ABNORMAL LOW (ref 3.87–5.11)
RDW: 13.1 % (ref 11.5–15.5)
WBC Count: 5.1 10*3/uL (ref 4.0–10.5)
nRBC: 0 % (ref 0.0–0.2)

## 2021-05-08 LAB — LACTATE DEHYDROGENASE: LDH: 194 U/L — ABNORMAL HIGH (ref 98–192)

## 2021-05-08 NOTE — Telephone Encounter (Signed)
Appts made and priinted for pt per 05/08/21 los  [employer]

## 2021-05-08 NOTE — Progress Notes (Signed)
Hematology and Oncology Follow Up Visit  Jodi Woods [id number] 08-03-1941 80 y.o. 05/08/2021   Principle Diagnosis:  CML - Chronic Phase -- MMR  Current Therapy:   Sprycel 50 mg po q other day -- change on 05/01/2020     Interim History:  Jodi Woods is in for follow-up.  She is doing pretty well.  She is about to go to [place] for 10 days.  I am sure she will have a wonderful time in [place].  She is doing well on the Sprycel.  Her last BCR/ABL did not show any molecular changes.  She is still in a major molecular remission.  She has had no problems with cough or shortness of breath.  She has had no change in bowel or bladder habits.  She feels as if she is retaining some fluid.  She feels she is retaining some fluid around the abdominal area.  She has had no problems with bleeding.  She has had no rashes.  Overall, her performance status is ECOG 1.    Medications:  Current Outpatient Medications:    acetaminophen (TYLENOL) 650 MG CR tablet, 2 tab, Disp: , Rfl:    aspirin EC 81 MG tablet, Take 81 mg by mouth daily., Disp: , Rfl:    atorvastatin (LIPITOR) 20 MG tablet, Take 20 mg by mouth daily., Disp: , Rfl:    cholecalciferol (VITAMIN D3) 25 MCG (1000 UT) tablet, Take 1,000 Units by mouth daily., Disp: , Rfl:    dasatinib (SPRYCEL) 50 MG tablet, TAKE 1 TABLET (50 MG TOTAL) BY MOUTH EVERY OTHER DAY., Disp: 15 tablet, Rfl: 8   famotidine (PEPCID) 20 MG tablet, Take 20 mg by mouth 2 (two) times daily., Disp: , Rfl:    levothyroxine (SYNTHROID) 100 MCG tablet, Take 100 mcg by mouth daily before breakfast., Disp: , Rfl:    lisinopril-hydrochlorothiazide (ZESTORETIC) 20-12.5 MG tablet, Take 1 tablet by mouth daily., Disp: , Rfl:    naproxen sodium (ALEVE) 220 MG tablet, 2 tablets with food or milk as needed, Disp: , Rfl:    solifenacin (VESICARE) 5 MG tablet, Take 5 mg by mouth daily., Disp: , Rfl:   Allergies:  Allergies  Allergen Reactions   Amlodipine Besylate Other (See  Comments)   Amoxicillin-Pot Clavulanate Other (See Comments)    Past Medical History, Surgical history, Social history, and Family History were reviewed and updated.  Review of Systems: Review of Systems  Constitutional: Negative.   HENT:  Negative.    Eyes: Negative.   Respiratory: Negative.    Cardiovascular: Negative.   Gastrointestinal: Negative.   Endocrine: Negative.   Genitourinary: Negative.    Musculoskeletal: Negative.   Skin: Negative.   Neurological: Negative.   Hematological: Negative.   Psychiatric/Behavioral: Negative.     Physical Exam:  weight is 162 lb (73.5 kg). Her oral temperature is 97.7 F (36.5 C). Her blood pressure is 154/47 (abnormal) and her pulse is 65. Her respiration is 18 and oxygen saturation is 99%.   Wt Readings from Last 3 Encounters:  05/08/21 162 lb (73.5 kg)  01/22/21 164 lb 6.4 oz (74.6 kg)  10/24/20 162 lb 12 oz (73.8 kg)    Physical Exam Vitals reviewed.  HENT:     Head: Normocephalic and atraumatic.  Eyes:     Pupils: Pupils are equal, round, and reactive to light.  Cardiovascular:     Rate and Rhythm: Normal rate and regular rhythm.     Heart sounds: Normal heart sounds.  Pulmonary:  Effort: Pulmonary effort is normal.     Breath sounds: Normal breath sounds.  Abdominal:     General: Bowel sounds are normal.     Palpations: Abdomen is soft.  Musculoskeletal:        General: No tenderness or deformity. Normal range of motion.     Cervical back: Normal range of motion.  Lymphadenopathy:     Cervical: No cervical adenopathy.  Skin:    General: Skin is warm and dry.     Findings: No erythema or rash.  Neurological:     Mental Status: She is alert and oriented to person, place, and time.  Psychiatric:        Behavior: Behavior normal.        Thought Content: Thought content normal.        Judgment: Judgment normal.     Lab Results  Component Value Date   WBC 5.1 05/08/2021   HGB 11.1 (L) 05/08/2021   HCT  33.7 (L) 05/08/2021   MCV 96.6 05/08/2021   PLT 206 05/08/2021     Chemistry      Component Value Date/Time   NA 132 (L) 05/08/2021 1024   K 4.7 05/08/2021 1024   CL 98 05/08/2021 1024   CO2 28 05/08/2021 1024   BUN 29 (H) 05/08/2021 1024   CREATININE 1.27 (H) 05/08/2021 1024      Component Value Date/Time   CALCIUM 10.0 05/08/2021 1024   ALKPHOS 55 05/08/2021 1024   AST 21 05/08/2021 1024   ALT 14 05/08/2021 1024   BILITOT 0.7 05/08/2021 1024      Impression and Plan: Jodi Woods is a a very charming 80 year old white female.  She has chronic phase CML.  She is now in a major molecular remission.  I am so happy for her.   I looked at her blood smear under the microscope.  I do not see any immature myeloid cells.  I do not see any nucleated red blood cells.  Platelets were adequate in number and size.  Platelets were well granulated.   We will plan to see her back in 4 months.  I think this would be reasonable.  We can definitely get her through the summertime.       Volanda Napoleon, MD 6/14/202211:20 AM

## 2021-05-09 DIAGNOSIS — M1711 Unilateral primary osteoarthritis, right knee: Secondary | ICD-10-CM | POA: Diagnosis not present

## 2021-05-15 LAB — BCR/ABL

## 2021-05-16 ENCOUNTER — Encounter: Payer: Self-pay | Admitting: *Deleted

## 2021-05-17 ENCOUNTER — Other Ambulatory Visit (HOSPITAL_COMMUNITY): Payer: Self-pay

## 2021-05-17 MED FILL — Dasatinib Tab 50 MG: ORAL | 30 days supply | Qty: 15 | Fill #2 | Status: AC

## 2021-06-04 ENCOUNTER — Other Ambulatory Visit (HOSPITAL_COMMUNITY): Payer: Self-pay

## 2021-06-06 ENCOUNTER — Other Ambulatory Visit (HOSPITAL_COMMUNITY): Payer: Self-pay

## 2021-06-21 ENCOUNTER — Other Ambulatory Visit (HOSPITAL_COMMUNITY): Payer: Self-pay

## 2021-06-26 ENCOUNTER — Other Ambulatory Visit (HOSPITAL_COMMUNITY): Payer: Self-pay

## 2021-06-26 DIAGNOSIS — Z1231 Encounter for screening mammogram for malignant neoplasm of breast: Secondary | ICD-10-CM | POA: Diagnosis not present

## 2021-06-28 ENCOUNTER — Other Ambulatory Visit (HOSPITAL_COMMUNITY): Payer: Self-pay

## 2021-06-28 MED FILL — Dasatinib Tab 50 MG: ORAL | 30 days supply | Qty: 15 | Fill #3 | Status: AC

## 2021-07-06 DIAGNOSIS — R921 Mammographic calcification found on diagnostic imaging of breast: Secondary | ICD-10-CM | POA: Diagnosis not present

## 2021-07-06 DIAGNOSIS — R922 Inconclusive mammogram: Secondary | ICD-10-CM | POA: Diagnosis not present

## 2021-07-06 DIAGNOSIS — N6489 Other specified disorders of breast: Secondary | ICD-10-CM | POA: Diagnosis not present

## 2021-07-06 DIAGNOSIS — R928 Other abnormal and inconclusive findings on diagnostic imaging of breast: Secondary | ICD-10-CM | POA: Diagnosis not present

## 2021-07-11 ENCOUNTER — Other Ambulatory Visit (HOSPITAL_COMMUNITY): Payer: Self-pay

## 2021-07-25 ENCOUNTER — Other Ambulatory Visit (HOSPITAL_COMMUNITY): Payer: Self-pay

## 2021-07-27 ENCOUNTER — Other Ambulatory Visit (HOSPITAL_COMMUNITY): Payer: Self-pay

## 2021-08-08 ENCOUNTER — Other Ambulatory Visit (HOSPITAL_COMMUNITY): Payer: Self-pay

## 2021-08-08 MED FILL — Dasatinib Tab 50 MG: ORAL | 30 days supply | Qty: 15 | Fill #4 | Status: AC

## 2021-08-09 ENCOUNTER — Other Ambulatory Visit (HOSPITAL_COMMUNITY): Payer: Self-pay

## 2021-08-10 DIAGNOSIS — M19042 Primary osteoarthritis, left hand: Secondary | ICD-10-CM | POA: Diagnosis not present

## 2021-08-10 DIAGNOSIS — E039 Hypothyroidism, unspecified: Secondary | ICD-10-CM | POA: Diagnosis not present

## 2021-08-10 DIAGNOSIS — G8929 Other chronic pain: Secondary | ICD-10-CM | POA: Diagnosis not present

## 2021-08-10 DIAGNOSIS — I10 Essential (primary) hypertension: Secondary | ICD-10-CM | POA: Diagnosis not present

## 2021-08-10 DIAGNOSIS — M19041 Primary osteoarthritis, right hand: Secondary | ICD-10-CM | POA: Diagnosis not present

## 2021-08-10 DIAGNOSIS — J449 Chronic obstructive pulmonary disease, unspecified: Secondary | ICD-10-CM | POA: Diagnosis not present

## 2021-08-10 DIAGNOSIS — E7800 Pure hypercholesterolemia, unspecified: Secondary | ICD-10-CM | POA: Diagnosis not present

## 2021-08-10 DIAGNOSIS — K219 Gastro-esophageal reflux disease without esophagitis: Secondary | ICD-10-CM | POA: Diagnosis not present

## 2021-08-20 DIAGNOSIS — R21 Rash and other nonspecific skin eruption: Secondary | ICD-10-CM | POA: Diagnosis not present

## 2021-08-23 DIAGNOSIS — M1711 Unilateral primary osteoarthritis, right knee: Secondary | ICD-10-CM | POA: Diagnosis not present

## 2021-09-03 ENCOUNTER — Other Ambulatory Visit (HOSPITAL_COMMUNITY): Payer: Self-pay

## 2021-09-05 DIAGNOSIS — H5340 Unspecified visual field defects: Secondary | ICD-10-CM | POA: Diagnosis not present

## 2021-09-05 DIAGNOSIS — H3500 Unspecified background retinopathy: Secondary | ICD-10-CM | POA: Diagnosis not present

## 2021-09-05 DIAGNOSIS — H40013 Open angle with borderline findings, low risk, bilateral: Secondary | ICD-10-CM | POA: Diagnosis not present

## 2021-09-05 DIAGNOSIS — H349 Unspecified retinal vascular occlusion: Secondary | ICD-10-CM | POA: Diagnosis not present

## 2021-09-07 ENCOUNTER — Other Ambulatory Visit: Payer: Self-pay

## 2021-09-07 ENCOUNTER — Encounter: Payer: Self-pay | Admitting: Hematology & Oncology

## 2021-09-07 ENCOUNTER — Inpatient Hospital Stay (HOSPITAL_BASED_OUTPATIENT_CLINIC_OR_DEPARTMENT_OTHER): Payer: Medicare HMO | Admitting: Hematology & Oncology

## 2021-09-07 ENCOUNTER — Inpatient Hospital Stay: Payer: Medicare HMO | Attending: Hematology & Oncology

## 2021-09-07 VITALS — BP 164/53 | HR 67 | Temp 98.2°F | Resp 18 | Wt 160.0 lb

## 2021-09-07 DIAGNOSIS — Z880 Allergy status to penicillin: Secondary | ICD-10-CM | POA: Insufficient documentation

## 2021-09-07 DIAGNOSIS — C9210 Chronic myeloid leukemia, BCR/ABL-positive, not having achieved remission: Secondary | ICD-10-CM | POA: Diagnosis not present

## 2021-09-07 DIAGNOSIS — C9211 Chronic myeloid leukemia, BCR/ABL-positive, in remission: Secondary | ICD-10-CM | POA: Insufficient documentation

## 2021-09-07 LAB — CBC WITH DIFFERENTIAL (CANCER CENTER ONLY)
Abs Immature Granulocytes: 0.02 10*3/uL (ref 0.00–0.07)
Basophils Absolute: 0 10*3/uL (ref 0.0–0.1)
Basophils Relative: 0 %
Eosinophils Absolute: 0.1 10*3/uL (ref 0.0–0.5)
Eosinophils Relative: 2 %
HCT: 32.9 % — ABNORMAL LOW (ref 36.0–46.0)
Hemoglobin: 10.9 g/dL — ABNORMAL LOW (ref 12.0–15.0)
Immature Granulocytes: 0 %
Lymphocytes Relative: 23 %
Lymphs Abs: 1.6 10*3/uL (ref 0.7–4.0)
MCH: 32.5 pg (ref 26.0–34.0)
MCHC: 33.1 g/dL (ref 30.0–36.0)
MCV: 98.2 fL (ref 80.0–100.0)
Monocytes Absolute: 0.8 10*3/uL (ref 0.1–1.0)
Monocytes Relative: 11 %
Neutro Abs: 4.5 10*3/uL (ref 1.7–7.7)
Neutrophils Relative %: 64 %
Platelet Count: 165 10*3/uL (ref 150–400)
RBC: 3.35 MIL/uL — ABNORMAL LOW (ref 3.87–5.11)
RDW: 13 % (ref 11.5–15.5)
WBC Count: 7.1 10*3/uL (ref 4.0–10.5)
nRBC: 0 % (ref 0.0–0.2)

## 2021-09-07 LAB — CMP (CANCER CENTER ONLY)
ALT: 14 U/L (ref 0–44)
AST: 16 U/L (ref 15–41)
Albumin: 4.2 g/dL (ref 3.5–5.0)
Alkaline Phosphatase: 72 U/L (ref 38–126)
Anion gap: 6 (ref 5–15)
BUN: 29 mg/dL — ABNORMAL HIGH (ref 8–23)
CO2: 27 mmol/L (ref 22–32)
Calcium: 10 mg/dL (ref 8.9–10.3)
Chloride: 106 mmol/L (ref 98–111)
Creatinine: 1.11 mg/dL — ABNORMAL HIGH (ref 0.44–1.00)
GFR, Estimated: 50 mL/min — ABNORMAL LOW (ref >60)
Glucose, Bld: 91 mg/dL (ref 70–99)
Potassium: 4.5 mmol/L (ref 3.5–5.1)
Sodium: 139 mmol/L (ref 135–145)
Total Bilirubin: 0.6 mg/dL (ref 0.3–1.2)
Total Protein: 6.3 g/dL — ABNORMAL LOW (ref 6.5–8.1)

## 2021-09-07 LAB — LACTATE DEHYDROGENASE: LDH: 175 U/L (ref 98–192)

## 2021-09-07 LAB — SAVE SMEAR(SSMR), FOR PROVIDER SLIDE REVIEW

## 2021-09-07 NOTE — Progress Notes (Signed)
Hematology and Oncology Follow Up Visit  Jodi Woods [id number] Mar 10, 1941 80 y.o. 09/07/2021   Principle Diagnosis:  CML - Chronic Phase -- MMR  Current Therapy:   Sprycel 50 mg po q other day -- change on 05/01/2020     Interim History:  Jodi Woods is in for follow-up.  We last saw her back in June.  Since then, she had been quite busy.  She went out to [place].  She had a wonderful time in [place].  She also went to the beach..  She is doing well on the Sprycel.  She does not take it all the time.  We have her taking it every other day but even that she does not do.  Her last BCR/ABL level was not detectable.  She has had no diarrhea.  There has been no cough.  She has had no nausea or vomiting.  She has had no rashes.  There is been no leg swelling.  Thankfully, she has had no problems with COVID.  Overall, her performance status is ECOG 1.    Medications:  Current Outpatient Medications:    acetaminophen (TYLENOL) 650 MG CR tablet, 2 tab, Disp: , Rfl:    aspirin EC 81 MG tablet, Take 81 mg by mouth daily., Disp: , Rfl:    atorvastatin (LIPITOR) 20 MG tablet, Take 20 mg by mouth daily., Disp: , Rfl:    cholecalciferol (VITAMIN D3) 25 MCG (1000 UT) tablet, Take 1,000 Units by mouth daily., Disp: , Rfl:    dasatinib (SPRYCEL) 50 MG tablet, TAKE 1 TABLET (50 MG TOTAL) BY MOUTH EVERY OTHER DAY., Disp: 15 tablet, Rfl: 8   famotidine (PEPCID) 20 MG tablet, Take 20 mg by mouth 2 (two) times daily., Disp: , Rfl:    levothyroxine (SYNTHROID) 100 MCG tablet, Take 100 mcg by mouth daily before breakfast., Disp: , Rfl:    lisinopril-hydrochlorothiazide (ZESTORETIC) 20-12.5 MG tablet, Take 1 tablet by mouth daily., Disp: , Rfl:    naproxen sodium (ALEVE) 220 MG tablet, 2 tablets with food or milk as needed, Disp: , Rfl:    solifenacin (VESICARE) 5 MG tablet, Take 5 mg by mouth daily., Disp: , Rfl:   Allergies:  Allergies  Allergen Reactions   Amlodipine Besylate Other (See  Comments)   Amoxicillin-Pot Clavulanate Other (See Comments)    Past Medical History, Surgical history, Social history, and Family History were reviewed and updated.  Review of Systems: Review of Systems  Constitutional: Negative.   HENT:  Negative.    Eyes: Negative.   Respiratory: Negative.    Cardiovascular: Negative.   Gastrointestinal: Negative.   Endocrine: Negative.   Genitourinary: Negative.    Musculoskeletal: Negative.   Skin: Negative.   Neurological: Negative.   Hematological: Negative.   Psychiatric/Behavioral: Negative.     Physical Exam:  weight is 160 lb (72.6 kg). Her oral temperature is 98.2 F (36.8 C). Her blood pressure is 164/53 (abnormal) and her pulse is 67. Her respiration is 18 and oxygen saturation is 100%.   Wt Readings from Last 3 Encounters:  09/07/21 160 lb (72.6 kg)  05/08/21 162 lb (73.5 kg)  01/22/21 164 lb 6.4 oz (74.6 kg)    Physical Exam Vitals reviewed.  HENT:     Head: Normocephalic and atraumatic.  Eyes:     Pupils: Pupils are equal, round, and reactive to light.  Cardiovascular:     Rate and Rhythm: Normal rate and regular rhythm.     Heart sounds: Normal heart sounds.  Pulmonary:     Effort: Pulmonary effort is normal.     Breath sounds: Normal breath sounds.  Abdominal:     General: Bowel sounds are normal.     Palpations: Abdomen is soft.  Musculoskeletal:        General: No tenderness or deformity. Normal range of motion.     Cervical back: Normal range of motion.  Lymphadenopathy:     Cervical: No cervical adenopathy.  Skin:    General: Skin is warm and dry.     Findings: No erythema or rash.  Neurological:     Mental Status: She is alert and oriented to person, place, and time.  Psychiatric:        Behavior: Behavior normal.        Thought Content: Thought content normal.        Judgment: Judgment normal.     Lab Results  Component Value Date   WBC 7.1 09/07/2021   HGB 10.9 (L) 09/07/2021   HCT 32.9 (L)  09/07/2021   MCV 98.2 09/07/2021   PLT 165 09/07/2021     Chemistry      Component Value Date/Time   NA 132 (L) 05/08/2021 1024   K 4.7 05/08/2021 1024   CL 98 05/08/2021 1024   CO2 28 05/08/2021 1024   BUN 29 (H) 05/08/2021 1024   CREATININE 1.27 (H) 05/08/2021 1024      Component Value Date/Time   CALCIUM 10.0 05/08/2021 1024   ALKPHOS 55 05/08/2021 1024   AST 21 05/08/2021 1024   ALT 14 05/08/2021 1024   BILITOT 0.7 05/08/2021 1024      Impression and Plan: Jodi Woods is a a very charming 80 year old white female.  She has chronic phase CML.  She is now in a major molecular remission.  I am so happy for her.   Hopefully, she is still in a major molecular remission.  Again I try to have her understand that taking the Sprycel on schedule is certainly helpful.  Every other day is definitely doable.  I did look at her blood smear under the microscope.  She had good mature neutrophils.  I saw no blasts.  There were no nucleated red blood cells.  Platelets look fine.  We will now get her back next year.  We will plan to get her back probably in March.      Volanda Napoleon, MD 10/14/202211:51 AM

## 2021-09-18 ENCOUNTER — Other Ambulatory Visit (HOSPITAL_COMMUNITY): Payer: Self-pay

## 2021-09-18 ENCOUNTER — Telehealth: Payer: Self-pay

## 2021-09-18 LAB — BCR/ABL

## 2021-09-18 NOTE — Telephone Encounter (Signed)
-----   Message from Volanda Napoleon, MD sent at 09/18/2021 10:23 AM EDT ----- Call - the genetic test shows that the [facility] is in remission!!!  Great news!!  Laurey Arrow

## 2021-09-18 NOTE — Telephone Encounter (Signed)
Called and informed patient of results, patient verbalized understanding and denies any questions or concerns at this time.  ? ?

## 2021-09-20 ENCOUNTER — Other Ambulatory Visit (HOSPITAL_COMMUNITY): Payer: Self-pay

## 2021-09-20 MED FILL — Dasatinib Tab 50 MG: ORAL | 30 days supply | Qty: 15 | Fill #5 | Status: AC

## 2021-09-28 ENCOUNTER — Other Ambulatory Visit (HOSPITAL_COMMUNITY): Payer: Self-pay

## 2021-10-24 DIAGNOSIS — M1711 Unilateral primary osteoarthritis, right knee: Secondary | ICD-10-CM | POA: Diagnosis not present

## 2021-10-24 DIAGNOSIS — I10 Essential (primary) hypertension: Secondary | ICD-10-CM | POA: Diagnosis not present

## 2021-10-24 DIAGNOSIS — D649 Anemia, unspecified: Secondary | ICD-10-CM | POA: Diagnosis not present

## 2021-10-24 DIAGNOSIS — K219 Gastro-esophageal reflux disease without esophagitis: Secondary | ICD-10-CM | POA: Diagnosis not present

## 2021-10-25 ENCOUNTER — Other Ambulatory Visit (HOSPITAL_COMMUNITY): Payer: Self-pay

## 2021-10-25 MED FILL — Dasatinib Tab 50 MG: ORAL | 30 days supply | Qty: 15 | Fill #6 | Status: AC

## 2021-11-02 DIAGNOSIS — M1711 Unilateral primary osteoarthritis, right knee: Secondary | ICD-10-CM | POA: Diagnosis not present

## 2021-11-05 DIAGNOSIS — E538 Deficiency of other specified B group vitamins: Secondary | ICD-10-CM | POA: Diagnosis not present

## 2021-11-06 DIAGNOSIS — E538 Deficiency of other specified B group vitamins: Secondary | ICD-10-CM | POA: Diagnosis not present

## 2021-11-07 DIAGNOSIS — E538 Deficiency of other specified B group vitamins: Secondary | ICD-10-CM | POA: Diagnosis not present

## 2021-11-08 DIAGNOSIS — E538 Deficiency of other specified B group vitamins: Secondary | ICD-10-CM | POA: Diagnosis not present

## 2021-11-09 DIAGNOSIS — E538 Deficiency of other specified B group vitamins: Secondary | ICD-10-CM | POA: Diagnosis not present

## 2021-11-09 DIAGNOSIS — E7800 Pure hypercholesterolemia, unspecified: Secondary | ICD-10-CM | POA: Diagnosis not present

## 2021-11-09 DIAGNOSIS — J449 Chronic obstructive pulmonary disease, unspecified: Secondary | ICD-10-CM | POA: Diagnosis not present

## 2021-11-09 DIAGNOSIS — I10 Essential (primary) hypertension: Secondary | ICD-10-CM | POA: Diagnosis not present

## 2021-11-09 DIAGNOSIS — E039 Hypothyroidism, unspecified: Secondary | ICD-10-CM | POA: Diagnosis not present

## 2021-11-09 DIAGNOSIS — M159 Polyosteoarthritis, unspecified: Secondary | ICD-10-CM | POA: Diagnosis not present

## 2021-11-09 DIAGNOSIS — G8929 Other chronic pain: Secondary | ICD-10-CM | POA: Diagnosis not present

## 2021-11-09 DIAGNOSIS — K219 Gastro-esophageal reflux disease without esophagitis: Secondary | ICD-10-CM | POA: Diagnosis not present

## 2021-11-12 DIAGNOSIS — E538 Deficiency of other specified B group vitamins: Secondary | ICD-10-CM | POA: Diagnosis not present

## 2021-11-22 ENCOUNTER — Other Ambulatory Visit (HOSPITAL_COMMUNITY): Payer: Self-pay

## 2021-11-22 ENCOUNTER — Other Ambulatory Visit: Payer: Self-pay | Admitting: Hematology & Oncology

## 2021-11-22 DIAGNOSIS — E538 Deficiency of other specified B group vitamins: Secondary | ICD-10-CM | POA: Diagnosis not present

## 2021-11-22 DIAGNOSIS — C9210 Chronic myeloid leukemia, BCR/ABL-positive, not having achieved remission: Secondary | ICD-10-CM

## 2021-11-23 ENCOUNTER — Other Ambulatory Visit (HOSPITAL_COMMUNITY): Payer: Self-pay

## 2021-11-23 MED ORDER — DASATINIB 50 MG PO TABS
ORAL_TABLET | ORAL | 8 refills | Status: DC
  Filled 2021-11-23: qty 15, 30d supply, fill #0
  Filled 2021-12-31: qty 15, 30d supply, fill #1
  Filled 2022-02-07: qty 15, 30d supply, fill #2
  Filled 2022-03-21: qty 15, 30d supply, fill #3
  Filled 2022-04-17 – 2022-05-13 (×2): qty 15, 30d supply, fill #4
  Filled 2022-06-05: qty 15, 30d supply, fill #5
  Filled 2022-07-08: qty 15, 30d supply, fill #6
  Filled 2022-08-01: qty 15, 30d supply, fill #7
  Filled 2022-08-30: qty 15, 30d supply, fill #8

## 2021-11-26 ENCOUNTER — Other Ambulatory Visit (HOSPITAL_COMMUNITY): Payer: Self-pay

## 2021-12-11 DIAGNOSIS — I10 Essential (primary) hypertension: Secondary | ICD-10-CM | POA: Diagnosis not present

## 2021-12-11 DIAGNOSIS — M152 Bouchard's nodes (with arthropathy): Secondary | ICD-10-CM | POA: Diagnosis not present

## 2021-12-11 DIAGNOSIS — K219 Gastro-esophageal reflux disease without esophagitis: Secondary | ICD-10-CM | POA: Diagnosis not present

## 2021-12-11 DIAGNOSIS — M159 Polyosteoarthritis, unspecified: Secondary | ICD-10-CM | POA: Diagnosis not present

## 2021-12-11 DIAGNOSIS — E039 Hypothyroidism, unspecified: Secondary | ICD-10-CM | POA: Diagnosis not present

## 2021-12-11 DIAGNOSIS — J449 Chronic obstructive pulmonary disease, unspecified: Secondary | ICD-10-CM | POA: Diagnosis not present

## 2021-12-11 DIAGNOSIS — M19041 Primary osteoarthritis, right hand: Secondary | ICD-10-CM | POA: Diagnosis not present

## 2021-12-11 DIAGNOSIS — G8929 Other chronic pain: Secondary | ICD-10-CM | POA: Diagnosis not present

## 2021-12-11 DIAGNOSIS — E7800 Pure hypercholesterolemia, unspecified: Secondary | ICD-10-CM | POA: Diagnosis not present

## 2021-12-13 DIAGNOSIS — E538 Deficiency of other specified B group vitamins: Secondary | ICD-10-CM | POA: Diagnosis not present

## 2021-12-13 DIAGNOSIS — I10 Essential (primary) hypertension: Secondary | ICD-10-CM | POA: Diagnosis not present

## 2021-12-19 ENCOUNTER — Other Ambulatory Visit (HOSPITAL_COMMUNITY): Payer: Self-pay

## 2021-12-31 ENCOUNTER — Other Ambulatory Visit (HOSPITAL_COMMUNITY): Payer: Self-pay

## 2021-12-31 ENCOUNTER — Telehealth: Payer: Self-pay | Admitting: Pharmacy Technician

## 2021-12-31 NOTE — Telephone Encounter (Signed)
Oral Oncology Patient Advocate Encounter  Prior Authorization for Sprycel has been approved.    PA# [id number] Effective dates: 12/31/21 through 12/31/22  Oral Oncology Clinic will continue to follow.   [place] Patient [place] Phone [phone] Fax [phone] 12/31/2021 12:16 PM

## 2021-12-31 NOTE — Telephone Encounter (Signed)
Oral Oncology Patient Advocate Encounter   Received notification from [place] that the existing prior authorization for Sprycel is due for renewal.   Renewal PA submitted on CoverMyMeds Key [id number]  Status is pending   Oral Oncology Clinic will continue to follow.  [place] Patient [place] Phone [phone] Fax [phone] 12/31/2021 11:55 AM

## 2022-01-08 DIAGNOSIS — M1711 Unilateral primary osteoarthritis, right knee: Secondary | ICD-10-CM | POA: Diagnosis not present

## 2022-01-12 DIAGNOSIS — I10 Essential (primary) hypertension: Secondary | ICD-10-CM | POA: Diagnosis not present

## 2022-01-12 DIAGNOSIS — K219 Gastro-esophageal reflux disease without esophagitis: Secondary | ICD-10-CM | POA: Diagnosis not present

## 2022-01-12 DIAGNOSIS — C9210 Chronic myeloid leukemia, BCR/ABL-positive, not having achieved remission: Secondary | ICD-10-CM | POA: Diagnosis not present

## 2022-01-12 DIAGNOSIS — Z6830 Body mass index (BMI) 30.0-30.9, adult: Secondary | ICD-10-CM | POA: Diagnosis not present

## 2022-01-12 DIAGNOSIS — E669 Obesity, unspecified: Secondary | ICD-10-CM | POA: Diagnosis not present

## 2022-01-12 DIAGNOSIS — E039 Hypothyroidism, unspecified: Secondary | ICD-10-CM | POA: Diagnosis not present

## 2022-01-12 DIAGNOSIS — Z7982 Long term (current) use of aspirin: Secondary | ICD-10-CM | POA: Diagnosis not present

## 2022-01-12 DIAGNOSIS — E785 Hyperlipidemia, unspecified: Secondary | ICD-10-CM | POA: Diagnosis not present

## 2022-01-12 DIAGNOSIS — H547 Unspecified visual loss: Secondary | ICD-10-CM | POA: Diagnosis not present

## 2022-01-12 DIAGNOSIS — Z8249 Family history of ischemic heart disease and other diseases of the circulatory system: Secondary | ICD-10-CM | POA: Diagnosis not present

## 2022-01-24 ENCOUNTER — Inpatient Hospital Stay (HOSPITAL_BASED_OUTPATIENT_CLINIC_OR_DEPARTMENT_OTHER): Payer: Medicare HMO | Admitting: Hematology & Oncology

## 2022-01-24 ENCOUNTER — Inpatient Hospital Stay: Payer: Medicare HMO | Attending: Hematology & Oncology

## 2022-01-24 ENCOUNTER — Other Ambulatory Visit: Payer: Self-pay

## 2022-01-24 ENCOUNTER — Encounter: Payer: Self-pay | Admitting: Hematology & Oncology

## 2022-01-24 VITALS — BP 131/49 | HR 66 | Temp 98.5°F | Resp 18 | Wt 161.0 lb

## 2022-01-24 DIAGNOSIS — C9210 Chronic myeloid leukemia, BCR/ABL-positive, not having achieved remission: Secondary | ICD-10-CM

## 2022-01-24 DIAGNOSIS — C9211 Chronic myeloid leukemia, BCR/ABL-positive, in remission: Secondary | ICD-10-CM | POA: Diagnosis not present

## 2022-01-24 DIAGNOSIS — I10 Essential (primary) hypertension: Secondary | ICD-10-CM | POA: Diagnosis not present

## 2022-01-24 DIAGNOSIS — E538 Deficiency of other specified B group vitamins: Secondary | ICD-10-CM | POA: Diagnosis not present

## 2022-01-24 DIAGNOSIS — Z880 Allergy status to penicillin: Secondary | ICD-10-CM | POA: Diagnosis not present

## 2022-01-24 DIAGNOSIS — N289 Disorder of kidney and ureter, unspecified: Secondary | ICD-10-CM | POA: Diagnosis not present

## 2022-01-24 DIAGNOSIS — R944 Abnormal results of kidney function studies: Secondary | ICD-10-CM | POA: Diagnosis not present

## 2022-01-24 LAB — CMP (CANCER CENTER ONLY)
ALT: 13 U/L (ref 0–44)
AST: 18 U/L (ref 15–41)
Albumin: 4 g/dL (ref 3.5–5.0)
Alkaline Phosphatase: 61 U/L (ref 38–126)
Anion gap: 7 (ref 5–15)
BUN: 36 mg/dL — ABNORMAL HIGH (ref 8–23)
CO2: 26 mmol/L (ref 22–32)
Calcium: 9.6 mg/dL (ref 8.9–10.3)
Chloride: 103 mmol/L (ref 98–111)
Creatinine: 1.34 mg/dL — ABNORMAL HIGH (ref 0.44–1.00)
GFR, Estimated: 40 mL/min — ABNORMAL LOW (ref >60)
Glucose, Bld: 95 mg/dL (ref 70–99)
Potassium: 4.8 mmol/L (ref 3.5–5.1)
Sodium: 136 mmol/L (ref 135–145)
Total Bilirubin: 0.7 mg/dL (ref 0.3–1.2)
Total Protein: 6.7 g/dL (ref 6.5–8.1)

## 2022-01-24 LAB — SAVE SMEAR(SSMR), FOR PROVIDER SLIDE REVIEW

## 2022-01-24 LAB — CBC WITH DIFFERENTIAL (CANCER CENTER ONLY)
Abs Immature Granulocytes: 0.02 10*3/uL (ref 0.00–0.07)
Basophils Absolute: 0 10*3/uL (ref 0.0–0.1)
Basophils Relative: 0 %
Eosinophils Absolute: 0.2 10*3/uL (ref 0.0–0.5)
Eosinophils Relative: 2 %
HCT: 35.5 % — ABNORMAL LOW (ref 36.0–46.0)
Hemoglobin: 11.6 g/dL — ABNORMAL LOW (ref 12.0–15.0)
Immature Granulocytes: 0 %
Lymphocytes Relative: 25 %
Lymphs Abs: 1.7 10*3/uL (ref 0.7–4.0)
MCH: 31.4 pg (ref 26.0–34.0)
MCHC: 32.7 g/dL (ref 30.0–36.0)
MCV: 95.9 fL (ref 80.0–100.0)
Monocytes Absolute: 0.5 10*3/uL (ref 0.1–1.0)
Monocytes Relative: 8 %
Neutro Abs: 4.4 10*3/uL (ref 1.7–7.7)
Neutrophils Relative %: 65 %
Platelet Count: 180 10*3/uL (ref 150–400)
RBC: 3.7 MIL/uL — ABNORMAL LOW (ref 3.87–5.11)
RDW: 13 % (ref 11.5–15.5)
WBC Count: 6.9 10*3/uL (ref 4.0–10.5)
nRBC: 0 % (ref 0.0–0.2)

## 2022-01-24 MED ORDER — ONDANSETRON HCL 8 MG PO TABS: 8.0000 mg | ORAL_TABLET | Freq: Three times a day (TID) | ORAL | 3 refills | Status: DC | PRN

## 2022-01-24 NOTE — Progress Notes (Signed)
?Hematology and Oncology Follow Up Visit ? ?Jodi Woods ?[id number] ?Jul 10, 1941 81 y.o. ?01/24/2022 ? ? ?Principle Diagnosis:  ?CML - Chronic Phase -- MMR ? ?Current Therapy:   ?Sprycel 50 mg po q other day -- change on 05/01/2020 ?    ?Interim History:  Jodi Woods is in for follow-up.  He is here every 6 months.  She been doing pretty well.  Her daughter, unfortunately, has metastatic breast cancer.  She is doing okay right now.  She seems to be on fairly aggressive chemotherapy. ? ?Jodi Woods has little bit of nausea.  This could be from the Sprycel.  I will send in some Zofran (8 mg p.o. every 8 hours as needed) to see if this may help. ? ?Her last BCR/ABL was not detectable. ? ?She has had no problems with cough.  There is no chest pain.  There is no fever.  She has had no rashes.  She has had no bleeding. ? ?Overall, I would say performance status is ECOG 1.   ? ?Medications:  ?Current Outpatient Medications:  ?  chlorthalidone (HYGROTON) 25 MG tablet, Take 25 mg by mouth daily., Disp: , Rfl:  ?  ondansetron (ZOFRAN) 8 MG tablet, Take 1 tablet (8 mg total) by mouth every 8 (eight) hours as needed for nausea or vomiting., Disp: 40 tablet, Rfl: 3 ?  telmisartan (MICARDIS) 80 MG tablet, Take 80 mg by mouth daily., Disp: , Rfl:  ?  acetaminophen (TYLENOL) 650 MG CR tablet, 2 tab, Disp: , Rfl:  ?  amLODipine (NORVASC) 2.5 MG tablet, Take 2.5 mg by mouth daily., Disp: , Rfl:  ?  aspirin EC 81 MG tablet, Take 81 mg by mouth daily., Disp: , Rfl:  ?  atorvastatin (LIPITOR) 20 MG tablet, Take 20 mg by mouth daily., Disp: , Rfl:  ?  cholecalciferol (VITAMIN D3) 25 MCG (1000 UT) tablet, Take 1,000 Units by mouth daily., Disp: , Rfl:  ?  Cyanocobalamin (VITAMIN B12) 1000 MCG TBCR, Take 1,000 mcg by mouth daily., Disp: , Rfl:  ?  dasatinib (SPRYCEL) 50 MG tablet, TAKE 1 TABLET (50 MG TOTAL) BY MOUTH EVERY OTHER DAY., Disp: 15 tablet, Rfl: 8 ?  famotidine (PEPCID) 20 MG tablet, Take 20 mg by mouth 2 (two) times daily.,  Disp: , Rfl:  ?  levothyroxine (SYNTHROID) 100 MCG tablet, Take 100 mcg by mouth daily before breakfast., Disp: , Rfl:  ?  naproxen sodium (ALEVE) 220 MG tablet, 2 tablets with food or milk as needed, Disp: , Rfl:  ? ?Allergies:  ?Allergies  ?Allergen Reactions  ? Amlodipine Besylate Other (See Comments)  ? Amoxicillin-Pot Clavulanate Other (See Comments)  ? ? ?Past Medical History, Surgical history, Social history, and Family History were reviewed and updated. ? ?Review of Systems: ?Review of Systems  ?Constitutional: Negative.   ?HENT:  Negative.    ?Eyes: Negative.   ?Respiratory: Negative.    ?Cardiovascular: Negative.   ?Gastrointestinal: Negative.   ?Endocrine: Negative.   ?Genitourinary: Negative.    ?Musculoskeletal: Negative.   ?Skin: Negative.   ?Neurological: Negative.   ?Hematological: Negative.   ?Psychiatric/Behavioral: Negative.    ? ?Physical Exam: ? weight is 161 lb (73 kg). Her oral temperature is 98.5 ?F (36.9 ?C). Her blood pressure is 131/49 (abnormal) and her pulse is 66. Her respiration is 18 and oxygen saturation is 100%.  ? ?Wt Readings from Last 3 Encounters:  ?01/24/22 161 lb (73 kg)  ?09/07/21 160 lb (72.6 kg)  ?05/08/21 162 lb (73.5  kg)  ? ? ?Physical Exam ?Vitals reviewed.  ?HENT:  ?   Head: Normocephalic and atraumatic.  ?Eyes:  ?   Pupils: Pupils are equal, round, and reactive to light.  ?Cardiovascular:  ?   Rate and Rhythm: Normal rate and regular rhythm.  ?   Heart sounds: Normal heart sounds.  ?Pulmonary:  ?   Effort: Pulmonary effort is normal.  ?   Breath sounds: Normal breath sounds.  ?Abdominal:  ?   General: Bowel sounds are normal.  ?   Palpations: Abdomen is soft.  ?Musculoskeletal:     ?   General: No tenderness or deformity. Normal range of motion.  ?   Cervical back: Normal range of motion.  ?Lymphadenopathy:  ?   Cervical: No cervical adenopathy.  ?Skin: ?   General: Skin is warm and dry.  ?   Findings: No erythema or rash.  ?Neurological:  ?   Mental Status: She is  alert and oriented to person, place, and time.  ?Psychiatric:     ?   Behavior: Behavior normal.     ?   Thought Content: Thought content normal.     ?   Judgment: Judgment normal.  ? ? ? ?Lab Results  ?Component Value Date  ? WBC 6.9 01/24/2022  ? HGB 11.6 (L) 01/24/2022  ? HCT 35.5 (L) 01/24/2022  ? MCV 95.9 01/24/2022  ? PLT 180 01/24/2022  ? ?  Chemistry   ?   ?Component Value Date/Time  ? NA 136 01/24/2022 1205  ? K 4.8 01/24/2022 1205  ? CL 103 01/24/2022 1205  ? CO2 26 01/24/2022 1205  ? BUN 36 (H) 01/24/2022 1205  ? CREATININE 1.34 (H) 01/24/2022 1205  ?    ?Component Value Date/Time  ? CALCIUM 9.6 01/24/2022 1205  ? ALKPHOS 61 01/24/2022 1205  ? AST 18 01/24/2022 1205  ? ALT 13 01/24/2022 1205  ? BILITOT 0.7 01/24/2022 1205  ?  ? ? ?Impression and Plan: ?Jodi Woods is a a very charming 81 year old white female.  She has chronic phase CML.  She is now in a major molecular remission.  I am so happy for her.  ? ?Hopefully, she is still in a major molecular remission.  She seems to be pretty diligent with taking the Sprycel. ? ?I will still plan to get her back in 6 months. ? ?I am just glad that her quality life seems to be doing quite well. ? ? ? ?Volanda Napoleon, MD ?3/2/20231:15 PM ?

## 2022-02-06 ENCOUNTER — Telehealth: Payer: Self-pay | Admitting: *Deleted

## 2022-02-06 LAB — BCR/ABL

## 2022-02-06 NOTE — Telephone Encounter (Signed)
Pt notified per order of Dr. Marin Olp "that the leukemia is is remission.  Thanks.  Jodi Woods"  Pt appreciative of call and has no questions or concerns at this time.  ? ? ?

## 2022-02-06 NOTE — Telephone Encounter (Signed)
-----   Message from Volanda Napoleon, MD sent at 02/06/2022  9:36 AM EDT ----- ?Please call her and let her know that the leukemia is in remission.  Thanks.  Pete ?

## 2022-02-07 ENCOUNTER — Other Ambulatory Visit (HOSPITAL_COMMUNITY): Payer: Self-pay

## 2022-02-12 DIAGNOSIS — R7989 Other specified abnormal findings of blood chemistry: Secondary | ICD-10-CM | POA: Diagnosis not present

## 2022-02-12 DIAGNOSIS — N3941 Urge incontinence: Secondary | ICD-10-CM | POA: Diagnosis not present

## 2022-02-12 DIAGNOSIS — R748 Abnormal levels of other serum enzymes: Secondary | ICD-10-CM | POA: Diagnosis not present

## 2022-02-12 DIAGNOSIS — I10 Essential (primary) hypertension: Secondary | ICD-10-CM | POA: Diagnosis not present

## 2022-02-14 ENCOUNTER — Other Ambulatory Visit (HOSPITAL_COMMUNITY): Payer: Self-pay

## 2022-03-05 ENCOUNTER — Other Ambulatory Visit (HOSPITAL_COMMUNITY): Payer: Self-pay

## 2022-03-15 DIAGNOSIS — I10 Essential (primary) hypertension: Secondary | ICD-10-CM | POA: Diagnosis not present

## 2022-03-15 DIAGNOSIS — R609 Edema, unspecified: Secondary | ICD-10-CM | POA: Diagnosis not present

## 2022-03-15 DIAGNOSIS — R0609 Other forms of dyspnea: Secondary | ICD-10-CM | POA: Diagnosis not present

## 2022-03-21 ENCOUNTER — Other Ambulatory Visit (HOSPITAL_COMMUNITY): Payer: Self-pay

## 2022-03-22 DIAGNOSIS — M1711 Unilateral primary osteoarthritis, right knee: Secondary | ICD-10-CM | POA: Diagnosis not present

## 2022-03-26 ENCOUNTER — Other Ambulatory Visit (HOSPITAL_COMMUNITY): Payer: Self-pay

## 2022-04-02 DIAGNOSIS — M1711 Unilateral primary osteoarthritis, right knee: Secondary | ICD-10-CM | POA: Diagnosis not present

## 2022-04-02 DIAGNOSIS — M6281 Muscle weakness (generalized): Secondary | ICD-10-CM | POA: Diagnosis not present

## 2022-04-03 DIAGNOSIS — H43812 Vitreous degeneration, left eye: Secondary | ICD-10-CM | POA: Diagnosis not present

## 2022-04-03 DIAGNOSIS — H4720 Unspecified optic atrophy: Secondary | ICD-10-CM | POA: Diagnosis not present

## 2022-04-04 DIAGNOSIS — K219 Gastro-esophageal reflux disease without esophagitis: Secondary | ICD-10-CM | POA: Diagnosis not present

## 2022-04-04 DIAGNOSIS — N3281 Overactive bladder: Secondary | ICD-10-CM | POA: Diagnosis not present

## 2022-04-04 DIAGNOSIS — I10 Essential (primary) hypertension: Secondary | ICD-10-CM | POA: Diagnosis not present

## 2022-04-04 DIAGNOSIS — E7800 Pure hypercholesterolemia, unspecified: Secondary | ICD-10-CM | POA: Diagnosis not present

## 2022-04-04 DIAGNOSIS — Z0000 Encounter for general adult medical examination without abnormal findings: Secondary | ICD-10-CM | POA: Diagnosis not present

## 2022-04-04 DIAGNOSIS — E039 Hypothyroidism, unspecified: Secondary | ICD-10-CM | POA: Diagnosis not present

## 2022-04-04 DIAGNOSIS — I700 Atherosclerosis of aorta: Secondary | ICD-10-CM | POA: Diagnosis not present

## 2022-04-04 DIAGNOSIS — Z1331 Encounter for screening for depression: Secondary | ICD-10-CM | POA: Diagnosis not present

## 2022-04-09 DIAGNOSIS — M6281 Muscle weakness (generalized): Secondary | ICD-10-CM | POA: Diagnosis not present

## 2022-04-09 DIAGNOSIS — M1711 Unilateral primary osteoarthritis, right knee: Secondary | ICD-10-CM | POA: Diagnosis not present

## 2022-04-12 DIAGNOSIS — M1711 Unilateral primary osteoarthritis, right knee: Secondary | ICD-10-CM | POA: Diagnosis not present

## 2022-04-12 DIAGNOSIS — M6281 Muscle weakness (generalized): Secondary | ICD-10-CM | POA: Diagnosis not present

## 2022-04-16 DIAGNOSIS — M6281 Muscle weakness (generalized): Secondary | ICD-10-CM | POA: Diagnosis not present

## 2022-04-16 DIAGNOSIS — M1711 Unilateral primary osteoarthritis, right knee: Secondary | ICD-10-CM | POA: Diagnosis not present

## 2022-04-17 ENCOUNTER — Other Ambulatory Visit (HOSPITAL_COMMUNITY): Payer: Self-pay

## 2022-04-19 ENCOUNTER — Emergency Department (HOSPITAL_BASED_OUTPATIENT_CLINIC_OR_DEPARTMENT_OTHER)
Admission: EM | Admit: 2022-04-19 | Discharge: 2022-04-19 | Disposition: A | Discharge status: Home / Self Care | Payer: Medicare HMO | Attending: Emergency Medicine | Admitting: Emergency Medicine

## 2022-04-19 ENCOUNTER — Other Ambulatory Visit: Payer: Self-pay

## 2022-04-19 ENCOUNTER — Emergency Department (HOSPITAL_BASED_OUTPATIENT_CLINIC_OR_DEPARTMENT_OTHER): Payer: Medicare HMO

## 2022-04-19 ENCOUNTER — Encounter (HOSPITAL_BASED_OUTPATIENT_CLINIC_OR_DEPARTMENT_OTHER): Payer: Self-pay

## 2022-04-19 DIAGNOSIS — Z7982 Long term (current) use of aspirin: Secondary | ICD-10-CM | POA: Diagnosis not present

## 2022-04-19 DIAGNOSIS — M6281 Muscle weakness (generalized): Secondary | ICD-10-CM | POA: Diagnosis not present

## 2022-04-19 DIAGNOSIS — M1711 Unilateral primary osteoarthritis, right knee: Secondary | ICD-10-CM | POA: Diagnosis not present

## 2022-04-19 DIAGNOSIS — W01198A Fall on same level from slipping, tripping and stumbling with subsequent striking against other object, initial encounter: Secondary | ICD-10-CM | POA: Insufficient documentation

## 2022-04-19 DIAGNOSIS — S0990XA Unspecified injury of head, initial encounter: Secondary | ICD-10-CM | POA: Diagnosis not present

## 2022-04-19 DIAGNOSIS — S0083XA Contusion of other part of head, initial encounter: Secondary | ICD-10-CM | POA: Insufficient documentation

## 2022-04-19 HISTORY — DX: Disorder of thyroid, unspecified: E07.9

## 2022-04-19 HISTORY — DX: Essential (primary) hypertension: I10

## 2022-04-19 HISTORY — DX: Pure hypercholesterolemia, unspecified: E78.00

## 2022-04-19 NOTE — Discharge Instructions (Addendum)
Follow-up with your primary care doctor for recheck next week.  Come back to ER if you develop neck pain, episodes of passing out, lightheadedness, confusion, or other new concerning symptom.

## 2022-04-19 NOTE — ED Provider Notes (Signed)
[facility] EMERGENCY DEPT Provider Note   CSN: [id number] Arrival date & time: 04/19/22  1234     History {Add pertinent medical, surgical, social history, OB history to HPI:1} Chief Complaint  Patient presents with   Jodi Woods is a 81 y.o. female.  HPI     Home Medications Prior to Admission medications   Medication Sig Start Date End Date Taking? Authorizing Provider  acetaminophen (TYLENOL) 650 MG CR tablet 2 tab    [provider]  amLODipine (NORVASC) 2.5 MG tablet Take 2.5 mg by mouth daily. 12/13/21   [provider]  aspirin EC 81 MG tablet Take 81 mg by mouth daily.    [provider]  atorvastatin (LIPITOR) 20 MG tablet Take 20 mg by mouth daily. 07/22/20   [provider]  chlorthalidone (HYGROTON) 25 MG tablet Take 25 mg by mouth daily. 10/24/21   [provider]  cholecalciferol (VITAMIN D3) 25 MCG (1000 UT) tablet Take 1,000 Units by mouth daily.    [provider]  Cyanocobalamin (VITAMIN B12) 1000 MCG TBCR Take 1,000 mcg by mouth daily.    [provider]  dasatinib (SPRYCEL) 50 MG tablet TAKE 1 TABLET (50 MG TOTAL) BY MOUTH EVERY OTHER DAY. 11/23/21 11/23/22  Volanda Napoleon, MD  famotidine (PEPCID) 20 MG tablet Take 20 mg by mouth 2 (two) times daily. 07/14/20   [provider]  levothyroxine (SYNTHROID) 100 MCG tablet Take 100 mcg by mouth daily before breakfast.    [provider]  naproxen sodium (ALEVE) 220 MG tablet 2 tablets with food or milk as needed    [provider]  ondansetron (ZOFRAN) 8 MG tablet Take 1 tablet (8 mg total) by mouth every 8 (eight) hours as needed for nausea or vomiting. 01/24/22   Volanda Napoleon, MD  telmisartan (MICARDIS) 80 MG tablet Take 80 mg by mouth daily. 10/24/21   [provider]      Allergies    Amlodipine besylate and Amoxicillin-pot clavulanate    Review of Systems   Review of  Systems  Physical Exam Updated Vital Signs BP (!) 177/64 (BP Location: Right Arm)   Pulse (!) 59   Temp 98.1 F (36.7 C)   Ht '5\' 1"'$  (1.549 m)   Wt 72.6 kg   SpO2 100%   BMI 30.23 kg/m  Physical Exam  ED Results / Procedures / Treatments   Labs (all labs ordered are listed, but only abnormal results are displayed) Labs Reviewed - No data to display  EKG None  Radiology No results found.  Procedures Procedures  {Document cardiac monitor, telemetry assessment procedure when appropriate:1}  Medications Ordered in ED Medications - No data to display  ED Course/ Medical Decision Making/ A&P                           Medical Decision Making  ***  {Document critical care time when appropriate:1} {Document review of labs and clinical decision tools ie heart score, Chads2Vasc2 etc:1}  {Document your independent review of radiology images, and any outside records:1} {Document your discussion with family members, caretakers, and with consultants:1} {Document social determinants of health affecting pt's care:1} {Document your decision making why or why not admission, treatments were needed:1} Final Clinical Impression(s) / ED Diagnoses Final diagnoses:  None    Rx / DC Orders ED Discharge Orders     None

## 2022-04-19 NOTE — ED Triage Notes (Addendum)
Pt slippled on the grass last PM. Pt has injury to L orbit and L hand. Pt denies being on anticoagulants. Denies LOC. Pt A/Ox4 during triage.

## 2022-04-24 DIAGNOSIS — M6281 Muscle weakness (generalized): Secondary | ICD-10-CM | POA: Diagnosis not present

## 2022-04-24 DIAGNOSIS — M1711 Unilateral primary osteoarthritis, right knee: Secondary | ICD-10-CM | POA: Diagnosis not present

## 2022-04-25 ENCOUNTER — Ambulatory Visit
Admission: RE | Admit: 2022-04-25 | Discharge: 2022-04-25 | Disposition: A | Discharge status: Home / Self Care | Payer: Medicare HMO | Source: Ambulatory Visit | Attending: Physician Assistant | Admitting: Physician Assistant

## 2022-04-25 ENCOUNTER — Other Ambulatory Visit: Payer: Self-pay | Admitting: Physician Assistant

## 2022-04-25 DIAGNOSIS — S6992XD Unspecified injury of left wrist, hand and finger(s), subsequent encounter: Secondary | ICD-10-CM | POA: Diagnosis not present

## 2022-04-25 DIAGNOSIS — S6992XA Unspecified injury of left wrist, hand and finger(s), initial encounter: Secondary | ICD-10-CM

## 2022-04-25 DIAGNOSIS — I10 Essential (primary) hypertension: Secondary | ICD-10-CM | POA: Diagnosis not present

## 2022-04-26 DIAGNOSIS — M1711 Unilateral primary osteoarthritis, right knee: Secondary | ICD-10-CM | POA: Diagnosis not present

## 2022-04-26 DIAGNOSIS — M6281 Muscle weakness (generalized): Secondary | ICD-10-CM | POA: Diagnosis not present

## 2022-04-30 DIAGNOSIS — M25532 Pain in left wrist: Secondary | ICD-10-CM | POA: Diagnosis not present

## 2022-05-01 DIAGNOSIS — M1711 Unilateral primary osteoarthritis, right knee: Secondary | ICD-10-CM | POA: Diagnosis not present

## 2022-05-01 DIAGNOSIS — M6281 Muscle weakness (generalized): Secondary | ICD-10-CM | POA: Diagnosis not present

## 2022-05-02 DIAGNOSIS — H3582 Retinal ischemia: Secondary | ICD-10-CM | POA: Diagnosis not present

## 2022-05-02 DIAGNOSIS — H43812 Vitreous degeneration, left eye: Secondary | ICD-10-CM | POA: Diagnosis not present

## 2022-05-02 DIAGNOSIS — H348312 Tributary (branch) retinal vein occlusion, right eye, stable: Secondary | ICD-10-CM | POA: Diagnosis not present

## 2022-05-02 DIAGNOSIS — H35371 Puckering of macula, right eye: Secondary | ICD-10-CM | POA: Diagnosis not present

## 2022-05-02 DIAGNOSIS — H35033 Hypertensive retinopathy, bilateral: Secondary | ICD-10-CM | POA: Diagnosis not present

## 2022-05-06 DIAGNOSIS — R42 Dizziness and giddiness: Secondary | ICD-10-CM | POA: Diagnosis not present

## 2022-05-06 DIAGNOSIS — E038 Other specified hypothyroidism: Secondary | ICD-10-CM | POA: Diagnosis not present

## 2022-05-13 ENCOUNTER — Other Ambulatory Visit (HOSPITAL_COMMUNITY): Payer: Self-pay

## 2022-05-17 DIAGNOSIS — M436 Torticollis: Secondary | ICD-10-CM | POA: Diagnosis not present

## 2022-05-17 DIAGNOSIS — Z7689 Persons encountering health services in other specified circumstances: Secondary | ICD-10-CM | POA: Diagnosis not present

## 2022-05-17 DIAGNOSIS — I10 Essential (primary) hypertension: Secondary | ICD-10-CM | POA: Diagnosis not present

## 2022-05-20 DIAGNOSIS — I10 Essential (primary) hypertension: Secondary | ICD-10-CM | POA: Diagnosis not present

## 2022-05-20 DIAGNOSIS — E039 Hypothyroidism, unspecified: Secondary | ICD-10-CM | POA: Diagnosis not present

## 2022-05-21 DIAGNOSIS — S52572A Other intraarticular fracture of lower end of left radius, initial encounter for closed fracture: Secondary | ICD-10-CM | POA: Diagnosis not present

## 2022-05-21 DIAGNOSIS — M25532 Pain in left wrist: Secondary | ICD-10-CM | POA: Diagnosis not present

## 2022-06-05 ENCOUNTER — Other Ambulatory Visit (HOSPITAL_COMMUNITY): Payer: Self-pay

## 2022-06-12 ENCOUNTER — Other Ambulatory Visit (HOSPITAL_COMMUNITY): Payer: Self-pay

## 2022-06-14 DIAGNOSIS — M436 Torticollis: Secondary | ICD-10-CM | POA: Diagnosis not present

## 2022-06-14 DIAGNOSIS — I10 Essential (primary) hypertension: Secondary | ICD-10-CM | POA: Diagnosis not present

## 2022-06-14 DIAGNOSIS — M179 Osteoarthritis of knee, unspecified: Secondary | ICD-10-CM | POA: Diagnosis not present

## 2022-06-18 DIAGNOSIS — S52572D Other intraarticular fracture of lower end of left radius, subsequent encounter for closed fracture with routine healing: Secondary | ICD-10-CM | POA: Diagnosis not present

## 2022-06-18 DIAGNOSIS — M1812 Unilateral primary osteoarthritis of first carpometacarpal joint, left hand: Secondary | ICD-10-CM | POA: Diagnosis not present

## 2022-07-05 DIAGNOSIS — Z1231 Encounter for screening mammogram for malignant neoplasm of breast: Secondary | ICD-10-CM | POA: Diagnosis not present

## 2022-07-08 ENCOUNTER — Other Ambulatory Visit (HOSPITAL_COMMUNITY): Payer: Self-pay

## 2022-07-09 DIAGNOSIS — M1711 Unilateral primary osteoarthritis, right knee: Secondary | ICD-10-CM | POA: Diagnosis not present

## 2022-07-10 ENCOUNTER — Other Ambulatory Visit (HOSPITAL_COMMUNITY): Payer: Self-pay

## 2022-07-11 DIAGNOSIS — Z1331 Encounter for screening for depression: Secondary | ICD-10-CM | POA: Diagnosis not present

## 2022-07-11 DIAGNOSIS — I10 Essential (primary) hypertension: Secondary | ICD-10-CM | POA: Diagnosis not present

## 2022-07-11 DIAGNOSIS — C9210 Chronic myeloid leukemia, BCR/ABL-positive, not having achieved remission: Secondary | ICD-10-CM | POA: Diagnosis not present

## 2022-07-11 DIAGNOSIS — E039 Hypothyroidism, unspecified: Secondary | ICD-10-CM | POA: Diagnosis not present

## 2022-07-11 DIAGNOSIS — Z1339 Encounter for screening examination for other mental health and behavioral disorders: Secondary | ICD-10-CM | POA: Diagnosis not present

## 2022-07-11 DIAGNOSIS — Z0000 Encounter for general adult medical examination without abnormal findings: Secondary | ICD-10-CM | POA: Diagnosis not present

## 2022-07-11 DIAGNOSIS — R32 Unspecified urinary incontinence: Secondary | ICD-10-CM | POA: Diagnosis not present

## 2022-07-11 DIAGNOSIS — R7989 Other specified abnormal findings of blood chemistry: Secondary | ICD-10-CM | POA: Diagnosis not present

## 2022-07-11 DIAGNOSIS — Z23 Encounter for immunization: Secondary | ICD-10-CM | POA: Diagnosis not present

## 2022-07-16 DIAGNOSIS — M1711 Unilateral primary osteoarthritis, right knee: Secondary | ICD-10-CM | POA: Diagnosis not present

## 2022-07-17 DIAGNOSIS — M47812 Spondylosis without myelopathy or radiculopathy, cervical region: Secondary | ICD-10-CM | POA: Diagnosis not present

## 2022-07-23 DIAGNOSIS — M1711 Unilateral primary osteoarthritis, right knee: Secondary | ICD-10-CM | POA: Diagnosis not present

## 2022-07-26 DIAGNOSIS — M7912 Myalgia of auxiliary muscles, head and neck: Secondary | ICD-10-CM | POA: Diagnosis not present

## 2022-07-26 DIAGNOSIS — M47812 Spondylosis without myelopathy or radiculopathy, cervical region: Secondary | ICD-10-CM | POA: Diagnosis not present

## 2022-07-26 DIAGNOSIS — M6281 Muscle weakness (generalized): Secondary | ICD-10-CM | POA: Diagnosis not present

## 2022-07-30 DIAGNOSIS — M6281 Muscle weakness (generalized): Secondary | ICD-10-CM | POA: Diagnosis not present

## 2022-07-30 DIAGNOSIS — M7912 Myalgia of auxiliary muscles, head and neck: Secondary | ICD-10-CM | POA: Diagnosis not present

## 2022-07-30 DIAGNOSIS — M47812 Spondylosis without myelopathy or radiculopathy, cervical region: Secondary | ICD-10-CM | POA: Diagnosis not present

## 2022-08-01 ENCOUNTER — Other Ambulatory Visit (HOSPITAL_COMMUNITY): Payer: Self-pay

## 2022-08-01 ENCOUNTER — Inpatient Hospital Stay: Payer: Medicare HMO | Attending: Hematology & Oncology

## 2022-08-01 ENCOUNTER — Inpatient Hospital Stay (HOSPITAL_BASED_OUTPATIENT_CLINIC_OR_DEPARTMENT_OTHER): Payer: Medicare HMO | Admitting: Hematology & Oncology

## 2022-08-01 ENCOUNTER — Encounter: Payer: Self-pay | Admitting: Hematology & Oncology

## 2022-08-01 VITALS — BP 171/48 | HR 67 | Temp 97.6°F | Resp 18 | Wt 162.8 lb

## 2022-08-01 DIAGNOSIS — C9210 Chronic myeloid leukemia, BCR/ABL-positive, not having achieved remission: Secondary | ICD-10-CM | POA: Diagnosis not present

## 2022-08-01 DIAGNOSIS — C9211 Chronic myeloid leukemia, BCR/ABL-positive, in remission: Secondary | ICD-10-CM | POA: Diagnosis not present

## 2022-08-01 LAB — CMP (CANCER CENTER ONLY)
ALT: 16 U/L (ref 0–44)
AST: 22 U/L (ref 15–41)
Albumin: 4.3 g/dL (ref 3.5–5.0)
Alkaline Phosphatase: 58 U/L (ref 38–126)
Anion gap: 6 (ref 5–15)
BUN: 26 mg/dL — ABNORMAL HIGH (ref 8–23)
CO2: 27 mmol/L (ref 22–32)
Calcium: 10 mg/dL (ref 8.9–10.3)
Chloride: 105 mmol/L (ref 98–111)
Creatinine: 1.19 mg/dL — ABNORMAL HIGH (ref 0.44–1.00)
GFR, Estimated: 46 mL/min — ABNORMAL LOW (ref >60)
Glucose, Bld: 99 mg/dL (ref 70–99)
Potassium: 4.7 mmol/L (ref 3.5–5.1)
Sodium: 138 mmol/L (ref 135–145)
Total Bilirubin: 0.7 mg/dL (ref 0.3–1.2)
Total Protein: 6.7 g/dL (ref 6.5–8.1)

## 2022-08-01 LAB — CBC WITH DIFFERENTIAL (CANCER CENTER ONLY)
Abs Immature Granulocytes: 0.02 10*3/uL (ref 0.00–0.07)
Basophils Absolute: 0 10*3/uL (ref 0.0–0.1)
Basophils Relative: 0 %
Eosinophils Absolute: 0.2 10*3/uL (ref 0.0–0.5)
Eosinophils Relative: 4 %
HCT: 33 % — ABNORMAL LOW (ref 36.0–46.0)
Hemoglobin: 10.7 g/dL — ABNORMAL LOW (ref 12.0–15.0)
Immature Granulocytes: 0 %
Lymphocytes Relative: 29 %
Lymphs Abs: 1.4 10*3/uL (ref 0.7–4.0)
MCH: 31.8 pg (ref 26.0–34.0)
MCHC: 32.4 g/dL (ref 30.0–36.0)
MCV: 97.9 fL (ref 80.0–100.0)
Monocytes Absolute: 0.5 10*3/uL (ref 0.1–1.0)
Monocytes Relative: 10 %
Neutro Abs: 2.7 10*3/uL (ref 1.7–7.7)
Neutrophils Relative %: 57 %
Platelet Count: 162 10*3/uL (ref 150–400)
RBC: 3.37 MIL/uL — ABNORMAL LOW (ref 3.87–5.11)
RDW: 12.9 % (ref 11.5–15.5)
WBC Count: 4.7 10*3/uL (ref 4.0–10.5)
nRBC: 0 % (ref 0.0–0.2)

## 2022-08-01 LAB — SAVE SMEAR(SSMR), FOR PROVIDER SLIDE REVIEW

## 2022-08-01 LAB — LACTATE DEHYDROGENASE: LDH: 183 U/L (ref 98–192)

## 2022-08-01 NOTE — Progress Notes (Signed)
Hematology and Oncology Follow Up Visit  Jodi Woods [id number] Mar 29, 1941 81 y.o. 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  We see her every 6 months.  She has had no problems since we last saw her.  There has been no issues with fever.  She has had no nausea or vomiting.  She has had no problems with the Sprycel.  She only takes the Sprycel every other day.  When we last saw her, her BCR/ABL was nondetectable.  She has had no rashes.  There is been no leg swelling.  She has had no diarrhea.  She has had no headache.  Overall, I would have to say that her performance status is probably ECOG 0.   Medications:  Current Outpatient Medications:    acetaminophen (TYLENOL) 650 MG CR tablet, 2 tab, Disp: , Rfl:    aspirin EC 81 MG tablet, Take 81 mg by mouth daily., Disp: , Rfl:    atorvastatin (LIPITOR) 20 MG tablet, Take 20 mg by mouth daily., Disp: , Rfl:    cholecalciferol (VITAMIN D3) 25 MCG (1000 UT) tablet, Take 1,000 Units by mouth daily., Disp: , Rfl:    Cyanocobalamin (VITAMIN B12) 1000 MCG TBCR, Take 1,000 mcg by mouth daily., Disp: , Rfl:    dasatinib (SPRYCEL) 50 MG tablet, TAKE 1 TABLET (50 MG TOTAL) BY MOUTH EVERY OTHER DAY., Disp: 15 tablet, Rfl: 8   famotidine (PEPCID) 20 MG tablet, Take 20 mg by mouth 2 (two) times daily., Disp: , Rfl:    levothyroxine (SYNTHROID) 100 MCG tablet, Take 100 mcg by mouth daily before breakfast., Disp: , Rfl:    ondansetron (ZOFRAN) 8 MG tablet, Take 1 tablet (8 mg total) by mouth every 8 (eight) hours as needed for nausea or vomiting., Disp: 40 tablet, Rfl: 3   telmisartan (MICARDIS) 80 MG tablet, Take 80 mg by mouth daily., Disp: , Rfl:    amLODipine (NORVASC) 2.5 MG tablet, Take 2.5 mg by mouth daily. (Patient not taking: Reported on 08/01/2022), Disp: , Rfl:    chlorthalidone (HYGROTON) 25 MG  tablet, Take 25 mg by mouth daily. (Patient not taking: Reported on 08/01/2022), Disp: , Rfl:    naproxen sodium (ALEVE) 220 MG tablet, 2 tablets with food or milk as needed (Patient not taking: Reported on 08/01/2022), Disp: , Rfl:   Allergies:  Allergies  Allergen Reactions   Amlodipine Besylate Other (See Comments)   Amoxicillin-Pot Clavulanate Other (See Comments)    Past Medical History, Surgical history, Social history, and Family History were reviewed and updated.  Review of Systems: Review of Systems  Constitutional: Negative.   HENT:  Negative.    Eyes: Negative.   Respiratory: Negative.    Cardiovascular: Negative.   Gastrointestinal: Negative.   Endocrine: Negative.   Genitourinary: Negative.    Musculoskeletal: Negative.   Skin: Negative.   Neurological: Negative.   Hematological: Negative.   Psychiatric/Behavioral: Negative.      Physical Exam:  weight is 162 lb 12.8 oz (73.8 kg). Her oral temperature is 97.6 F (36.4 C). Her blood pressure is 171/48 (abnormal) and her pulse is 67. Her respiration is 18 and oxygen saturation is 100%.   Wt Readings from Last 3 Encounters:  08/01/22 162 lb 12.8 oz (73.8 kg)  04/19/22 160 lb (72.6 kg)  01/24/22  161 lb (73 kg)    Physical Exam Vitals reviewed.  HENT:     Head: Normocephalic and atraumatic.  Eyes:     Pupils: Pupils are equal, round, and reactive to light.  Cardiovascular:     Rate and Rhythm: Normal rate and regular rhythm.     Heart sounds: Normal heart sounds.  Pulmonary:     Effort: Pulmonary effort is normal.     Breath sounds: Normal breath sounds.  Abdominal:     General: Bowel sounds are normal.     Palpations: Abdomen is soft.  Musculoskeletal:        General: No tenderness or deformity. Normal range of motion.     Cervical back: Normal range of motion.  Lymphadenopathy:     Cervical: No cervical adenopathy.  Skin:    General: Skin is warm and dry.     Findings: No erythema or rash.   Neurological:     Mental Status: She is alert and oriented to person, place, and time.  Psychiatric:        Behavior: Behavior normal.        Thought Content: Thought content normal.        Judgment: Judgment normal.     Lab Results  Component Value Date   WBC 4.7 08/01/2022   HGB 10.7 (L) 08/01/2022   HCT 33.0 (L) 08/01/2022   MCV 97.9 08/01/2022   PLT 162 08/01/2022     Chemistry      Component Value Date/Time   NA 138 08/01/2022 1209   K 4.7 08/01/2022 1209   CL 105 08/01/2022 1209   CO2 27 08/01/2022 1209   BUN 26 (H) 08/01/2022 1209   CREATININE 1.19 (H) 08/01/2022 1209      Component Value Date/Time   CALCIUM 10.0 08/01/2022 1209   ALKPHOS 58 08/01/2022 1209   AST 22 08/01/2022 1209   ALT 16 08/01/2022 1209   BILITOT 0.7 08/01/2022 1209      Impression and Plan: Jodi Woods is a a very charming 81 year old white female.  She has chronic phase CML.  She has been in a major molecular remission.  We will have to see what the BCR/ABL level is now.  I think that we can probably still get her back in 6 months.  She can always come back to see [place] sooner if she has any problems.    Volanda Napoleon, MD 9/7/20231:10 PM

## 2022-08-05 DIAGNOSIS — H348312 Tributary (branch) retinal vein occlusion, right eye, stable: Secondary | ICD-10-CM | POA: Diagnosis not present

## 2022-08-05 DIAGNOSIS — H3582 Retinal ischemia: Secondary | ICD-10-CM | POA: Diagnosis not present

## 2022-08-05 DIAGNOSIS — H3563 Retinal hemorrhage, bilateral: Secondary | ICD-10-CM | POA: Diagnosis not present

## 2022-08-05 DIAGNOSIS — H3581 Retinal edema: Secondary | ICD-10-CM | POA: Diagnosis not present

## 2022-08-05 DIAGNOSIS — H3562 Retinal hemorrhage, left eye: Secondary | ICD-10-CM | POA: Diagnosis not present

## 2022-08-05 DIAGNOSIS — H3509 Other intraretinal microvascular abnormalities: Secondary | ICD-10-CM | POA: Diagnosis not present

## 2022-08-06 ENCOUNTER — Other Ambulatory Visit (HOSPITAL_COMMUNITY): Payer: Self-pay

## 2022-08-07 LAB — BCR/ABL

## 2022-08-08 ENCOUNTER — Telehealth: Payer: Self-pay | Admitting: *Deleted

## 2022-08-08 NOTE — Telephone Encounter (Signed)
-----   Message from Volanda Napoleon, MD sent at 08/07/2022  8:20 PM EDT ----- Call - the CML is still in remission.  Great job!!!  [employer]

## 2022-08-08 NOTE — Telephone Encounter (Signed)
Call placed to patient to notify her per order of Dr. Marin Olp that "the [facility] is still in remission. Great job!!! Film/video editor"  Pt is appreciative of call and has no questions or concerns at this time.

## 2022-08-15 DIAGNOSIS — I10 Essential (primary) hypertension: Secondary | ICD-10-CM | POA: Diagnosis not present

## 2022-08-15 DIAGNOSIS — E039 Hypothyroidism, unspecified: Secondary | ICD-10-CM | POA: Diagnosis not present

## 2022-08-15 DIAGNOSIS — E785 Hyperlipidemia, unspecified: Secondary | ICD-10-CM | POA: Diagnosis not present

## 2022-08-15 DIAGNOSIS — R768 Other specified abnormal immunological findings in serum: Secondary | ICD-10-CM | POA: Diagnosis not present

## 2022-08-15 DIAGNOSIS — R7989 Other specified abnormal findings of blood chemistry: Secondary | ICD-10-CM | POA: Diagnosis not present

## 2022-08-19 DIAGNOSIS — E039 Hypothyroidism, unspecified: Secondary | ICD-10-CM | POA: Diagnosis not present

## 2022-08-19 DIAGNOSIS — I10 Essential (primary) hypertension: Secondary | ICD-10-CM | POA: Diagnosis not present

## 2022-08-19 DIAGNOSIS — E782 Mixed hyperlipidemia: Secondary | ICD-10-CM | POA: Diagnosis not present

## 2022-08-19 DIAGNOSIS — Z23 Encounter for immunization: Secondary | ICD-10-CM | POA: Diagnosis not present

## 2022-08-19 DIAGNOSIS — E785 Hyperlipidemia, unspecified: Secondary | ICD-10-CM | POA: Diagnosis not present

## 2022-08-19 DIAGNOSIS — R7989 Other specified abnormal findings of blood chemistry: Secondary | ICD-10-CM | POA: Diagnosis not present

## 2022-08-19 DIAGNOSIS — H35042 Retinal micro-aneurysms, unspecified, left eye: Secondary | ICD-10-CM | POA: Diagnosis not present

## 2022-08-19 DIAGNOSIS — C9210 Chronic myeloid leukemia, BCR/ABL-positive, not having achieved remission: Secondary | ICD-10-CM | POA: Diagnosis not present

## 2022-08-19 DIAGNOSIS — K5900 Constipation, unspecified: Secondary | ICD-10-CM | POA: Diagnosis not present

## 2022-08-28 DIAGNOSIS — I10 Essential (primary) hypertension: Secondary | ICD-10-CM | POA: Diagnosis not present

## 2022-08-28 DIAGNOSIS — F331 Major depressive disorder, recurrent, moderate: Secondary | ICD-10-CM | POA: Diagnosis not present

## 2022-08-28 DIAGNOSIS — R768 Other specified abnormal immunological findings in serum: Secondary | ICD-10-CM | POA: Diagnosis not present

## 2022-08-28 DIAGNOSIS — G4700 Insomnia, unspecified: Secondary | ICD-10-CM | POA: Diagnosis not present

## 2022-08-28 DIAGNOSIS — D649 Anemia, unspecified: Secondary | ICD-10-CM | POA: Diagnosis not present

## 2022-08-28 DIAGNOSIS — F411 Generalized anxiety disorder: Secondary | ICD-10-CM | POA: Diagnosis not present

## 2022-08-28 DIAGNOSIS — E039 Hypothyroidism, unspecified: Secondary | ICD-10-CM | POA: Diagnosis not present

## 2022-08-30 ENCOUNTER — Other Ambulatory Visit (HOSPITAL_COMMUNITY): Payer: Self-pay

## 2022-09-03 DIAGNOSIS — M47812 Spondylosis without myelopathy or radiculopathy, cervical region: Secondary | ICD-10-CM | POA: Diagnosis not present

## 2022-09-04 ENCOUNTER — Other Ambulatory Visit (HOSPITAL_COMMUNITY): Payer: Self-pay

## 2022-09-05 DIAGNOSIS — H3582 Retinal ischemia: Secondary | ICD-10-CM | POA: Diagnosis not present

## 2022-09-05 DIAGNOSIS — H3562 Retinal hemorrhage, left eye: Secondary | ICD-10-CM | POA: Diagnosis not present

## 2022-09-05 DIAGNOSIS — H3509 Other intraretinal microvascular abnormalities: Secondary | ICD-10-CM | POA: Diagnosis not present

## 2022-09-05 DIAGNOSIS — H3581 Retinal edema: Secondary | ICD-10-CM | POA: Diagnosis not present

## 2022-09-10 DIAGNOSIS — M7912 Myalgia of auxiliary muscles, head and neck: Secondary | ICD-10-CM | POA: Diagnosis not present

## 2022-09-10 DIAGNOSIS — M47812 Spondylosis without myelopathy or radiculopathy, cervical region: Secondary | ICD-10-CM | POA: Diagnosis not present

## 2022-09-11 DIAGNOSIS — M25561 Pain in right knee: Secondary | ICD-10-CM | POA: Diagnosis not present

## 2022-09-16 DIAGNOSIS — F411 Generalized anxiety disorder: Secondary | ICD-10-CM | POA: Diagnosis not present

## 2022-09-16 DIAGNOSIS — M179 Osteoarthritis of knee, unspecified: Secondary | ICD-10-CM | POA: Diagnosis not present

## 2022-09-16 DIAGNOSIS — M329 Systemic lupus erythematosus, unspecified: Secondary | ICD-10-CM | POA: Diagnosis not present

## 2022-09-16 DIAGNOSIS — F329 Major depressive disorder, single episode, unspecified: Secondary | ICD-10-CM | POA: Diagnosis not present

## 2022-09-16 DIAGNOSIS — I10 Essential (primary) hypertension: Secondary | ICD-10-CM | POA: Diagnosis not present

## 2022-09-17 DIAGNOSIS — M25561 Pain in right knee: Secondary | ICD-10-CM | POA: Diagnosis not present

## 2022-09-19 DIAGNOSIS — M85852 Other specified disorders of bone density and structure, left thigh: Secondary | ICD-10-CM | POA: Diagnosis not present

## 2022-09-19 DIAGNOSIS — Z780 Asymptomatic menopausal state: Secondary | ICD-10-CM | POA: Diagnosis not present

## 2022-09-19 DIAGNOSIS — M85851 Other specified disorders of bone density and structure, right thigh: Secondary | ICD-10-CM | POA: Diagnosis not present

## 2022-09-23 DIAGNOSIS — M7912 Myalgia of auxiliary muscles, head and neck: Secondary | ICD-10-CM | POA: Diagnosis not present

## 2022-09-23 DIAGNOSIS — M6281 Muscle weakness (generalized): Secondary | ICD-10-CM | POA: Diagnosis not present

## 2022-09-23 DIAGNOSIS — M47812 Spondylosis without myelopathy or radiculopathy, cervical region: Secondary | ICD-10-CM | POA: Diagnosis not present

## 2022-09-24 ENCOUNTER — Other Ambulatory Visit (HOSPITAL_COMMUNITY): Payer: Self-pay

## 2022-09-24 ENCOUNTER — Other Ambulatory Visit: Payer: Self-pay | Admitting: Hematology & Oncology

## 2022-09-24 DIAGNOSIS — C9210 Chronic myeloid leukemia, BCR/ABL-positive, not having achieved remission: Secondary | ICD-10-CM

## 2022-09-24 MED ORDER — DASATINIB 50 MG PO TABS
ORAL_TABLET | ORAL | 8 refills | Status: DC
  Filled 2022-09-25: qty 15, 30d supply, fill #0
  Filled 2022-10-29 – 2022-10-30 (×2): qty 15, 30d supply, fill #1
  Filled 2022-11-21: qty 15, 30d supply, fill #2
  Filled 2022-12-18: qty 15, 30d supply, fill #3

## 2022-09-25 ENCOUNTER — Other Ambulatory Visit (HOSPITAL_COMMUNITY): Payer: Self-pay

## 2022-10-07 ENCOUNTER — Other Ambulatory Visit (HOSPITAL_COMMUNITY): Payer: Self-pay

## 2022-10-07 DIAGNOSIS — M47812 Spondylosis without myelopathy or radiculopathy, cervical region: Secondary | ICD-10-CM | POA: Diagnosis not present

## 2022-10-07 DIAGNOSIS — M6281 Muscle weakness (generalized): Secondary | ICD-10-CM | POA: Diagnosis not present

## 2022-10-07 DIAGNOSIS — M7912 Myalgia of auxiliary muscles, head and neck: Secondary | ICD-10-CM | POA: Diagnosis not present

## 2022-10-09 DIAGNOSIS — M47812 Spondylosis without myelopathy or radiculopathy, cervical region: Secondary | ICD-10-CM | POA: Diagnosis not present

## 2022-10-09 DIAGNOSIS — M7912 Myalgia of auxiliary muscles, head and neck: Secondary | ICD-10-CM | POA: Diagnosis not present

## 2022-10-09 DIAGNOSIS — M6281 Muscle weakness (generalized): Secondary | ICD-10-CM | POA: Diagnosis not present

## 2022-10-10 DIAGNOSIS — H3581 Retinal edema: Secondary | ICD-10-CM | POA: Diagnosis not present

## 2022-10-10 DIAGNOSIS — H3562 Retinal hemorrhage, left eye: Secondary | ICD-10-CM | POA: Diagnosis not present

## 2022-10-10 DIAGNOSIS — H35033 Hypertensive retinopathy, bilateral: Secondary | ICD-10-CM | POA: Diagnosis not present

## 2022-10-10 DIAGNOSIS — H348312 Tributary (branch) retinal vein occlusion, right eye, stable: Secondary | ICD-10-CM | POA: Diagnosis not present

## 2022-10-10 DIAGNOSIS — H43812 Vitreous degeneration, left eye: Secondary | ICD-10-CM | POA: Diagnosis not present

## 2022-10-10 DIAGNOSIS — H3509 Other intraretinal microvascular abnormalities: Secondary | ICD-10-CM | POA: Diagnosis not present

## 2022-10-15 DIAGNOSIS — M47812 Spondylosis without myelopathy or radiculopathy, cervical region: Secondary | ICD-10-CM | POA: Diagnosis not present

## 2022-10-15 DIAGNOSIS — M6281 Muscle weakness (generalized): Secondary | ICD-10-CM | POA: Diagnosis not present

## 2022-10-15 DIAGNOSIS — M7912 Myalgia of auxiliary muscles, head and neck: Secondary | ICD-10-CM | POA: Diagnosis not present

## 2022-10-16 DIAGNOSIS — E039 Hypothyroidism, unspecified: Secondary | ICD-10-CM | POA: Diagnosis not present

## 2022-10-16 DIAGNOSIS — E559 Vitamin D deficiency, unspecified: Secondary | ICD-10-CM | POA: Diagnosis not present

## 2022-10-21 DIAGNOSIS — E559 Vitamin D deficiency, unspecified: Secondary | ICD-10-CM | POA: Diagnosis not present

## 2022-10-21 DIAGNOSIS — M47812 Spondylosis without myelopathy or radiculopathy, cervical region: Secondary | ICD-10-CM | POA: Diagnosis not present

## 2022-10-21 DIAGNOSIS — G4700 Insomnia, unspecified: Secondary | ICD-10-CM | POA: Diagnosis not present

## 2022-10-21 DIAGNOSIS — F331 Major depressive disorder, recurrent, moderate: Secondary | ICD-10-CM | POA: Diagnosis not present

## 2022-10-21 DIAGNOSIS — M170 Bilateral primary osteoarthritis of knee: Secondary | ICD-10-CM | POA: Diagnosis not present

## 2022-10-21 DIAGNOSIS — F411 Generalized anxiety disorder: Secondary | ICD-10-CM | POA: Diagnosis not present

## 2022-10-21 DIAGNOSIS — M7912 Myalgia of auxiliary muscles, head and neck: Secondary | ICD-10-CM | POA: Diagnosis not present

## 2022-10-21 DIAGNOSIS — I10 Essential (primary) hypertension: Secondary | ICD-10-CM | POA: Diagnosis not present

## 2022-10-21 DIAGNOSIS — E039 Hypothyroidism, unspecified: Secondary | ICD-10-CM | POA: Diagnosis not present

## 2022-10-21 DIAGNOSIS — M8580 Other specified disorders of bone density and structure, unspecified site: Secondary | ICD-10-CM | POA: Diagnosis not present

## 2022-10-21 DIAGNOSIS — M6281 Muscle weakness (generalized): Secondary | ICD-10-CM | POA: Diagnosis not present

## 2022-10-21 DIAGNOSIS — Z23 Encounter for immunization: Secondary | ICD-10-CM | POA: Diagnosis not present

## 2022-10-23 DIAGNOSIS — M7912 Myalgia of auxiliary muscles, head and neck: Secondary | ICD-10-CM | POA: Diagnosis not present

## 2022-10-23 DIAGNOSIS — M47812 Spondylosis without myelopathy or radiculopathy, cervical region: Secondary | ICD-10-CM | POA: Diagnosis not present

## 2022-10-23 DIAGNOSIS — M6281 Muscle weakness (generalized): Secondary | ICD-10-CM | POA: Diagnosis not present

## 2022-10-25 ENCOUNTER — Other Ambulatory Visit (HOSPITAL_COMMUNITY): Payer: Self-pay

## 2022-10-28 DIAGNOSIS — M7912 Myalgia of auxiliary muscles, head and neck: Secondary | ICD-10-CM | POA: Diagnosis not present

## 2022-10-28 DIAGNOSIS — M6281 Muscle weakness (generalized): Secondary | ICD-10-CM | POA: Diagnosis not present

## 2022-10-28 DIAGNOSIS — M47812 Spondylosis without myelopathy or radiculopathy, cervical region: Secondary | ICD-10-CM | POA: Diagnosis not present

## 2022-10-29 ENCOUNTER — Other Ambulatory Visit (HOSPITAL_COMMUNITY): Payer: Self-pay

## 2022-10-29 DIAGNOSIS — E669 Obesity, unspecified: Secondary | ICD-10-CM | POA: Diagnosis not present

## 2022-10-29 DIAGNOSIS — Z6830 Body mass index (BMI) 30.0-30.9, adult: Secondary | ICD-10-CM | POA: Diagnosis not present

## 2022-10-29 DIAGNOSIS — R768 Other specified abnormal immunological findings in serum: Secondary | ICD-10-CM | POA: Diagnosis not present

## 2022-10-29 DIAGNOSIS — M2560 Stiffness of unspecified joint, not elsewhere classified: Secondary | ICD-10-CM | POA: Diagnosis not present

## 2022-10-29 DIAGNOSIS — M25561 Pain in right knee: Secondary | ICD-10-CM | POA: Diagnosis not present

## 2022-10-29 DIAGNOSIS — H539 Unspecified visual disturbance: Secondary | ICD-10-CM | POA: Diagnosis not present

## 2022-10-30 ENCOUNTER — Other Ambulatory Visit (HOSPITAL_COMMUNITY): Payer: Self-pay

## 2022-10-30 ENCOUNTER — Other Ambulatory Visit: Payer: Self-pay

## 2022-10-30 DIAGNOSIS — M7912 Myalgia of auxiliary muscles, head and neck: Secondary | ICD-10-CM | POA: Diagnosis not present

## 2022-10-30 DIAGNOSIS — M47812 Spondylosis without myelopathy or radiculopathy, cervical region: Secondary | ICD-10-CM | POA: Diagnosis not present

## 2022-10-30 DIAGNOSIS — M6281 Muscle weakness (generalized): Secondary | ICD-10-CM | POA: Diagnosis not present

## 2022-11-04 DIAGNOSIS — M47812 Spondylosis without myelopathy or radiculopathy, cervical region: Secondary | ICD-10-CM | POA: Diagnosis not present

## 2022-11-04 DIAGNOSIS — M7912 Myalgia of auxiliary muscles, head and neck: Secondary | ICD-10-CM | POA: Diagnosis not present

## 2022-11-04 DIAGNOSIS — M6281 Muscle weakness (generalized): Secondary | ICD-10-CM | POA: Diagnosis not present

## 2022-11-06 DIAGNOSIS — M47812 Spondylosis without myelopathy or radiculopathy, cervical region: Secondary | ICD-10-CM | POA: Diagnosis not present

## 2022-11-06 DIAGNOSIS — M7912 Myalgia of auxiliary muscles, head and neck: Secondary | ICD-10-CM | POA: Diagnosis not present

## 2022-11-06 DIAGNOSIS — M6281 Muscle weakness (generalized): Secondary | ICD-10-CM | POA: Diagnosis not present

## 2022-11-13 DIAGNOSIS — U071 COVID-19: Secondary | ICD-10-CM | POA: Diagnosis not present

## 2022-11-13 DIAGNOSIS — Z1159 Encounter for screening for other viral diseases: Secondary | ICD-10-CM | POA: Diagnosis not present

## 2022-11-13 DIAGNOSIS — J069 Acute upper respiratory infection, unspecified: Secondary | ICD-10-CM | POA: Diagnosis not present

## 2022-11-13 DIAGNOSIS — I10 Essential (primary) hypertension: Secondary | ICD-10-CM | POA: Diagnosis not present

## 2022-11-13 DIAGNOSIS — K219 Gastro-esophageal reflux disease without esophagitis: Secondary | ICD-10-CM | POA: Diagnosis not present

## 2022-11-15 ENCOUNTER — Ambulatory Visit: Payer: BC Managed Care – PPO | Admitting: Obstetrics and Gynecology

## 2022-11-21 ENCOUNTER — Other Ambulatory Visit (HOSPITAL_COMMUNITY): Payer: Self-pay

## 2022-11-26 ENCOUNTER — Other Ambulatory Visit: Payer: Self-pay

## 2022-11-26 ENCOUNTER — Other Ambulatory Visit (HOSPITAL_COMMUNITY): Payer: Self-pay

## 2022-11-28 DIAGNOSIS — M25561 Pain in right knee: Secondary | ICD-10-CM | POA: Diagnosis not present

## 2022-12-03 DIAGNOSIS — M179 Osteoarthritis of knee, unspecified: Secondary | ICD-10-CM | POA: Diagnosis not present

## 2022-12-03 DIAGNOSIS — E039 Hypothyroidism, unspecified: Secondary | ICD-10-CM | POA: Diagnosis not present

## 2022-12-03 DIAGNOSIS — E785 Hyperlipidemia, unspecified: Secondary | ICD-10-CM | POA: Diagnosis not present

## 2022-12-03 DIAGNOSIS — I10 Essential (primary) hypertension: Secondary | ICD-10-CM | POA: Diagnosis not present

## 2022-12-06 ENCOUNTER — Other Ambulatory Visit (HOSPITAL_COMMUNITY): Payer: Self-pay

## 2022-12-16 DIAGNOSIS — H3562 Retinal hemorrhage, left eye: Secondary | ICD-10-CM | POA: Diagnosis not present

## 2022-12-16 DIAGNOSIS — H3581 Retinal edema: Secondary | ICD-10-CM | POA: Diagnosis not present

## 2022-12-16 DIAGNOSIS — H3509 Other intraretinal microvascular abnormalities: Secondary | ICD-10-CM | POA: Diagnosis not present

## 2022-12-18 ENCOUNTER — Other Ambulatory Visit (HOSPITAL_COMMUNITY): Payer: Self-pay

## 2022-12-24 ENCOUNTER — Other Ambulatory Visit: Payer: Self-pay

## 2022-12-25 ENCOUNTER — Other Ambulatory Visit (HOSPITAL_COMMUNITY): Payer: Self-pay

## 2022-12-26 DIAGNOSIS — E039 Hypothyroidism, unspecified: Secondary | ICD-10-CM | POA: Diagnosis not present

## 2022-12-26 DIAGNOSIS — I10 Essential (primary) hypertension: Secondary | ICD-10-CM | POA: Diagnosis not present

## 2022-12-26 DIAGNOSIS — E559 Vitamin D deficiency, unspecified: Secondary | ICD-10-CM | POA: Diagnosis not present

## 2022-12-30 DIAGNOSIS — U099 Post covid-19 condition, unspecified: Secondary | ICD-10-CM | POA: Diagnosis not present

## 2022-12-30 DIAGNOSIS — E538 Deficiency of other specified B group vitamins: Secondary | ICD-10-CM | POA: Diagnosis not present

## 2022-12-30 DIAGNOSIS — M542 Cervicalgia: Secondary | ICD-10-CM | POA: Diagnosis not present

## 2022-12-30 DIAGNOSIS — I8390 Asymptomatic varicose veins of unspecified lower extremity: Secondary | ICD-10-CM | POA: Diagnosis not present

## 2022-12-30 DIAGNOSIS — I10 Essential (primary) hypertension: Secondary | ICD-10-CM | POA: Diagnosis not present

## 2022-12-30 DIAGNOSIS — E039 Hypothyroidism, unspecified: Secondary | ICD-10-CM | POA: Diagnosis not present

## 2022-12-30 DIAGNOSIS — E559 Vitamin D deficiency, unspecified: Secondary | ICD-10-CM | POA: Diagnosis not present

## 2023-01-01 NOTE — Progress Notes (Signed)
Sent message, via epic in basket, requesting orders in epic from surgeon.  

## 2023-01-03 NOTE — Progress Notes (Signed)
Second request for pre op orders in [facility]: Left a voicemail for [employer].

## 2023-01-06 NOTE — Progress Notes (Signed)
Anesthesia Review:  PCP: Cardiologist : Chest x-ray : EKG : Echo : 2021  Stress test: Cardiac Cath :  Activity level:  Sleep Study/ CPAP : Fasting Blood Sugar :      / Checks Blood Sugar -- times a day:   Blood Thinner/ Instructions /Last Dose: ASA / Instructions/ Last Dose :

## 2023-01-07 DIAGNOSIS — M25561 Pain in right knee: Secondary | ICD-10-CM | POA: Diagnosis not present

## 2023-01-07 DIAGNOSIS — E039 Hypothyroidism, unspecified: Secondary | ICD-10-CM | POA: Diagnosis not present

## 2023-01-07 DIAGNOSIS — I10 Essential (primary) hypertension: Secondary | ICD-10-CM | POA: Diagnosis not present

## 2023-01-07 DIAGNOSIS — M25552 Pain in left hip: Secondary | ICD-10-CM | POA: Diagnosis not present

## 2023-01-07 NOTE — Patient Instructions (Addendum)
SURGICAL WAITING ROOM VISITATION  Patients having surgery or a procedure may have no more than 2 support people in the waiting area - these visitors may rotate.    Children under the age of 7 must have an adult with them who is not the patient.  Due to an increase in RSV and influenza rates and associated hospitalizations, children ages 81 and under may not visit patients in [place].  If the patient needs to stay at the hospital during part of their recovery, the visitor guidelines for inpatient rooms apply. Pre-op nurse will coordinate an appropriate time for 1 support person to accompany patient in pre-op.  This support person may not rotate.    Please refer to the [facility] website for the visitor guidelines for Inpatients (after your surgery is over and you are in a regular room).       Your procedure is scheduled on:  01/20/23    Report to [facility] Main Entrance    Report to admitting at  1045 AM   Call this number if you have problems the morning of surgery [phone]   Do not eat food :After Midnight.   After Midnight you may have the following liquids until ______ AM/ PM DAY OF SURGERY  Water Non-Citrus Juices (without pulp, NO RED-Apple, White grape, White cranberry) Black Coffee (NO MILK/CREAM OR CREAMERS, sugar ok)  Clear Tea (NO MILK/CREAM OR CREAMERS, sugar ok) regular and decaf                             Plain Jell-O (NO RED)                                           Fruit ices (not with fruit pulp, NO RED)                                     Popsicles (NO RED)                                                               Sports drinks like Gatorade (NO RED)              Drink 2 Ensure/G2 drinks AT 10:00 PM the night before surgery.        The day of surgery:  Drink ONE (1) Pre-Surgery Clear Ensure or G2 at AM the morning of surgery. Drink in one sitting. Do not sip.  This drink was given to you during your hospital  pre-op  appointment visit. Nothing else to drink after completing the  Pre-Surgery Clear Ensure or G2.          If you have questions, please contact your surgeon's office.   FOLLOW BOWEL PREP AND ANY ADDITIONAL PRE OP INSTRUCTIONS YOU RECEIVED FROM YOUR SURGEON'S OFFICE!!!     Oral Hygiene is also important to reduce your risk of infection.  Remember - BRUSH YOUR TEETH THE MORNING OF SURGERY WITH YOUR REGULAR TOOTHPASTE  DENTURES WILL BE REMOVED PRIOR TO SURGERY PLEASE DO NOT APPLY "Poly grip" OR ADHESIVES!!!   Do NOT smoke after Midnight   Take these medicines the morning of surgery with A SIP OF WATER:   DO NOT TAKE ANY ORAL DIABETIC MEDICATIONS DAY OF YOUR SURGERY  Bring CPAP mask and tubing day of surgery.                              You may not have any metal on your body including hair pins, jewelry, and body piercing             Do not wear make-up, lotions, powders, perfumes/cologne, or deodorant  Do not wear nail polish including gel and S&S, artificial/acrylic nails, or any other type of covering on natural nails including finger and toenails. If you have artificial nails, gel coating, etc. that needs to be removed by a nail salon please have this removed prior to surgery or surgery may need to be canceled/ delayed if the surgeon/ anesthesia feels like they are unable to be safely monitored.   Do not shave  48 hours prior to surgery.               Men may shave face and neck.   Do not bring valuables to the hospital. [place].   Contacts, glasses, dentures or bridgework may not be worn into surgery.   Bring small overnight bag day of surgery.   DO NOT [place]. PHARMACY WILL DISPENSE MEDICATIONS LISTED ON YOUR MEDICATION LIST TO YOU DURING YOUR ADMISSION [place]!    Patients discharged on the day of surgery will not be allowed to drive home.   Someone NEEDS to stay with you for the first 24 hours after anesthesia.   Special Instructions: Bring a copy of your healthcare power of attorney and living will documents the day of surgery if you haven't scanned them before.              Please read over the following fact sheets you were given: IF [place] [phone]   If you received a COVID test during your pre-op visit  it is requested that you wear a mask when out in public, stay away from anyone that may not be feeling well and notify your surgeon if you develop symptoms. If you test positive for Covid or have been in contact with anyone that has tested positive in the last 10 days please notify you surgeon.    [place] - Preparing for Surgery Before surgery, you can play an important role.  Because skin is not sterile, your skin needs to be as free of germs as possible.  You can reduce the number of germs on your skin by washing with CHG (chlorahexidine gluconate) soap before surgery.  CHG is an antiseptic cleaner which kills germs and bonds with the skin to continue killing germs even after washing. Please DO NOT use if you have an allergy to CHG or antibacterial soaps.  If your skin becomes reddened/irritated stop using the CHG and inform your nurse when you arrive at Short Stay. Do not shave (including legs and underarms) for at least  48 hours prior to the first CHG shower.  You may shave your face/neck. Please follow these instructions carefully:  1.  Shower with CHG Soap the night before surgery and the  morning of Surgery.  2.  If you choose to wash your hair, wash your hair first as usual with your  normal  shampoo.  3.  After you shampoo, rinse your hair and body thoroughly to remove the  shampoo.                           4.  Use CHG as you would any other liquid soap.  You can apply chg directly  to the skin and wash                       Gently with a scrungie or clean  washcloth.  5.  Apply the CHG Soap to your body ONLY FROM THE NECK DOWN.   Do not use on face/ open                           Wound or open sores. Avoid contact with eyes, ears mouth and genitals (private parts).                       Wash face,  Genitals (private parts) with your normal soap.             6.  Wash thoroughly, paying special attention to the area where your surgery  will be performed.  7.  Thoroughly rinse your body with warm water from the neck down.  8.  DO NOT shower/wash with your normal soap after using and rinsing off  the CHG Soap.                9.  Pat yourself dry with a clean towel.            10.  Wear clean pajamas.            11.  Place clean sheets on your bed the night of your first shower and do not  sleep with pets. Day of Surgery : Do not apply any lotions/deodorants the morning of surgery.  Please wear clean clothes to the hospital/surgery center.  FAILURE TO FOLLOW THESE INSTRUCTIONS MAY RESULT IN THE CANCELLATION OF YOUR SURGERY PATIENT SIGNATURE_________________________________  NURSE SIGNATURE__________________________________  ________________________________________________________________________

## 2023-01-08 ENCOUNTER — Other Ambulatory Visit: Payer: Self-pay

## 2023-01-08 ENCOUNTER — Ambulatory Visit: Payer: Self-pay | Admitting: Physician Assistant

## 2023-01-08 ENCOUNTER — Encounter (HOSPITAL_COMMUNITY)
Admission: RE | Admit: 2023-01-08 | Discharge: 2023-01-08 | Disposition: A | Discharge status: Home / Self Care | Payer: Medicare HMO | Source: Ambulatory Visit | Attending: Orthopedic Surgery | Admitting: Orthopedic Surgery

## 2023-01-08 ENCOUNTER — Encounter (HOSPITAL_COMMUNITY): Payer: Self-pay

## 2023-01-08 VITALS — BP 154/61 | HR 68 | Temp 98.5°F | Resp 16 | Ht 61.5 in | Wt 157.0 lb

## 2023-01-08 DIAGNOSIS — I4519 Other right bundle-branch block: Secondary | ICD-10-CM | POA: Diagnosis not present

## 2023-01-08 DIAGNOSIS — Z01818 Encounter for other preprocedural examination: Secondary | ICD-10-CM | POA: Diagnosis not present

## 2023-01-08 DIAGNOSIS — G8929 Other chronic pain: Secondary | ICD-10-CM

## 2023-01-08 DIAGNOSIS — I517 Cardiomegaly: Secondary | ICD-10-CM | POA: Insufficient documentation

## 2023-01-08 DIAGNOSIS — M25561 Pain in right knee: Secondary | ICD-10-CM | POA: Insufficient documentation

## 2023-01-08 HISTORY — DX: Aneurysm of unspecified site: I72.9

## 2023-01-08 HISTORY — DX: Unspecified osteoarthritis, unspecified site: M19.90

## 2023-01-08 HISTORY — DX: Pneumonia, unspecified organism: J18.9

## 2023-01-08 HISTORY — DX: Cerebral infarction, unspecified: I63.9

## 2023-01-08 HISTORY — DX: Hypothyroidism, unspecified: E03.9

## 2023-01-08 LAB — SURGICAL PCR SCREEN
MRSA, PCR: NEGATIVE
Staphylococcus aureus: NEGATIVE

## 2023-01-08 LAB — COMPREHENSIVE METABOLIC PANEL
ALT: 17 U/L (ref 0–44)
AST: 24 U/L (ref 15–41)
Albumin: 3.9 g/dL (ref 3.5–5.0)
Alkaline Phosphatase: 49 U/L (ref 38–126)
Anion gap: 9 (ref 5–15)
BUN: 33 mg/dL — ABNORMAL HIGH (ref 8–23)
CO2: 25 mmol/L (ref 22–32)
Calcium: 9.3 mg/dL (ref 8.9–10.3)
Chloride: 103 mmol/L (ref 98–111)
Creatinine, Ser: 1.29 mg/dL — ABNORMAL HIGH (ref 0.44–1.00)
GFR, Estimated: 41 mL/min — ABNORMAL LOW (ref >60)
Glucose, Bld: 110 mg/dL — ABNORMAL HIGH (ref 70–99)
Potassium: 4.3 mmol/L (ref 3.5–5.1)
Sodium: 137 mmol/L (ref 135–145)
Total Bilirubin: 0.9 mg/dL (ref 0.3–1.2)
Total Protein: 6.6 g/dL (ref 6.5–8.1)

## 2023-01-08 LAB — CBC WITH DIFFERENTIAL/PLATELET
Abs Immature Granulocytes: 0.02 10*3/uL (ref 0.00–0.07)
Basophils Absolute: 0 10*3/uL (ref 0.0–0.1)
Basophils Relative: 0 %
Eosinophils Absolute: 0.2 10*3/uL (ref 0.0–0.5)
Eosinophils Relative: 3 %
HCT: 34.2 % — ABNORMAL LOW (ref 36.0–46.0)
Hemoglobin: 11.2 g/dL — ABNORMAL LOW (ref 12.0–15.0)
Immature Granulocytes: 0 %
Lymphocytes Relative: 33 %
Lymphs Abs: 1.8 10*3/uL (ref 0.7–4.0)
MCH: 31.8 pg (ref 26.0–34.0)
MCHC: 32.7 g/dL (ref 30.0–36.0)
MCV: 97.2 fL (ref 80.0–100.0)
Monocytes Absolute: 0.5 10*3/uL (ref 0.1–1.0)
Monocytes Relative: 9 %
Neutro Abs: 2.9 10*3/uL (ref 1.7–7.7)
Neutrophils Relative %: 55 %
Platelets: 163 10*3/uL (ref 150–400)
RBC: 3.52 MIL/uL — ABNORMAL LOW (ref 3.87–5.11)
RDW: 12.5 % (ref 11.5–15.5)
WBC: 5.3 10*3/uL (ref 4.0–10.5)
nRBC: 0 % (ref 0.0–0.2)

## 2023-01-08 LAB — TYPE AND SCREEN
ABO/RH(D): O POS
Antibody Screen: NEGATIVE

## 2023-01-08 NOTE — H&P (Signed)
TOTAL KNEE ADMISSION H&P  Patient is being admitted for right total knee arthroplasty.  Subjective:  Chief Complaint:right knee pain.  HPI: Jodi Woods, 82 y.o. female, has a history of pain and functional disability in the right knee due to arthritis and has failed non-surgical conservative treatments for greater than 12 weeks to includeNSAID's and/or analgesics, corticosteriod injections, use of assistive devices, and activity modification.  Onset of symptoms was gradual, starting 6 years ago with gradually worsening course since that time. The patient noted no past surgery on the right knee(s).  Patient currently rates pain in the right knee(s) at 8 out of 10 with activity. Patient has night pain, worsening of pain with activity and weight bearing, pain that interferes with activities of daily living, pain with passive range of motion, crepitus, and joint swelling.  Patient has evidence of periarticular osteophytes and joint space narrowing by imaging studies.  There is no active infection.  Patient Active Problem List   Diagnosis Date Noted  . CML (chronic myelocytic leukemia) ([place]) 02/07/2020  . Iron deficiency anemia due to chronic blood loss 02/07/2020  . 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 CML (chronic myelocytic leukemia) ([place]) 02/07/2020  . High cholesterol   . Hypertension   . Iron deficiency anemia due to chronic blood loss 02/07/2020  . Thyroid disease     No past surgical history on file.  Current Outpatient Medications  Medication Sig Dispense Refill Last Dose  . acetaminophen (TYLENOL) 650 MG CR tablet Take 1,300 mg by mouth every 8 (eight) hours as needed for pain.     Marland Kitchen aspirin EC 81 MG tablet Take 81 mg by mouth daily.     Marland Kitchen atorvastatin (LIPITOR) 20 MG tablet Take 20 mg by mouth daily.     . Cholecalciferol (VITAMIN D) 125 MCG (5000 UT) CAPS Take 5,000 Units by mouth daily.     .  Cyanocobalamin (VITAMIN B12) 1000 MCG TBCR Take 1,000 mcg by mouth 3 (three) times a week.     . dasatinib (SPRYCEL) 50 MG tablet TAKE 1 TABLET (50 MG TOTAL) BY MOUTH EVERY OTHER DAY. 15 tablet 8   . diclofenac Sodium (VOLTAREN) 1 % GEL Apply 2 g topically daily as needed (knee pain).     . famotidine (PEPCID) 20 MG tablet Take 20 mg by mouth 2 (two) times daily.     Marland Kitchen levothyroxine (SYNTHROID) 125 MCG tablet Take 125 mcg by mouth daily before breakfast.     . Multiple Vitamin (MULTIVITAMIN WITH MINERALS) TABS tablet Take 1 tablet by mouth daily.     . ondansetron (ZOFRAN) 8 MG tablet Take 1 tablet (8 mg total) by mouth every 8 (eight) hours as needed for nausea or vomiting. 40 tablet 3   . polyethylene glycol (MIRALAX / GLYCOLAX) 17 g packet Take 17 g by mouth daily as needed for moderate constipation.     Marland Kitchen telmisartan-hydrochlorothiazide (MICARDIS HCT) 80-25 MG tablet Take 1 tablet by mouth daily.      No current facility-administered medications for this visit.   Allergies  Allergen Reactions  . Amlodipine Besylate Swelling    Swelling in legs  . Amoxicillin-Pot Clavulanate Diarrhea    Social History   Tobacco Use  . Smoking status: Never  . Smokeless tobacco: Never  Substance Use Topics  . Alcohol use: Yes    Comment: rare    No family history on file.   Review of  Systems  HENT:  Positive for hearing loss and tinnitus.   Respiratory:  Positive for shortness of breath.   Cardiovascular:  Positive for leg swelling.  Genitourinary:  Positive for frequency.  Musculoskeletal:  Positive for arthralgias.  Neurological:  Positive for headaches.  All other systems reviewed and are negative.  Objective:  Physical Exam Constitutional:      General: She is not in acute distress.    Appearance: Normal appearance.  HENT:     Head: Normocephalic and atraumatic.  Eyes:     Extraocular Movements: Extraocular movements intact.     Pupils: Pupils are equal, round, and reactive to  light.  Cardiovascular:     Rate and Rhythm: Normal rate and regular rhythm.     Pulses: Normal pulses.     Heart sounds: Normal heart sounds.  Pulmonary:     Effort: Pulmonary effort is normal. No respiratory distress.     Breath sounds: Normal breath sounds. No wheezing.  Abdominal:     General: Abdomen is flat. Bowel sounds are normal. There is no distension.     Palpations: Abdomen is soft.     Tenderness: There is no abdominal tenderness.  Musculoskeletal:     Cervical back: Normal range of motion and neck supple.     Right knee: Swelling, bony tenderness and crepitus present. No effusion or erythema. Decreased range of motion. Tenderness present.  Lymphadenopathy:     Cervical: No cervical adenopathy.  Skin:    General: Skin is warm and dry.     Findings: No erythema or rash.  Neurological:     General: No focal deficit present.     Mental Status: She is alert and oriented to person, place, and time.  Psychiatric:        Mood and Affect: Mood normal.        Behavior: Behavior normal.   Vital signs in last 24 hours: @VSRANGES$ @  Labs:   Estimated body mass index is 30.76 kg/m as calculated from the following:   Height as of 04/19/22: 5' 1"$  (1.549 m).   Weight as of 08/01/22: 73.8 kg.   Imaging Review Plain radiographs demonstrate moderate degenerative joint disease of the right knee(s). The overall alignment ismild valgus. The bone quality appears to be good for age and reported activity level.      Assessment/Plan:  End stage arthritis, right knee   The patient history, physical examination, clinical judgment of the provider and imaging studies are consistent with end stage degenerative joint disease of the right knee(s) and total knee arthroplasty is deemed medically necessary. The treatment options including medical management, injection therapy arthroscopy and arthroplasty were discussed at length. The risks and benefits of total knee arthroplasty were presented  and reviewed. The risks due to aseptic loosening, infection, stiffness, patella tracking problems, thromboembolic complications and other imponderables were discussed. The patient acknowledged the explanation, agreed to proceed with the plan and consent was signed. Patient is being admitted for inpatient treatment for surgery, pain control, PT, OT, prophylactic antibiotics, VTE prophylaxis, progressive ambulation and ADL's and discharge planning. The patient is planning to be discharged home with home health services     Patient's anticipated LOS is less than 2 midnights, meeting these requirements: - Younger than 67 - Lives within 1 hour of care - Has a competent adult at home to recover with post-op recover - NO history of  - Chronic pain requiring opiods  - Diabetes  - Coronary Artery Disease  - Heart  failure  - Heart attack  - Stroke  - DVT/VTE  - Cardiac arrhythmia  - Respiratory Failure/COPD  - Renal failure  - Anemia  - Advanced Liver disease

## 2023-01-08 NOTE — H&P (View-Only) (Signed)
TOTAL KNEE ADMISSION H&P  Patient is being admitted for right total knee arthroplasty.  Subjective:  Chief Complaint:right knee pain.  HPI: Jodi Woods, 82 y.o. female, has a history of pain and functional disability in the right knee due to arthritis and has failed non-surgical conservative treatments for greater than 12 weeks to includeNSAID's and/or analgesics, corticosteriod injections, use of assistive devices, and activity modification.  Onset of symptoms was gradual, starting 6 years ago with gradually worsening course since that time. The patient noted no past surgery on the right knee(s).  Patient currently rates pain in the right knee(s) at 8 out of 10 with activity. Patient has night pain, worsening of pain with activity and weight bearing, pain that interferes with activities of daily living, pain with passive range of motion, crepitus, and joint swelling.  Patient has evidence of periarticular osteophytes and joint space narrowing by imaging studies.  There is no active infection.  Patient Active Problem List   Diagnosis Date Noted  . 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 Iron deficiency anemia due to chronic blood loss 02/07/2020  . 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 High cholesterol   . Hypertension   . Iron deficiency anemia due to chronic blood loss 02/07/2020  . Thyroid disease     No past surgical history on file.  Current Outpatient Medications  Medication Sig Dispense Refill Last Dose  . acetaminophen (TYLENOL) 650 MG CR tablet Take 1,300 mg by mouth every 8 (eight) hours as needed for pain.     Marland Kitchen aspirin EC 81 MG tablet Take 81 mg by mouth daily.     Marland Kitchen atorvastatin (LIPITOR) 20 MG tablet Take 20 mg by mouth daily.     . Cholecalciferol (VITAMIN D) 125 MCG (5000 UT) CAPS Take 5,000 Units by mouth daily.     .  Cyanocobalamin (VITAMIN B12) 1000 MCG TBCR Take 1,000 mcg by mouth 3 (three) times a week.     . dasatinib (SPRYCEL) 50 MG tablet TAKE 1 TABLET (50 MG TOTAL) BY MOUTH EVERY OTHER DAY. 15 tablet 8   . diclofenac Sodium (VOLTAREN) 1 % GEL Apply 2 g topically daily as needed (knee pain).     . famotidine (PEPCID) 20 MG tablet Take 20 mg by mouth 2 (two) times daily.     Marland Kitchen levothyroxine (SYNTHROID) 125 MCG tablet Take 125 mcg by mouth daily before breakfast.     . Multiple Vitamin (MULTIVITAMIN WITH MINERALS) TABS tablet Take 1 tablet by mouth daily.     . ondansetron (ZOFRAN) 8 MG tablet Take 1 tablet (8 mg total) by mouth every 8 (eight) hours as needed for nausea or vomiting. 40 tablet 3   . polyethylene glycol (MIRALAX / GLYCOLAX) 17 g packet Take 17 g by mouth daily as needed for moderate constipation.     Marland Kitchen telmisartan-hydrochlorothiazide (MICARDIS HCT) 80-25 MG tablet Take 1 tablet by mouth daily.      No current facility-administered medications for this visit.   Allergies  Allergen Reactions  . Amlodipine Besylate Swelling    Swelling in legs  . Amoxicillin-Pot Clavulanate Diarrhea    Social History   Tobacco Use  . Smoking status: Never  . Smokeless tobacco: Never  Substance Use Topics  . Alcohol use: Yes    Comment: rare    No family history on file.   Review of  Systems  HENT:  Positive for hearing loss and tinnitus.   Respiratory:  Positive for shortness of breath.   Cardiovascular:  Positive for leg swelling.  Genitourinary:  Positive for frequency.  Musculoskeletal:  Positive for arthralgias.  Neurological:  Positive for headaches.  All other systems reviewed and are negative.  Objective:  Physical Exam Constitutional:      General: She is not in acute distress.    Appearance: Normal appearance.  HENT:     Head: Normocephalic and atraumatic.  Eyes:     Extraocular Movements: Extraocular movements intact.     Pupils: Pupils are equal, round, and reactive to  light.  Cardiovascular:     Rate and Rhythm: Normal rate and regular rhythm.     Pulses: Normal pulses.     Heart sounds: Normal heart sounds.  Pulmonary:     Effort: Pulmonary effort is normal. No respiratory distress.     Breath sounds: Normal breath sounds. No wheezing.  Abdominal:     General: Abdomen is flat. Bowel sounds are normal. There is no distension.     Palpations: Abdomen is soft.     Tenderness: There is no abdominal tenderness.  Musculoskeletal:     Cervical back: Normal range of motion and neck supple.     Right knee: Swelling, bony tenderness and crepitus present. No effusion or erythema. Decreased range of motion. Tenderness present.  Lymphadenopathy:     Cervical: No cervical adenopathy.  Skin:    General: Skin is warm and dry.     Findings: No erythema or rash.  Neurological:     General: No focal deficit present.     Mental Status: She is alert and oriented to person, place, and time.  Psychiatric:        Mood and Affect: Mood normal.        Behavior: Behavior normal.   Vital signs in last 24 hours: '@VSRANGES'$ @  Labs:   Estimated body mass index is 30.76 kg/m as calculated from the following:   Height as of 04/19/22: '5\' 1"'$  (1.549 m).   Weight as of 08/01/22: 73.8 kg.   Imaging Review Plain radiographs demonstrate moderate degenerative joint disease of the right knee(s). The overall alignment ismild valgus. The bone quality appears to be good for age and reported activity level.      Assessment/Plan:  End stage arthritis, right knee   The patient history, physical examination, clinical judgment of the provider and imaging studies are consistent with end stage degenerative joint disease of the right knee(s) and total knee arthroplasty is deemed medically necessary. The treatment options including medical management, injection therapy arthroscopy and arthroplasty were discussed at length. The risks and benefits of total knee arthroplasty were presented  and reviewed. The risks due to aseptic loosening, infection, stiffness, patella tracking problems, thromboembolic complications and other imponderables were discussed. The patient acknowledged the explanation, agreed to proceed with the plan and consent was signed. Patient is being admitted for inpatient treatment for surgery, pain control, PT, OT, prophylactic antibiotics, VTE prophylaxis, progressive ambulation and ADL's and discharge planning. The patient is planning to be discharged home with home health services     Patient's anticipated LOS is less than 2 midnights, meeting these requirements: - Younger than 38 - Lives within 1 hour of care - Has a competent adult at home to recover with post-op recover - NO history of  - Chronic pain requiring opiods  - Diabetes  - Coronary Artery Disease  - Heart  failure  - Heart attack  - Stroke  - DVT/VTE  - Cardiac arrhythmia  - Respiratory Failure/COPD  - Renal failure  - Anemia  - Advanced Liver disease

## 2023-01-10 ENCOUNTER — Other Ambulatory Visit (HOSPITAL_COMMUNITY): Payer: Self-pay

## 2023-01-13 ENCOUNTER — Telehealth: Payer: Self-pay

## 2023-01-13 ENCOUNTER — Other Ambulatory Visit (HOSPITAL_COMMUNITY): Payer: Self-pay

## 2023-01-13 DIAGNOSIS — M542 Cervicalgia: Secondary | ICD-10-CM | POA: Diagnosis not present

## 2023-01-13 DIAGNOSIS — I10 Essential (primary) hypertension: Secondary | ICD-10-CM | POA: Diagnosis not present

## 2023-01-13 DIAGNOSIS — M1712 Unilateral primary osteoarthritis, left knee: Secondary | ICD-10-CM | POA: Diagnosis not present

## 2023-01-13 DIAGNOSIS — M1711 Unilateral primary osteoarthritis, right knee: Secondary | ICD-10-CM | POA: Diagnosis not present

## 2023-01-13 DIAGNOSIS — N3941 Urge incontinence: Secondary | ICD-10-CM | POA: Diagnosis not present

## 2023-01-13 NOTE — Telephone Encounter (Signed)
Oral Oncology Patient Advocate Encounter  Re-authorization   Received notification from patient's insurance company via fax that prior authorization for Sprycel is due for renewal.   PA submitted on 01/13/23  Key [id number]  Status is pending     Berdine Addison, [employer] Patient [place]  [phone] (phone) [phone] (fax) 01/13/2023 9:16 AM

## 2023-01-13 NOTE — Telephone Encounter (Signed)
Oral Oncology Patient Advocate Encounter  Re-authorization   Received notification via patient's insurance that prior authorization for Sprycel is due for renewal.   PA submitted on 01/13/23  Key [id number]  Status is pending     Berdine Addison, [place] Patient [place]  [phone] (phone) [phone] (fax) 01/13/2023 9:34 AM

## 2023-01-13 NOTE — Telephone Encounter (Signed)
Oral Oncology Patient Advocate Encounter  Prior Authorization for Sprycel has been approved via [facility].    PA# [id number]  Effective dates: 01/13/23 through 01/14/24.    Berdine Addison, [facility] Patient [place]  [phone] (phone) [phone] (fax) 01/13/2023 9:35 AM

## 2023-01-13 NOTE — Telephone Encounter (Signed)
Oral Oncology Patient Advocate Encounter  Prior Authorization for Sprycel has been approved via [employer].    PA# [id number]  Effective dates: 11/25/22 through 11/25/23.    Berdine Addison, [facility] Patient [place]  [phone] (phone) [phone] (fax) 01/13/2023 9:32 AM

## 2023-01-15 NOTE — Care Plan (Signed)
Ortho Bundle Case Management Note  Patient Details  Name: Jodi Woods MRN: [id number] Date of Birth: 03-27-1941   met with patient in the office for H&P she will discharge to home with family to assist. has all needed DME at home. HHPT referral to [place] [facility] [place] set up with [facility]. discharge instructions discussed and questions answered. appointments confirmed. Patient and MD in agreement. Choice offered                   DME Arranged:  CPM DME Agency:  [facility]  HH Arranged:  PT HH Agency:  [facility] [facility]  Additional Comments: Please contact me with any questions of if this plan should need to change.  Ladell Heads,  [facility]  [phone] 01/15/2023, 3:50 PM

## 2023-01-16 ENCOUNTER — Other Ambulatory Visit (HOSPITAL_COMMUNITY): Payer: Self-pay

## 2023-01-17 ENCOUNTER — Inpatient Hospital Stay: Payer: Medicare HMO | Attending: Hematology & Oncology

## 2023-01-17 ENCOUNTER — Telehealth: Payer: Self-pay | Admitting: *Deleted

## 2023-01-17 ENCOUNTER — Inpatient Hospital Stay (HOSPITAL_BASED_OUTPATIENT_CLINIC_OR_DEPARTMENT_OTHER): Payer: Medicare HMO | Admitting: Hematology & Oncology

## 2023-01-17 ENCOUNTER — Encounter: Payer: Self-pay | Admitting: Hematology & Oncology

## 2023-01-17 VITALS — BP 175/65 | HR 61 | Temp 98.0°F | Resp 20 | Ht 61.5 in | Wt 161.4 lb

## 2023-01-17 DIAGNOSIS — C9210 Chronic myeloid leukemia, BCR/ABL-positive, not having achieved remission: Secondary | ICD-10-CM | POA: Diagnosis not present

## 2023-01-17 DIAGNOSIS — Z803 Family history of malignant neoplasm of breast: Secondary | ICD-10-CM | POA: Insufficient documentation

## 2023-01-17 DIAGNOSIS — C9211 Chronic myeloid leukemia, BCR/ABL-positive, in remission: Secondary | ICD-10-CM | POA: Insufficient documentation

## 2023-01-17 LAB — CBC WITH DIFFERENTIAL (CANCER CENTER ONLY)
Abs Immature Granulocytes: 0.02 10*3/uL (ref 0.00–0.07)
Basophils Absolute: 0 10*3/uL (ref 0.0–0.1)
Basophils Relative: 1 %
Eosinophils Absolute: 0.2 10*3/uL (ref 0.0–0.5)
Eosinophils Relative: 6 %
HCT: 33.1 % — ABNORMAL LOW (ref 36.0–46.0)
Hemoglobin: 10.9 g/dL — ABNORMAL LOW (ref 12.0–15.0)
Immature Granulocytes: 1 %
Lymphocytes Relative: 29 %
Lymphs Abs: 1.2 10*3/uL (ref 0.7–4.0)
MCH: 32 pg (ref 26.0–34.0)
MCHC: 32.9 g/dL (ref 30.0–36.0)
MCV: 97.1 fL (ref 80.0–100.0)
Monocytes Absolute: 0.5 10*3/uL (ref 0.1–1.0)
Monocytes Relative: 12 %
Neutro Abs: 2.1 10*3/uL (ref 1.7–7.7)
Neutrophils Relative %: 51 %
Platelet Count: 157 10*3/uL (ref 150–400)
RBC: 3.41 MIL/uL — ABNORMAL LOW (ref 3.87–5.11)
RDW: 12.4 % (ref 11.5–15.5)
WBC Count: 4 10*3/uL (ref 4.0–10.5)
nRBC: 0 % (ref 0.0–0.2)

## 2023-01-17 LAB — CMP (CANCER CENTER ONLY)
ALT: 14 U/L (ref 0–44)
AST: 19 U/L (ref 15–41)
Albumin: 4.2 g/dL (ref 3.5–5.0)
Alkaline Phosphatase: 70 U/L (ref 38–126)
Anion gap: 7 (ref 5–15)
BUN: 30 mg/dL — ABNORMAL HIGH (ref 8–23)
CO2: 28 mmol/L (ref 22–32)
Calcium: 9.8 mg/dL (ref 8.9–10.3)
Chloride: 104 mmol/L (ref 98–111)
Creatinine: 1.1 mg/dL — ABNORMAL HIGH (ref 0.44–1.00)
GFR, Estimated: 50 mL/min — ABNORMAL LOW (ref >60)
Glucose, Bld: 99 mg/dL (ref 70–99)
Potassium: 4.4 mmol/L (ref 3.5–5.1)
Sodium: 139 mmol/L (ref 135–145)
Total Bilirubin: 0.5 mg/dL (ref 0.3–1.2)
Total Protein: 6.4 g/dL — ABNORMAL LOW (ref 6.5–8.1)

## 2023-01-17 LAB — IRON AND IRON BINDING CAPACITY (CC-WL,HP ONLY)
Iron: 65 ug/dL (ref 28–170)
Saturation Ratios: 20 % (ref 10.4–31.8)
TIBC: 332 ug/dL (ref 250–450)
UIBC: 267 ug/dL (ref 148–442)

## 2023-01-17 LAB — SAVE SMEAR(SSMR), FOR PROVIDER SLIDE REVIEW

## 2023-01-17 LAB — FERRITIN: Ferritin: 36 ng/mL (ref 11–307)

## 2023-01-17 LAB — LACTATE DEHYDROGENASE: LDH: 175 U/L (ref 98–192)

## 2023-01-17 NOTE — Telephone Encounter (Signed)
-----   Message from Volanda Napoleon, MD sent at 01/17/2023  3:05 PM EST ----- Call and let her know that the iron level is on the lower side.  She can take some over-the-counter iron supplements if she would like.  Thanks.  Laurey Arrow

## 2023-01-17 NOTE — Progress Notes (Signed)
Hematology and Oncology Follow Up Visit  Jodi Woods [id number] 21-Mar-1941 82 y.o. 01/17/2023   Principle Diagnosis:  CML - Chronic Phase -- MMR  Current Therapy:   Sprycel 50 mg po q other day -- change on 05/01/2020     Interim History:  Jodi Woods is in for follow-up.  The horrible news is that her daughter passed away in Nov 15, 2023.  She passed away on 11/20/2023 from metastatic breast cancer.  I am really devastated by this.  I just really hate it for this reason.  The other bad news is that she is going to have knee surgery for her right knee on Monday.  She stopped the Sprycel a week ago.  Thankfully, she is doing well on the Sprycel.  She is in a molecular remission.  She has had no problems with nausea or vomiting.  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  She does have some vision in the eye.  She has had no rashes.  There is been no fever.  She has had no issues with COVID.  There is been no diarrhea.  She has had no dysuria.  Overall, I would say that her performance status is probably ECOG 1.    Medications:  Current Outpatient Medications:    acetaminophen (TYLENOL) 650 MG CR tablet, Take 1,300 mg by mouth every 8 (eight) hours as needed for pain., Disp: , Rfl:    atorvastatin (LIPITOR) 20 MG tablet, Take 20 mg by mouth daily., Disp: , Rfl:    Cholecalciferol (VITAMIN D) 125 MCG (5000 UT) CAPS, Take 5,000 Units by mouth daily., Disp: , Rfl:    Cyanocobalamin (VITAMIN B12) 1000 MCG TBCR, Take 1,000 mcg by mouth 3 (three) times a week., Disp: , Rfl:    dasatinib (SPRYCEL) 50 MG tablet, TAKE 1 TABLET (50 MG TOTAL) BY MOUTH EVERY OTHER DAY., Disp: 15 tablet, Rfl: 8   famotidine (PEPCID) 20 MG tablet, Take 20 mg by mouth 2 (two) times daily., Disp: , Rfl:    levothyroxine (SYNTHROID) 125 MCG tablet, Take 125 mcg by mouth daily before breakfast., Disp: , Rfl:    Multiple Vitamin (MULTIVITAMIN WITH MINERALS) TABS tablet, Take 1 tablet by mouth daily., Disp: ,  Rfl:    polyethylene glycol (MIRALAX / GLYCOLAX) 17 g packet, Take 17 g by mouth daily as needed for moderate constipation., Disp: , Rfl:    telmisartan-hydrochlorothiazide (MICARDIS HCT) 80-25 MG tablet, Take 1 tablet by mouth daily., Disp: , Rfl:    aspirin EC 81 MG tablet, Take 81 mg by mouth daily. (Patient not taking: Reported on 01/17/2023), Disp: , Rfl:    cyclobenzaprine (FLEXERIL) 5 MG tablet, Take 5 mg by mouth 2 (two) times daily. (Patient not taking: Reported on 01/17/2023), Disp: , Rfl:    diclofenac Sodium (VOLTAREN) 1 % GEL, Apply 2 g topically daily as needed (knee pain). (Patient not taking: Reported on 01/17/2023), Disp: , Rfl:    ondansetron (ZOFRAN) 8 MG tablet, Take 1 tablet (8 mg total) by mouth every 8 (eight) hours as needed for nausea or vomiting. (Patient not taking: Reported on 01/17/2023), Disp: 40 tablet, Rfl: 3  Allergies:  Allergies  Allergen Reactions   Amlodipine Besylate Swelling    Swelling in legs   Amoxicillin-Pot Clavulanate Diarrhea   Versed [Midazolam]     PT does not want any Versed due to memory loss potential     Past Medical History, Surgical history, Social history, and Family History were reviewed  and updated.  Review of Systems: Review of Systems  Constitutional: Negative.   HENT:  Negative.    Eyes: Negative.   Respiratory: Negative.    Cardiovascular: Negative.   Gastrointestinal: Negative.   Endocrine: Negative.   Genitourinary: Negative.    Musculoskeletal: Negative.   Skin: Negative.   Neurological: Negative.   Hematological: Negative.   Psychiatric/Behavioral: Negative.      Physical Exam:  height is 5' 1.5" (1.562 m) and weight is 161 lb 6.4 oz (73.2 kg). Her oral temperature is 98 F (36.7 C). Her blood pressure is 175/65 (abnormal) and her pulse is 61. Her respiration is 20 and oxygen saturation is 100%.   Wt Readings from Last 3 Encounters:  01/17/23 161 lb 6.4 oz (73.2 kg)  01/08/23 157 lb (71.2 kg)  08/01/22 162 lb  12.8 oz (73.8 kg)    Physical Exam Vitals reviewed.  HENT:     Head: Normocephalic and atraumatic.  Eyes:     Pupils: Pupils are equal, round, and reactive to light.  Cardiovascular:     Rate and Rhythm: Normal rate and regular rhythm.     Heart sounds: Normal heart sounds.  Pulmonary:     Effort: Pulmonary effort is normal.     Breath sounds: Normal breath sounds.  Abdominal:     General: Bowel sounds are normal.     Palpations: Abdomen is soft.  Musculoskeletal:        General: No tenderness or deformity. Normal range of motion.     Cervical back: Normal range of motion.  Lymphadenopathy:     Cervical: No cervical adenopathy.  Skin:    General: Skin is warm and dry.     Findings: No erythema or rash.  Neurological:     Mental Status: She is alert and oriented to person, place, and time.  Psychiatric:        Behavior: Behavior normal.        Thought Content: Thought content normal.        Judgment: Judgment normal.     Lab Results  Component Value Date   WBC 4.0 01/17/2023   HGB 10.9 (L) 01/17/2023   HCT 33.1 (L) 01/17/2023   MCV 97.1 01/17/2023   PLT 157 01/17/2023     Chemistry      Component Value Date/Time   NA 139 01/17/2023 1015   K 4.4 01/17/2023 1015   CL 104 01/17/2023 1015   CO2 28 01/17/2023 1015   BUN 30 (H) 01/17/2023 1015   CREATININE 1.10 (H) 01/17/2023 1015      Component Value Date/Time   CALCIUM 9.8 01/17/2023 1015   ALKPHOS 70 01/17/2023 1015   AST 19 01/17/2023 1015   ALT 14 01/17/2023 1015   BILITOT 0.5 01/17/2023 1015      Impression and Plan: Jodi Woods is a a very charming 82 year old white female.  She looks somewhat younger than 82 years old.    She has chronic phase CML.  She has been in a major molecular remission.  We will have to see what the BCR/ABL level is now.  I do not see problems or have the knee surgery.  Again, she stopped the Sprycel a week ago.  She will start the Sprycel back about 2 weeks after  surgery.  I still think we can get her back in 6 months.  Hopefully, her BCR/ABL will still be undetectable.     Volanda Napoleon, MD 2/23/202411:59 AM

## 2023-01-20 ENCOUNTER — Other Ambulatory Visit: Payer: Self-pay

## 2023-01-20 ENCOUNTER — Ambulatory Visit (HOSPITAL_COMMUNITY): Payer: Medicare HMO | Admitting: Physician Assistant

## 2023-01-20 ENCOUNTER — Observation Stay (HOSPITAL_COMMUNITY)
Admission: RE | Admit: 2023-01-20 | Discharge: 2023-01-21 | Disposition: A | Discharge status: Home Health | Payer: Medicare HMO | Attending: Orthopedic Surgery | Admitting: Orthopedic Surgery

## 2023-01-20 ENCOUNTER — Encounter (HOSPITAL_COMMUNITY)
Admission: RE | Disposition: A | Discharge status: Home Health | Payer: Self-pay | Source: Home / Self Care | Attending: Orthopedic Surgery

## 2023-01-20 ENCOUNTER — Ambulatory Visit (HOSPITAL_BASED_OUTPATIENT_CLINIC_OR_DEPARTMENT_OTHER): Payer: Medicare HMO | Admitting: Certified Registered Nurse Anesthetist

## 2023-01-20 ENCOUNTER — Encounter (HOSPITAL_COMMUNITY): Payer: Self-pay | Admitting: Orthopedic Surgery

## 2023-01-20 ENCOUNTER — Observation Stay (HOSPITAL_COMMUNITY): Payer: Medicare HMO

## 2023-01-20 DIAGNOSIS — Z8673 Personal history of transient ischemic attack (TIA), and cerebral infarction without residual deficits: Secondary | ICD-10-CM | POA: Diagnosis not present

## 2023-01-20 DIAGNOSIS — E039 Hypothyroidism, unspecified: Secondary | ICD-10-CM | POA: Insufficient documentation

## 2023-01-20 DIAGNOSIS — Z471 Aftercare following joint replacement surgery: Secondary | ICD-10-CM | POA: Diagnosis not present

## 2023-01-20 DIAGNOSIS — Z7982 Long term (current) use of aspirin: Secondary | ICD-10-CM | POA: Diagnosis not present

## 2023-01-20 DIAGNOSIS — M1711 Unilateral primary osteoarthritis, right knee: Secondary | ICD-10-CM | POA: Diagnosis not present

## 2023-01-20 DIAGNOSIS — G8918 Other acute postprocedural pain: Secondary | ICD-10-CM | POA: Diagnosis not present

## 2023-01-20 DIAGNOSIS — Z79899 Other long term (current) drug therapy: Secondary | ICD-10-CM | POA: Diagnosis not present

## 2023-01-20 DIAGNOSIS — I10 Essential (primary) hypertension: Secondary | ICD-10-CM | POA: Insufficient documentation

## 2023-01-20 DIAGNOSIS — Z96651 Presence of right artificial knee joint: Secondary | ICD-10-CM | POA: Diagnosis not present

## 2023-01-20 HISTORY — PX: TOTAL KNEE ARTHROPLASTY: SHX125

## 2023-01-20 LAB — ABO/RH: ABO/RH(D): O POS

## 2023-01-20 SURGERY — ARTHROPLASTY, KNEE, TOTAL
Anesthesia: Spinal | Site: Knee | Laterality: Right

## 2023-01-20 MED ORDER — SODIUM CHLORIDE (PF) 0.9 % IJ SOLN
INTRAMUSCULAR | Status: AC
  Filled 2023-01-20: qty 10

## 2023-01-20 MED ORDER — PHENOL 1.4 % MT LIQD: 1.0000 | OROMUCOSAL | Status: DC | PRN

## 2023-01-20 MED ORDER — METHOCARBAMOL 500 MG IVPB - SIMPLE MED: 500.0000 mg | Freq: Four times a day (QID) | INTRAVENOUS | Status: DC | PRN

## 2023-01-20 MED ORDER — SODIUM CHLORIDE 0.9 % IV SOLN: INTRAVENOUS | Status: DC

## 2023-01-20 MED ORDER — SODIUM CHLORIDE (PF) 0.9 % IJ SOLN
INTRAMUSCULAR | Status: AC
  Filled 2023-01-20: qty 50

## 2023-01-20 MED ORDER — SODIUM CHLORIDE 0.9% FLUSH
INTRAVENOUS | Status: DC | PRN
  Administered 2023-01-20: 60 mL

## 2023-01-20 MED ORDER — PANTOPRAZOLE SODIUM 40 MG PO TBEC
40.0000 mg | DELAYED_RELEASE_TABLET | Freq: Every day | ORAL | Status: DC
  Administered 2023-01-20 – 2023-01-21 (×2): 40 mg via ORAL
  Filled 2023-01-20 (×2): qty 1

## 2023-01-20 MED ORDER — ACETAMINOPHEN 500 MG PO TABS
1000.0000 mg | ORAL_TABLET | Freq: Once | ORAL | Status: AC
  Administered 2023-01-20: 1000 mg via ORAL
  Filled 2023-01-20: qty 2

## 2023-01-20 MED ORDER — ATORVASTATIN CALCIUM 20 MG PO TABS
20.0000 mg | ORAL_TABLET | Freq: Every day | ORAL | Status: DC
  Administered 2023-01-21: 20 mg via ORAL
  Filled 2023-01-20: qty 1

## 2023-01-20 MED ORDER — ONDANSETRON HCL 4 MG/2ML IJ SOLN
INTRAMUSCULAR | Status: AC
  Filled 2023-01-20: qty 2

## 2023-01-20 MED ORDER — BUPIVACAINE LIPOSOME 1.3 % IJ SUSP: 20.0000 mL | Freq: Once | INTRAMUSCULAR | Status: DC

## 2023-01-20 MED ORDER — TELMISARTAN-HCTZ 80-25 MG PO TABS: 1.0000 | ORAL_TABLET | Freq: Every day | ORAL | Status: DC

## 2023-01-20 MED ORDER — CHOLECALCIFEROL 10 MCG (400 UNIT) PO TABS: 5000.0000 [IU] | ORAL_TABLET | Freq: Every day | ORAL | Status: DC

## 2023-01-20 MED ORDER — BUPIVACAINE LIPOSOME 1.3 % IJ SUSP
INTRAMUSCULAR | Status: AC
  Filled 2023-01-20: qty 20

## 2023-01-20 MED ORDER — PROPOFOL 500 MG/50ML IV EMUL
INTRAVENOUS | Status: DC | PRN
  Administered 2023-01-20: 75 ug/kg/min via INTRAVENOUS

## 2023-01-20 MED ORDER — OXYCODONE HCL 5 MG PO TABS
5.0000 mg | ORAL_TABLET | ORAL | Status: DC | PRN
  Administered 2023-01-20: 5 mg via ORAL
  Filled 2023-01-20 (×2): qty 1

## 2023-01-20 MED ORDER — FENTANYL CITRATE PF 50 MCG/ML IJ SOSY: 25.0000 ug | PREFILLED_SYRINGE | INTRAMUSCULAR | Status: DC | PRN

## 2023-01-20 MED ORDER — LACTATED RINGERS IV SOLN: INTRAVENOUS | Status: DC

## 2023-01-20 MED ORDER — ADULT MULTIVITAMIN W/MINERALS CH
1.0000 | ORAL_TABLET | Freq: Every day | ORAL | Status: DC
  Filled 2023-01-20: qty 1

## 2023-01-20 MED ORDER — ONDANSETRON HCL 4 MG/2ML IJ SOLN
INTRAMUSCULAR | Status: DC | PRN
  Administered 2023-01-20: 4 mg via INTRAVENOUS

## 2023-01-20 MED ORDER — 0.9 % SODIUM CHLORIDE (POUR BTL) OPTIME
TOPICAL | Status: DC | PRN
  Administered 2023-01-20: 1000 mL

## 2023-01-20 MED ORDER — BUPIVACAINE-EPINEPHRINE (PF) 0.5% -1:200000 IJ SOLN
INTRAMUSCULAR | Status: DC | PRN
  Administered 2023-01-20: 20 mL via PERINEURAL

## 2023-01-20 MED ORDER — POLYETHYLENE GLYCOL 3350 17 G PO PACK: 17.0000 g | PACK | Freq: Every day | ORAL | Status: DC | PRN

## 2023-01-20 MED ORDER — APIXABAN 2.5 MG PO TABS
2.5000 mg | ORAL_TABLET | Freq: Two times a day (BID) | ORAL | Status: DC
  Administered 2023-01-21: 2.5 mg via ORAL
  Filled 2023-01-20: qty 1

## 2023-01-20 MED ORDER — ACETAMINOPHEN 500 MG PO TABS
1000.0000 mg | ORAL_TABLET | Freq: Four times a day (QID) | ORAL | Status: AC
  Administered 2023-01-20 – 2023-01-21 (×4): 1000 mg via ORAL
  Filled 2023-01-20 (×4): qty 2

## 2023-01-20 MED ORDER — CHLORHEXIDINE GLUCONATE 0.12 % MT SOLN
15.0000 mL | Freq: Once | OROMUCOSAL | Status: AC
  Administered 2023-01-20: 15 mL via OROMUCOSAL

## 2023-01-20 MED ORDER — KETOROLAC TROMETHAMINE 15 MG/ML IJ SOLN
7.5000 mg | Freq: Four times a day (QID) | INTRAMUSCULAR | Status: AC
  Administered 2023-01-20 – 2023-01-21 (×4): 7.5 mg via INTRAVENOUS
  Filled 2023-01-20 (×4): qty 1

## 2023-01-20 MED ORDER — ONDANSETRON HCL 4 MG PO TABS
4.0000 mg | ORAL_TABLET | Freq: Four times a day (QID) | ORAL | Status: DC | PRN
  Administered 2023-01-20: 4 mg via ORAL
  Filled 2023-01-20: qty 1

## 2023-01-20 MED ORDER — ACETAMINOPHEN 325 MG PO TABS: 325.0000 mg | ORAL_TABLET | Freq: Four times a day (QID) | ORAL | Status: DC | PRN

## 2023-01-20 MED ORDER — PROPOFOL 1000 MG/100ML IV EMUL
INTRAVENOUS | Status: AC
  Filled 2023-01-20: qty 100

## 2023-01-20 MED ORDER — IRBESARTAN 150 MG PO TABS
300.0000 mg | ORAL_TABLET | Freq: Every day | ORAL | Status: DC
  Administered 2023-01-21: 300 mg via ORAL
  Filled 2023-01-20: qty 2

## 2023-01-20 MED ORDER — METOCLOPRAMIDE HCL 5 MG/ML IJ SOLN: 5.0000 mg | Freq: Three times a day (TID) | INTRAMUSCULAR | Status: DC | PRN

## 2023-01-20 MED ORDER — HYDROMORPHONE HCL 1 MG/ML IJ SOLN
0.5000 mg | INTRAMUSCULAR | Status: DC | PRN
  Administered 2023-01-20: 1 mg via INTRAVENOUS
  Filled 2023-01-20: qty 1

## 2023-01-20 MED ORDER — PROPOFOL 10 MG/ML IV BOLUS
INTRAVENOUS | Status: DC | PRN
  Administered 2023-01-20 (×2): 10 mg via INTRAVENOUS
  Administered 2023-01-20: 20 mg via INTRAVENOUS

## 2023-01-20 MED ORDER — BUPIVACAINE LIPOSOME 1.3 % IJ SUSP
INTRAMUSCULAR | Status: DC | PRN
  Administered 2023-01-20: 20 mL

## 2023-01-20 MED ORDER — LEVOTHYROXINE SODIUM 125 MCG PO TABS
125.0000 ug | ORAL_TABLET | Freq: Every day | ORAL | Status: DC
  Administered 2023-01-21: 125 ug via ORAL
  Filled 2023-01-20: qty 1

## 2023-01-20 MED ORDER — CEFAZOLIN SODIUM-DEXTROSE 2-4 GM/100ML-% IV SOLN
2.0000 g | Freq: Four times a day (QID) | INTRAVENOUS | Status: AC
  Administered 2023-01-20 (×2): 2 g via INTRAVENOUS
  Filled 2023-01-20 (×2): qty 100

## 2023-01-20 MED ORDER — PHENYLEPHRINE HCL-NACL 20-0.9 MG/250ML-% IV SOLN
INTRAVENOUS | Status: DC | PRN
  Administered 2023-01-20: 35 ug/min via INTRAVENOUS

## 2023-01-20 MED ORDER — ISOPROPYL ALCOHOL 70 % SOLN
Status: DC | PRN
  Administered 2023-01-20: 1 via TOPICAL

## 2023-01-20 MED ORDER — DIPHENHYDRAMINE HCL 12.5 MG/5ML PO ELIX: 12.5000 mg | ORAL_SOLUTION | ORAL | Status: DC | PRN

## 2023-01-20 MED ORDER — CEFAZOLIN SODIUM-DEXTROSE 2-4 GM/100ML-% IV SOLN
2.0000 g | INTRAVENOUS | Status: AC
  Administered 2023-01-20: 2 g via INTRAVENOUS
  Filled 2023-01-20: qty 100

## 2023-01-20 MED ORDER — HYDROCHLOROTHIAZIDE 25 MG PO TABS
25.0000 mg | ORAL_TABLET | Freq: Every day | ORAL | Status: DC
  Filled 2023-01-20: qty 1

## 2023-01-20 MED ORDER — ONDANSETRON HCL 4 MG/2ML IJ SOLN
4.0000 mg | Freq: Four times a day (QID) | INTRAMUSCULAR | Status: DC | PRN
  Administered 2023-01-20: 4 mg via INTRAVENOUS
  Filled 2023-01-20: qty 2

## 2023-01-20 MED ORDER — BUPIVACAINE IN DEXTROSE 0.75-8.25 % IT SOLN
INTRATHECAL | Status: DC | PRN
  Administered 2023-01-20: 1.8 mL via INTRATHECAL

## 2023-01-20 MED ORDER — FENTANYL CITRATE PF 50 MCG/ML IJ SOSY
100.0000 ug | PREFILLED_SYRINGE | INTRAMUSCULAR | Status: DC
  Administered 2023-01-20: 50 ug via INTRAVENOUS
  Filled 2023-01-20: qty 2

## 2023-01-20 MED ORDER — WATER FOR IRRIGATION, STERILE IR SOLN
Status: DC | PRN
  Administered 2023-01-20: 2000 mL

## 2023-01-20 MED ORDER — METOCLOPRAMIDE HCL 5 MG PO TABS: 5.0000 mg | ORAL_TABLET | Freq: Three times a day (TID) | ORAL | Status: DC | PRN

## 2023-01-20 MED ORDER — MENTHOL 3 MG MT LOZG: 1.0000 | LOZENGE | OROMUCOSAL | Status: DC | PRN

## 2023-01-20 MED ORDER — METHOCARBAMOL 500 MG PO TABS
500.0000 mg | ORAL_TABLET | Freq: Four times a day (QID) | ORAL | Status: DC | PRN
  Administered 2023-01-21: 500 mg via ORAL
  Filled 2023-01-20: qty 1

## 2023-01-20 MED ORDER — DOCUSATE SODIUM 100 MG PO CAPS
100.0000 mg | ORAL_CAPSULE | Freq: Two times a day (BID) | ORAL | Status: DC
  Administered 2023-01-20 – 2023-01-21 (×2): 100 mg via ORAL
  Filled 2023-01-20 (×2): qty 1

## 2023-01-20 MED ORDER — POVIDONE-IODINE 10 % EX SWAB: 2.0000 | Freq: Once | CUTANEOUS | Status: DC

## 2023-01-20 MED ORDER — TRANEXAMIC ACID-NACL 1000-0.7 MG/100ML-% IV SOLN
1000.0000 mg | INTRAVENOUS | Status: AC
  Administered 2023-01-20: 1000 mg via INTRAVENOUS
  Filled 2023-01-20: qty 100

## 2023-01-20 SURGICAL SUPPLY — 63 items
BAG COUNTER SPONGE SURGICOUNT (BAG) IMPLANT
BLADE SAG 18X100X1.27 (BLADE) ×1 IMPLANT
BLADE SAW SAG 35X64 .89 (BLADE) ×1 IMPLANT
BNDG COHESIVE 3X5 TAN ST LF (GAUZE/BANDAGES/DRESSINGS) ×1 IMPLANT
BNDG ELASTIC 6X10 VLCR STRL LF (GAUZE/BANDAGES/DRESSINGS) ×1 IMPLANT
BOWL SMART MIX CTS (DISPOSABLE) ×1 IMPLANT
CEMENT BONE R 1X40 (Cement) IMPLANT
CEMENT BONE REFOBACIN R1X40 US (Cement) IMPLANT
CHLORAPREP W/TINT 26 (MISCELLANEOUS) ×2 IMPLANT
COMP FEM CMT PERSONA SZ7 RT (Joint) ×1 IMPLANT
COMP PATELLAR 29 STD 8 THK (Orthopedic Implant) IMPLANT
COMPONENT FEM CMT PRSONA SZ7RT (Joint) IMPLANT
COVER SURGICAL LIGHT HANDLE (MISCELLANEOUS) ×1 IMPLANT
CUFF TOURN SGL QUICK 34 (TOURNIQUET CUFF) ×1
CUFF TRNQT CYL 34X4.125X (TOURNIQUET CUFF) ×1 IMPLANT
DERMABOND ADVANCED .7 DNX12 (GAUZE/BANDAGES/DRESSINGS) ×1 IMPLANT
DRAPE INCISE IOBAN 85X60 (DRAPES) ×1 IMPLANT
DRAPE SHEET LG 3/4 BI-LAMINATE (DRAPES) ×1 IMPLANT
DRAPE U-SHAPE 47X51 STRL (DRAPES) ×1 IMPLANT
DRESSING AQUACEL AG SP 3.5X10 (GAUZE/BANDAGES/DRESSINGS) ×1 IMPLANT
DRSG AQUACEL AG ADV 3.5X10 (GAUZE/BANDAGES/DRESSINGS) IMPLANT
DRSG AQUACEL AG SP 3.5X10 (GAUZE/BANDAGES/DRESSINGS) ×1
ELECT REM PT RETURN 15FT ADLT (MISCELLANEOUS) ×1 IMPLANT
GAUZE SPONGE 4X4 12PLY STRL (GAUZE/BANDAGES/DRESSINGS) ×1 IMPLANT
GLOVE BIO SURGEON STRL SZ 6.5 (GLOVE) ×2 IMPLANT
GLOVE BIOGEL PI IND STRL 6.5 (GLOVE) ×1 IMPLANT
GLOVE BIOGEL PI IND STRL 8 (GLOVE) ×1 IMPLANT
GLOVE SURG ORTHO 8.0 STRL STRW (GLOVE) ×2 IMPLANT
GOWN STRL REUS W/ TWL XL LVL3 (GOWN DISPOSABLE) ×2 IMPLANT
GOWN STRL REUS W/TWL XL LVL3 (GOWN DISPOSABLE) ×2
HANDPIECE INTERPULSE COAX TIP (DISPOSABLE) ×1
HDLS TROCR DRIL PIN KNEE 75 (PIN) ×1
HOLDER FOLEY CATH W/STRAP (MISCELLANEOUS) ×1 IMPLANT
HOOD PEEL AWAY T7 (MISCELLANEOUS) ×3 IMPLANT
LINER ASF PERS 10X6/7 CD RT (Liner) IMPLANT
MANIFOLD NEPTUNE II (INSTRUMENTS) ×1 IMPLANT
MARKER SKIN DUAL TIP RULER LAB (MISCELLANEOUS) ×1 IMPLANT
NS IRRIG 1000ML POUR BTL (IV SOLUTION) ×1 IMPLANT
PACK TOTAL KNEE CUSTOM (KITS) ×1 IMPLANT
PATELLA ZIMMER 29MM (Orthopedic Implant) ×1 IMPLANT
PIN DRILL HDLS TROCAR 75 4PK (PIN) IMPLANT
PROTECTOR NERVE ULNAR (MISCELLANEOUS) ×1 IMPLANT
SCREW HEADED 33MM KNEE (MISCELLANEOUS) IMPLANT
SET HNDPC FAN SPRY TIP SCT (DISPOSABLE) ×1 IMPLANT
SOLUTION IRRIG SURGIPHOR (IV SOLUTION) IMPLANT
SPIKE FLUID TRANSFER (MISCELLANEOUS) ×1 IMPLANT
STEM TIBIA 5 DEG SZ D R KNEE (Knees) IMPLANT
STRIP CLOSURE SKIN 1/2X4 (GAUZE/BANDAGES/DRESSINGS) ×1 IMPLANT
SUT MNCRL AB 3-0 PS2 18 (SUTURE) ×1 IMPLANT
SUT STRATAFIX 0 PDS 27 VIOLET (SUTURE) ×1
SUT STRATAFIX PDO 1 14 VIOLET (SUTURE) ×1
SUT STRATAFIX SPIRAL PDS+ 70CM (SUTURE) ×1
SUT STRATFX PDO 1 14 VIOLET (SUTURE) ×1
SUT VIC AB 2-0 CT2 27 (SUTURE) ×2 IMPLANT
SUTURE STRATFX 0 PDS 27 VIOLET (SUTURE) ×1 IMPLANT
SUTURE STRATFX PDO 1 14 VIOLET (SUTURE) ×1 IMPLANT
SUTURE STRATFX SPIRL PDS+ 70CM (SUTURE) IMPLANT
SYR 50ML LL SCALE MARK (SYRINGE) ×1 IMPLANT
TIBIA STEM 5 DEG SZ D R KNEE (Knees) ×1 IMPLANT
TRAY FOLEY MTR SLVR 14FR STAT (SET/KITS/TRAYS/PACK) IMPLANT
TUBE SUCTION HIGH CAP CLEAR NV (SUCTIONS) ×1 IMPLANT
UNDERPAD 30X36 HEAVY ABSORB (UNDERPADS AND DIAPERS) ×1 IMPLANT
WRAP KNEE MAXI GEL POST OP (GAUZE/BANDAGES/DRESSINGS) IMPLANT

## 2023-01-20 NOTE — Interval H&P Note (Signed)

## 2023-01-20 NOTE — Anesthesia Procedure Notes (Signed)
Anesthesia Regional Block: Adductor canal block   Pre-Anesthetic Checklist: , timeout performed,  Correct Patient, Correct Site, Correct Laterality,  Correct Procedure, Correct Position, site marked,  Risks and benefits discussed,  Pre-op evaluation,  At surgeon's request and post-op pain management  Laterality: Right  Prep: Maximum Sterile Barrier Precautions used, chloraprep       Needles:  Injection technique: Single-shot  Needle Type: Echogenic Stimulator Needle     Needle Length: 9cm  Needle Gauge: 21     Additional Needles:   Procedures:,,,, ultrasound used (permanent image in chart),,    Narrative:  Start time: 01/20/2023 10:42 AM End time: 01/20/2023 10:52 AM Injection made incrementally with aspirations every 5 mL.  Performed by: Personally  Anesthesiologist: Roderic Palau, MD

## 2023-01-20 NOTE — Transfer of Care (Signed)
Immediate Anesthesia Transfer of Care Note  Patient: Jodi Woods  Procedure(s) Performed: TOTAL KNEE ARTHROPLASTY (Right: Knee)  Patient Location: PACU  Anesthesia Type:Spinal  Level of Consciousness: awake, alert , and oriented  Airway & Oxygen Therapy: Patient Spontanous Breathing and Patient connected to face mask oxygen  Post-op Assessment: Report given to RN and Post -op Vital signs reviewed and stable  Post vital signs: Reviewed and stable  Last Vitals:  Vitals Value Taken Time  BP    Temp    Pulse 61 01/20/23 1334  Resp 19 01/20/23 1334  SpO2 96 % 01/20/23 1334  Vitals shown include unvalidated device data.  Last Pain:  Vitals:   01/20/23 1105  TempSrc:   PainSc: 0-No pain         Complications: No notable events documented.

## 2023-01-20 NOTE — Discharge Instructions (Addendum)
INSTRUCTIONS AFTER JOINT REPLACEMENT   Remove items at home which could result in a fall. This includes throw rugs or furniture in walking pathways ICE to the affected joint every three hours while awake for 30 minutes at a time, for at least the first 3-5 days, and then as needed for pain and swelling.  Continue to use ice for pain and swelling. You may notice swelling that will progress down to the foot and ankle.  This is normal after surgery.  Elevate your leg when you are not up walking on it.   Continue to use the breathing machine you got in the hospital (incentive spirometer) which will help keep your temperature down.  It is common for your temperature to cycle up and down following surgery, especially at night when you are not up moving around and exerting yourself.  The breathing machine keeps your lungs expanded and your temperature down.   DIET:  As you were doing prior to hospitalization, we recommend a well-balanced diet.  DRESSING / WOUND CARE / SHOWERING  Keep the surgical dressing until follow up.  The dressing is water proof, so you can shower without any extra covering.  IF THE DRESSING FALLS OFF or the wound gets wet inside, change the dressing with sterile gauze.  Please use good hand washing techniques before changing the dressing.  Do not use any lotions or creams on the incision until instructed by your surgeon.    ACTIVITY  Increase activity slowly as tolerated, but follow the weight bearing instructions below.   No driving for 6 weeks or until further direction given by your physician.  You cannot drive while taking narcotics.  No lifting or carrying greater than 10 lbs. until further directed by your surgeon. Avoid periods of inactivity such as sitting longer than an hour when not asleep. This helps prevent blood clots.  You may return to work once you are authorized by your doctor.     WEIGHT BEARING   Weight bearing as tolerated with assist device (walker, cane,  etc) as directed, use it as long as suggested by your surgeon or therapist, typically at least 4-6 weeks.   EXERCISES  Results after joint replacement surgery are often greatly improved when you follow the exercise, range of motion and muscle strengthening exercises prescribed by your doctor. Safety measures are also important to protect the joint from further injury. Any time any of these exercises cause you to have increased pain or swelling, decrease what you are doing until you are comfortable again and then slowly increase them. If you have problems or questions, call your caregiver or physical therapist for advice.   Rehabilitation is important following a joint replacement. After just a few days of immobilization, the muscles of the leg can become weakened and shrink (atrophy).  These exercises are designed to build up the tone and strength of the thigh and leg muscles and to improve motion. Often times heat used for twenty to thirty minutes before working out will loosen up your tissues and help with improving the range of motion but do not use heat for the first two weeks following surgery (sometimes heat can increase post-operative swelling).   These exercises can be done on a training (exercise) mat, on the floor, on a table or on a bed. Use whatever works the best and is most comfortable for you.    Use music or television while you are exercising so that the exercises are a pleasant break in your   day. This will make your life better with the exercises acting as a break in your routine that you can look forward to.   Perform all exercises about fifteen times, three times per day or as directed.  You should exercise both the operative leg and the other leg as well.  Exercises include:   Quad Sets - Tighten up the muscle on the front of the thigh (Quad) and hold for 5-10 seconds.   Straight Leg Raises - With your knee straight (if you were given a brace, keep it on), lift the leg to 60  degrees, hold for 3 seconds, and slowly lower the leg.  Perform this exercise against resistance later as your leg gets stronger.  Leg Slides: Lying on your back, slowly slide your foot toward your buttocks, bending your knee up off the floor (only go as far as is comfortable). Then slowly slide your foot back down until your leg is flat on the floor again.  Angel Wings: Lying on your back spread your legs to the side as far apart as you can without causing discomfort.  Hamstring Strength:  Lying on your back, push your heel against the floor with your leg straight by tightening up the muscles of your buttocks.  Repeat, but this time bend your knee to a comfortable angle, and push your heel against the floor.  You may put a pillow under the heel to make it more comfortable if necessary.   A rehabilitation program following joint replacement surgery can speed recovery and prevent re-injury in the future due to weakened muscles. Contact your doctor or a physical therapist for more information on knee rehabilitation.    CONSTIPATION  Constipation is defined medically as fewer than three stools per week and severe constipation as less than one stool per week.  Even if you have a regular bowel pattern at home, your normal regimen is likely to be disrupted due to multiple reasons following surgery.  Combination of anesthesia, postoperative narcotics, change in appetite and fluid intake all can affect your bowels.   YOU MUST use at least one of the following options; they are listed in order of increasing strength to get the job done.  They are all available over the counter, and you may need to use some, POSSIBLY even all of these options:    Drink plenty of fluids (prune juice may be helpful) and high fiber foods Colace 100 mg by mouth twice a day  Senokot for constipation as directed and as needed Dulcolax (bisacodyl), take with full glass of water  Miralax (polyethylene glycol) once or twice a day as  needed.  If you have tried all these things and are unable to have a bowel movement in the first 3-4 days after surgery call either your surgeon or your primary doctor.    If you experience loose stools or diarrhea, hold the medications until you stool forms back up.  If your symptoms do not get better within 1 week or if they get worse, check with your doctor.  If you experience "the worst abdominal pain ever" or develop nausea or vomiting, please contact the office immediately for further recommendations for treatment.   ITCHING:  If you experience itching with your medications, try taking only a single pain pill, or even half a pain pill at a time.  You can also use Benadryl over the counter for itching or also to help with sleep.   TED HOSE STOCKINGS:  Use stockings on both   legs until for at least 2 weeks or as directed by physician office. They may be removed at night for sleeping.  MEDICATIONS:  See your medication summary on the "After Visit Summary" that nursing will review with you.  You may have some home medications which will be placed on hold until you complete the course of blood thinner medication.  It is important for you to complete the blood thinner medication as prescribed.   Blood clot prevention (DVT Prophylaxis): After surgery you are at an increased risk for a blood clot. you were prescribed a blood thinner, eliquis, to be taken twice daily for a total of 4 weeks from surgery to help reduce your risk of getting a blood clot. This will help prevent a blood clot. Signs of a pulmonary embolus (blood clot in the lungs) include sudden short of breath, feeling lightheaded or dizzy, chest pain with a deep breath, rapid pulse rapid breathing. Signs of a blood clot in your arms or legs include new unexplained swelling and cramping, warm, red or darkened skin around the painful area. Please call the office or 911 right away if these signs or symptoms develop.  PRECAUTIONS:  If you  experience chest pain or shortness of breath - call 911 immediately for transfer to the hospital emergency department.   If you develop a fever greater that 101 F, purulent drainage from wound, increased redness or drainage from wound, foul odor from the wound/dressing, or calf pain - CONTACT YOUR SURGEON.                                                   FOLLOW-UP APPOINTMENTS:  If you do not already have a post-op appointment, please call the office for an appointment to be seen by your surgeon.  Guidelines for how soon to be seen are listed in your "After Visit Summary", but are typically between 2-3 weeks after surgery.  OTHER INSTRUCTIONS:   Knee Replacement:  Do not place pillow under knee, focus on keeping the knee straight while resting. CPM instructions: 0-90 degrees, 2 hours in the morning, 2 hours in the afternoon, and 2 hours in the evening. Place foam block, curve side up under heel at all times except when in CPM or when walking.   DO NOT modify, tear, cut, or change the foam block in any way.  POST-OPERATIVE OPIOID TAPER INSTRUCTIONS: It is important to wean off of your opioid medication as soon as possible. If you do not need pain medication after your surgery it is ok to stop day one. Opioids include: Codeine, Hydrocodone(Norco, Vicodin), Oxycodone(Percocet, oxycontin) and hydromorphone amongst others.  Long term and even short term use of opiods can cause: Increased pain response Dependence Constipation Depression Respiratory depression And more.  Withdrawal symptoms can include Flu like symptoms Nausea, vomiting And more Techniques to manage these symptoms Hydrate well Eat regular healthy meals Stay active Use relaxation techniques(deep breathing, meditating, yoga) Do Not substitute Alcohol to help with tapering If you have been on opioids for less than two weeks and do not have pain than it is ok to stop all together.  Plan to wean off of opioids This plan should  start within one week post op of your joint replacement. Maintain the same interval or time between taking each dose and first decrease the dose.  Cut the  total daily intake of opioids by one tablet each day Next start to increase the time between doses. The last dose that should be eliminated is the evening dose.   MAKE SURE YOU:  Understand these instructions.  Get help right away if you are not doing well or get worse.    Thank you for letting us be a part of your medical care team.  It is a privilege we respect greatly.  We hope these instructions will help you stay on track for a fast and full recovery!    Information on my medicine - ELIQUIS (apixaban)  Why was Eliquis prescribed for you? Eliquis was prescribed for you to reduce the risk of blood clots forming after orthopedic surgery.    What do You need to know about Eliquis? Take your Eliquis TWICE DAILY - one tablet in the morning and one tablet in the evening with or without food.  It would be best to take the dose about the same time each day.  If you have difficulty swallowing the tablet whole please discuss with your pharmacist how to take the medication safely.  Take Eliquis exactly as prescribed by your doctor and DO NOT stop taking Eliquis without talking to the doctor who prescribed the medication.  Stopping without other medication to take the place of Eliquis may increase your risk of developing a clot.  After discharge, you should have regular check-up appointments with your healthcare provider that is prescribing your Eliquis.  What do you do if you miss a dose? If a dose of ELIQUIS is not taken at the scheduled time, take it as soon as possible on the same day and twice-daily administration should be resumed.  The dose should not be doubled to make up for a missed dose.  Do not take more than one tablet of ELIQUIS at the same time.  Important Safety Information A possible side effect of Eliquis is  bleeding. You should call your healthcare provider right away if you experience any of the following: Bleeding from an injury or your nose that does not stop. Unusual colored urine (red or dark brown) or unusual colored stools (red or black). Unusual bruising for unknown reasons. A serious fall or if you hit your head (even if there is no bleeding).  Some medicines may interact with Eliquis and might increase your risk of bleeding or clotting while on Eliquis. To help avoid this, consult your healthcare provider or pharmacist prior to using any new prescription or non-prescription medications, including herbals, vitamins, non-steroidal anti-inflammatory drugs (NSAIDs) and supplements.  This website has more information on Eliquis (apixaban): http://www.eliquis.com/eliquis/home

## 2023-01-20 NOTE — Anesthesia Postprocedure Evaluation (Signed)
Anesthesia Post Note  Patient: Sharran Dausch  Procedure(s) Performed: TOTAL KNEE ARTHROPLASTY (Right: Knee)     Patient location during evaluation: PACU Anesthesia Type: Spinal Level of consciousness: oriented and awake and alert Pain management: pain level controlled Vital Signs Assessment: post-procedure vital signs reviewed and stable Respiratory status: spontaneous breathing and respiratory function stable Cardiovascular status: blood pressure returned to baseline and stable Postop Assessment: no headache, no backache, no apparent nausea or vomiting, spinal receding and patient able to bend at knees Anesthetic complications: no  No notable events documented.  Last Vitals:  Vitals:   01/20/23 1415 01/20/23 1445  BP: (!) 162/51 (!) 144/51  Pulse: (!) 51 (!) 56  Resp: 15 17  Temp:    SpO2: 99% 98%    Last Pain:  Vitals:   01/20/23 1445  TempSrc:   PainSc: 0-No pain                 Matin Mattioli,W. EDMOND

## 2023-01-20 NOTE — Anesthesia Procedure Notes (Signed)
Procedure Name: MAC Date/Time: 01/20/2023 11:19 AM  Performed by: Maxwell Caul, CRNAPre-anesthesia Checklist: Patient identified, Emergency Drugs available, Suction available and Patient being monitored Oxygen Delivery Method: Simple face mask

## 2023-01-20 NOTE — Anesthesia Preprocedure Evaluation (Addendum)
Anesthesia Evaluation  Patient identified by MRN, date of birth, ID band Patient awake    Reviewed: Allergy & Precautions, H&P , NPO status , Patient's Chart, lab work & pertinent test results  Airway Mallampati: II  TM Distance: >3 FB Neck ROM: Full    Dental no notable dental hx. (+) Teeth Intact, Dental Advisory Given   Pulmonary neg pulmonary ROS   Pulmonary exam normal breath sounds clear to auscultation       Cardiovascular hypertension, Pt. on medications  Rhythm:Regular Rate:Normal     Neuro/Psych CVA  negative psych ROS   GI/Hepatic negative GI ROS, Neg liver ROS,,,  Endo/Other  Hypothyroidism    Renal/GU negative Renal ROS  negative genitourinary   Musculoskeletal  (+) Arthritis , Osteoarthritis,    Abdominal   Peds  Hematology  (+) Blood dyscrasia, anemia   Anesthesia Other Findings   Reproductive/Obstetrics negative OB ROS                             Anesthesia Physical Anesthesia Plan  ASA: 2  Anesthesia Plan: Spinal   Post-op Pain Management: Regional block* and Tylenol PO (pre-op)*   Induction: Intravenous  PONV Risk Score and Plan: 3 and Ondansetron, Dexamethasone and Treatment may vary due to age or medical condition  Airway Management Planned: Natural Airway and Simple Face Mask  Additional Equipment:   Intra-op Plan:   Post-operative Plan:   Informed Consent: I have reviewed the patients History and Physical, chart, labs and discussed the procedure including the risks, benefits and alternatives for the proposed anesthesia with the patient or authorized representative who has indicated his/her understanding and acceptance.     Dental advisory given  Plan Discussed with: CRNA  Anesthesia Plan Comments:        Anesthesia Quick Evaluation

## 2023-01-20 NOTE — Op Note (Addendum)
DATE OF SURGERY:  01/20/2023 TIME: 12:55 PM  PATIENT NAME:  Jodi Woods   AGE: 82 y.o.    PRE-OPERATIVE DIAGNOSIS: End-stage right knee osteoarthritis  POST-OPERATIVE DIAGNOSIS:  Same  PROCEDURE: Right total Knee Arthroplasty  SURGEON:  Danyla Wattley A Terald Jump, MD   ASSISTANT: Izola Price, RNFA, present and scrubbed throughout the case, critical for assistance with exposure, retraction, instrumentation, and closure.   OPERATIVE IMPLANTS:  Cemented Zimmer persona size 7 standard femur, D tibial baseplate, 29 mm all poly patella, 10 mm MC poly insert Implant Name Type Inv. Item Serial No. Manufacturer Lot No. LRB No. Used Action  CEMENT BONE R 1X40 - [id number] Cement CEMENT BONE R 1X40  [employer]IMMER [employer]ON(ORTH,TRAU,BIO,SG) [id number] Right 1 Implanted  CEMENT BONE R 1X40 - [id number] Cement CEMENT BONE R 1X40  [employer](ORTH,TRAU,BIO,SG) [id number] Right 1 Implanted  COMP FEM CMT PERSONA SZ7 RT - [id number] Joint COMP FEM CMT PERSONA SZ7 RT  [employer]MER [employer]ON(ORTH,TRAU,BIO,SG) [id number] Right 1 Implanted  TIBIA STEM 5 DEG SZ D R KNEE - [id number] Knees TIBIA STEM 5 DEG SZ D R KNEE  ZIMMER [employer]ON(ORTH,TRAU,BIO,SG) [id number] Right 1 Implanted  LINER ASF PERS 10X6/7 CD RT - [id number] Liner LINER ASF PERS 10X6/7 CD RT  ZIMMER RECON(ORTH,TRAU,BIO,SG) [id number] Right 1 Implanted      PREOPERATIVE INDICATIONS:  Jodi Woods is a 82 y.o. year old female with end stage bone on bone degenerative arthritis of the knee who failed conservative treatment, including injections, antiinflammatories, activity modification, and assistive devices, and had significant impairment of their activities of daily living, and elected for Total Knee Arthroplasty.   The risks, benefits, and alternatives were discussed at length including but not limited to the risks of infection, bleeding, nerve injury, stiffness, blood clots, the need for revision surgery, cardiopulmonary complications, among others, and they were willing  to proceed.  ESTIMATED BLOOD LOSS: 50cc  OPERATIVE DESCRIPTION:   Once adequate anesthesia was induced, preoperative antibiotics, 2 gm of ancef,1 gm of Tranexamic Acid, and 8 mg of Decadron administered, the patient was positioned supine with a right thigh tourniquet placed.  The right lower extremity was prepped and draped in sterile fashion.  A time-  out was performed identifying the patient, planned procedure, and the appropriate extremity.     The leg was  exsanguinated, tourniquet elevated to 250 mmHg.  A midline incision was  made followed by median parapatellar arthrotomy. Anterior horn of the medial meniscus was released and resected. A medial release was performed, the infrapatellar fat pad was resected with care taken to protect the patellar tendon. The suprapatellar fat was removed to exposed the distal anterior femur. The anterior horn of the lateral meniscus and ACL were released.    Following initial  exposure, I first started with the femur  The femoral  canal was opened with a drill, canal was suctioned to try to prevent fat emboli.  An  intramedullary rod was passed set at 5 degrees valgus, 10 mm. The distal femur was resected.  Following this resection, the tibia was  subluxated anteriorly.  Using the extramedullary guide, 10 mm of bone was resected off the proximal lateral tibia anteriorly where she still had good intact cartilage.  We confirmed the gap would be  stable medially and laterally with a size 84m spacer block as well as confirmed that the tibial cut was perpendicular in the coronal plane, checking with an alignment rod.    Once this was done, the posterior femoral referencing femoral sizer was placed under  to the posterior condyles with 5 degrees of external rotational which was parallel to the transepicondylar axis and perpendicular to [employer]. The femur was sized to be a size 7 in the anterior-  posterior dimension. The  anterior, posterior, and  chamfer cuts  were made without difficulty nor   notching making certain that I was along the anterior cortex to help  with flexion gap stability. Next a laminar spreader was placed with the knee in flexion and the medial lateral menisci were resected.  5 cc of the Exparel mixture was injected in the medial side of the back of the knee and 3 cc in the lateral side.  1/2 inch curved osteotome was used to resect posterior osteophyte that was then removed with a pituitary rongeur.       At this point, the tibia was sized to be a size D.  The size D tray was  then pinned in position. Trial reduction was now carried with a 7 femur, D tibia, a 10 mm MC insert.  The knee had full extension and was stable to varus valgus stress in extension.  The knee was slightly tight in flexion and the PCL was partially released.  The knee was also tight laterally in extension and the IT band was pie crusted.  Attention was next directed to the patella.  Precut  measurement was noted to be 22 mm.  I resected down to 13 mm and used a  47m patellar button to restore patellar height as well as cover the cut surface.     The patella lug holes were drilled and a 259mpatella poly trial was placed.    The knee was brought to full extension with good flexion stability with the patella tracking through the trochlea without application of pressure.     Next the femoral component was again assessed and determined to be seated and appropriately lateralized.  The femoral lug holes were drilled.  The femoral component was then removed. Tibial component was again assessed and felt to be seated and appropriately rotated with the medial third of the tubercle. The tibia was then drilled, and keel punched.     Final components were  opened and cement was mixed.      Final implants were then  cemented onto cleaned and dried cut surfaces of bone with the knee brought to extension with a 10 mm MC poly.  The knee was irrigated with sterile Betadine  diluted in saline as well as pulse lavage normal saline.  The synovial lining was  then injected a dilute Exparel.      Once the cement had fully cured, excess cement was removed throughout the knee.  I confirmed that I was satisfied with the range of motion and stability, and the final 109mC poly insert was chosen.  It was placed into the knee.         The tourniquet had been let down at 56 minutes.  No significant hemostasis was required.  The medial parapatellar arthrotomy was then reapproximated using #1 Stratafix sutures with the knee  in flexion.  The remaining wound was closed with 0 stratafix, 2-0 Vicryl, and running 3-0 Monocryl. The knee was cleaned, dried, dressed sterilely using Dermabond and   Aquacel dressing.  The patient was then brought to recovery room in stable condition, tolerating the procedure  well. There were no complications.   Post op recs: WB: WBAT Abx: ancef Imaging: PACU xrays DVT prophylaxis: Eliquis 2.5 mg  twice daily given history of cancer. Follow up: 2 weeks after surgery for a wound check with Dr. Zachery Dakins at [facility].  Address: [address], [place], [place] [address]  Office Phone: [phone]  Charlies Constable, MD Orthopaedic Surgery

## 2023-01-20 NOTE — Progress Notes (Signed)
Orthopedic Tech Progress Note Patient Details:  Jodi Woods Nov 29, 1940 [id number]  Ortho Devices Type of Ortho Device: Bone foam zero knee Ortho Device/Splint Interventions: Ordered      [place] 01/20/2023, 1:55 PM

## 2023-01-20 NOTE — Progress Notes (Signed)
PT Note  Patient Details Name: Jodi Woods MRN: [id number] DOB: 04/12/1941   Cancelled Eval:     PT unable to complete evaluation 01/20/2023 with pt s/p R TKA due to post-op N and V. PT educated pt and family per anticipated eval on 01/21/2023 with anticipation of medical stability improving for improved ability to actively participate with skilled assessment. Pt and family demonstrated verbal understanding. Pt in bed, family and Nursing present and all needs in place.   Baird Lyons, PT  Adair Patter 01/20/2023, 7:07 PM

## 2023-01-20 NOTE — Anesthesia Procedure Notes (Signed)
Spinal  Patient location during procedure: OR Start time: 01/20/2023 11:28 AM End time: 01/20/2023 11:33 AM Reason for block: surgical anesthesia Staffing Performed: anesthesiologist  Anesthesiologist: Roderic Palau, MD Performed by: Roderic Palau, MD Authorized by: Roderic Palau, MD   Preanesthetic Checklist Completed: patient identified, IV checked, risks and benefits discussed, surgical consent, monitors and equipment checked, pre-op evaluation and timeout performed Spinal Block Patient position: sitting Prep: DuraPrep Patient monitoring: cardiac monitor, continuous pulse ox and blood pressure Approach: right paramedian Location: L3-4 Injection technique: single-shot Needle Needle type: Quincke  Needle gauge: 22 G Needle length: 9 cm Assessment Sensory level: T8 Events: CSF return and second provider Additional Notes Functioning IV was confirmed and monitors were applied. Sterile prep and drape, including hand hygiene and sterile gloves were used. The patient was positioned and the spine was prepped. The skin was anesthetized with lidocaine.  Free flow of clear CSF was obtained prior to injecting local anesthetic into the CSF.  The spinal needle aspirated freely following injection.  The needle was carefully withdrawn.  The patient tolerated the procedure well.

## 2023-01-21 DIAGNOSIS — Z79899 Other long term (current) drug therapy: Secondary | ICD-10-CM | POA: Diagnosis not present

## 2023-01-21 DIAGNOSIS — E039 Hypothyroidism, unspecified: Secondary | ICD-10-CM | POA: Diagnosis not present

## 2023-01-21 DIAGNOSIS — Z96651 Presence of right artificial knee joint: Secondary | ICD-10-CM | POA: Diagnosis not present

## 2023-01-21 DIAGNOSIS — Z8673 Personal history of transient ischemic attack (TIA), and cerebral infarction without residual deficits: Secondary | ICD-10-CM | POA: Diagnosis not present

## 2023-01-21 DIAGNOSIS — Z7982 Long term (current) use of aspirin: Secondary | ICD-10-CM | POA: Diagnosis not present

## 2023-01-21 DIAGNOSIS — I10 Essential (primary) hypertension: Secondary | ICD-10-CM | POA: Diagnosis not present

## 2023-01-21 DIAGNOSIS — M1711 Unilateral primary osteoarthritis, right knee: Secondary | ICD-10-CM | POA: Diagnosis not present

## 2023-01-21 LAB — BASIC METABOLIC PANEL
Anion gap: 6 (ref 5–15)
BUN: 28 mg/dL — ABNORMAL HIGH (ref 8–23)
CO2: 27 mmol/L (ref 22–32)
Calcium: 8.6 mg/dL — ABNORMAL LOW (ref 8.9–10.3)
Chloride: 103 mmol/L (ref 98–111)
Creatinine, Ser: 0.98 mg/dL (ref 0.44–1.00)
GFR, Estimated: 58 mL/min — ABNORMAL LOW (ref >60)
Glucose, Bld: 141 mg/dL — ABNORMAL HIGH (ref 70–99)
Potassium: 4.5 mmol/L (ref 3.5–5.1)
Sodium: 136 mmol/L (ref 135–145)

## 2023-01-21 LAB — CBC
HCT: 28.2 % — ABNORMAL LOW (ref 36.0–46.0)
Hemoglobin: 9.2 g/dL — ABNORMAL LOW (ref 12.0–15.0)
MCH: 31.7 pg (ref 26.0–34.0)
MCHC: 32.6 g/dL (ref 30.0–36.0)
MCV: 97.2 fL (ref 80.0–100.0)
Platelets: 151 10*3/uL (ref 150–400)
RBC: 2.9 MIL/uL — ABNORMAL LOW (ref 3.87–5.11)
RDW: 12.7 % (ref 11.5–15.5)
WBC: 5.2 10*3/uL (ref 4.0–10.5)
nRBC: 0 % (ref 0.0–0.2)

## 2023-01-21 MED ORDER — APIXABAN 2.5 MG PO TABS: 2.5000 mg | ORAL_TABLET | Freq: Two times a day (BID) | ORAL | 0 refills | Status: DC

## 2023-01-21 MED ORDER — METHOCARBAMOL 500 MG PO TABS: 500.0000 mg | ORAL_TABLET | Freq: Three times a day (TID) | ORAL | 0 refills | Status: AC | PRN

## 2023-01-21 MED ORDER — ACETAMINOPHEN 500 MG PO TABS: 1000.0000 mg | ORAL_TABLET | Freq: Three times a day (TID) | ORAL | 0 refills | Status: AC | PRN

## 2023-01-21 MED ORDER — OXYCODONE HCL 5 MG PO TABS: 5.0000 mg | ORAL_TABLET | ORAL | 0 refills | Status: AC | PRN

## 2023-01-21 MED ORDER — ONDANSETRON HCL 4 MG PO TABS: 4.0000 mg | ORAL_TABLET | Freq: Three times a day (TID) | ORAL | 0 refills | Status: AC | PRN

## 2023-01-21 NOTE — Progress Notes (Signed)
PT TX NOTE  01/21/23 1400  PT Visit Information  Last PT Received On 01/21/23  Assistance Needed +1  Pt is progressing well, meeting goals. Feels ready to d/c home with family support. PT in agreement. 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 PMH: CML, HTN, anemia  Precautions  Precautions Knee;Fall  Restrictions  Weight Bearing Restrictions No  RLE Weight Bearing WBAT  Pain Assessment  Pain Assessment Faces  Faces Pain Scale 2  Pain Location right knee  Pain Descriptors / Indicators Aching;Sore  Pain Intervention(s) Limited activity within patient's tolerance;Monitored during session;Repositioned  Cognition  Arousal/Alertness Awake/alert  Behavior During Therapy WFL for tasks assessed/performed  Overall Cognitive Status Within Functional Limits for tasks assessed  Bed Mobility  General bed mobility comments in recliner  Transfers  Overall transfer level Needs assistance  Equipment used Rolling walker (2 wheels)  Transfers Sit to/from Stand  Sit to Stand Supervision  General transfer comment for safety, cues to back up to surface, pt demonstrates carryover for correct hand placement from previous session  Ambulation/Gait  Ambulation/Gait assistance Supervision  Gait Distance (Feet) 200 Feet  Assistive device Rolling walker (2 wheels)  Gait Pattern/deviations Step-through pattern  General Gait Details cues for posture, shoulder depression. good stability with RW support, no LOB, supervision for safety  Stairs  (verbally reviewed, has ramp)  Total Joint Exercises  Long Arc Quad AROM;Right;5 reps;Seated  PT - End of Session  Equipment Utilized During Treatment Gait belt  Activity Tolerance Patient tolerated treatment well  Patient left in chair;with call bell/phone within reach;with chair alarm set;with family/visitor present  Nurse Communication Mobility status   PT - Assessment/Plan  PT Plan Current plan remains appropriate  PT  Visit Diagnosis Other abnormalities of gait and mobility (R26.89);Difficulty in walking, not elsewhere classified (R26.2)  PT Frequency (ACUTE ONLY) 7X/week  Follow Up Recommendations Follow physician's recommendations for discharge plan and follow up therapies  Assistance recommended at discharge Intermittent Supervision/Assistance  Patient can return home with the following Assistance with cooking/housework;Assist for transportation;Help with stairs or ramp for entrance  PT equipment None recommended by PT  AM-PAC PT "6 Clicks" Mobility Outcome Measure (Version 2)  Help needed turning from your back to your side while in a flat bed without using bedrails? 4  Help needed moving from lying on your back to sitting on the side of a flat bed without using bedrails? 4  Help needed moving to and from a bed to a chair (including a wheelchair)? 3  Help needed standing up from a chair using your arms (e.g., wheelchair or bedside chair)? 3  Help needed to walk in hospital room? 3  Help needed climbing 3-5 steps with a railing?  3  6 Click Score 20  Consider Recommendation of Discharge To: Home with no services  Acute Rehab PT Goals  PT Goal Formulation With patient  Time For Goal Achievement 01/28/23  Potential to Achieve Goals Good  PT Time Calculation  PT Start Time (ACUTE ONLY) 1328  PT Stop Time (ACUTE ONLY) 1345  PT Time Calculation (min) (ACUTE ONLY) 17 min  PT General Charges  $$ ACUTE PT VISIT 1 Visit  PT Treatments  $Gait Training 8-22 mins

## 2023-01-21 NOTE — Discharge Summary (Signed)
Physician Discharge Summary  Patient ID: Jodi Woods MRN: [id number] DOB/AGE: [birth year] 83 y.o.  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 female who was admitted 01/20/2023 with a diagnosis of Localized osteoarthritis of right knee and went to the operating room on 01/20/2023 and underwent the above named procedures.    She was given perioperative antibiotics:  Anti-infectives (From admission, onward)    Start     Dose/Rate Route Frequency Ordered Stop   01/20/23 1800  ceFAZolin (ANCEF) IVPB 2g/100 mL premix        2 g 200 mL/hr over 30 Minutes Intravenous Every 6 hours 01/20/23 1451 01/21/23 1159   01/20/23 0915  ceFAZolin (ANCEF) IVPB 2g/100 mL premix        2 g 200 mL/hr over 30 Minutes Intravenous On call to O.R. 01/20/23 [id number] 01/20/23 1134     .  She was given sequential compression devices, early ambulation, and eliquis for DVT prophylaxis.  She benefited maximally from the hospital stay and there were no complications.    Recent vital signs:  Vitals:   01/21/23 0549 01/21/23 0921  BP: 135/70 (!) 151/46  Pulse: (!) 55 (!) 58  Resp: 16 18  Temp: 98.4 F (36.9 C) 97.9 F (36.6 C)  SpO2: 100% 100%     Recent laboratory studies:  Lab Results  Component Value Date   HGB 9.2 (L) 01/21/2023   HGB 10.9 (L) 01/17/2023   HGB 11.2 (L) 01/08/2023   Lab Results  Component Value Date   WBC 5.2 01/21/2023   PLT 151 01/21/2023   No results found for: "INR" Lab Results  Component Value Date   NA 136 01/21/2023   K 4.5 01/21/2023   CL 103 01/21/2023   CO2 27 01/21/2023   BUN 28 (H) 01/21/2023   CREATININE 0.98 01/21/2023   GLUCOSE 141 (H) 01/21/2023    Discharge Medications:   Allergies as of 01/21/2023       Reactions   Amlodipine Besylate Swelling   Swelling in legs   Amoxicillin-pot Clavulanate Diarrhea   Versed [midazolam]    PT does not want any Versed due to memory loss potential         Medication List     STOP taking these medications    acetaminophen 650 MG CR tablet Commonly known as: TYLENOL Replaced by: acetaminophen 500 MG tablet   aspirin EC 81 MG tablet   cyclobenzaprine 5 MG tablet Commonly known as: FLEXERIL   diclofenac Sodium 1 % Gel Commonly known as: VOLTAREN   Sprycel 50 MG tablet Generic drug: dasatinib       TAKE these  medications    acetaminophen 500 MG tablet Commonly known as: TYLENOL Take 2 tablets (1,000 mg total) by mouth every 8 (eight) hours as needed. Replaces: acetaminophen 650 MG CR tablet   apixaban 2.5 MG Tabs tablet Commonly known as: Eliquis Take 1 tablet (2.5 mg total) by mouth 2 (two) times daily.   atorvastatin 20 MG tablet Commonly known as: LIPITOR Take 20 mg by mouth daily.   famotidine 20 MG tablet Commonly known as: PEPCID Take 20 mg by mouth 2 (two) times daily.   levothyroxine 125 MCG tablet Commonly known as: SYNTHROID Take 125 mcg by mouth daily before breakfast.   methocarbamol 500 MG tablet Commonly known as: ROBAXIN Take 1 tablet (500 mg total) by mouth every 8 (eight) hours as needed for up to 10 days for muscle spasms.   multivitamin with minerals Tabs tablet Take 1 tablet by mouth  daily.   ondansetron 8 MG tablet Commonly known as: ZOFRAN Take 1 tablet (8 mg total) by mouth every 8 (eight) hours as needed for nausea or vomiting. What changed: Another medication with the same name was added. Make sure you understand how and when to take each.   ondansetron 4 MG tablet Commonly known as: Zofran Take 1 tablet (4 mg total) by mouth every 8 (eight) hours as needed for up to 14 days for nausea or vomiting. What changed: You were already taking a medication with the same name, and this prescription was added. Make sure you understand how and when to take each.   oxyCODONE 5 MG immediate release tablet Commonly known as: Roxicodone Take 1 tablet (5 mg total) by mouth every 4 (four) hours as needed for up to 7 days for severe pain or moderate pain.   polyethylene glycol 17 g packet Commonly known as: MIRALAX / GLYCOLAX Take 17 g by mouth daily as needed for moderate constipation.   telmisartan-hydrochlorothiazide 80-25 MG tablet Commonly known as: MICARDIS HCT Take 1 tablet by mouth daily.   Vitamin B12 1000 MCG Tbcr Take 1,000 mcg by mouth 3 (three) times a week.   Vitamin D 125 MCG (5000 UT) Caps Take 5,000 Units by mouth daily.        Diagnostic Studies: DG Knee Right Port  Result Date: 01/20/2023 CLINICAL DATA:  Postoperative arthroplasty EXAM: PORTABLE RIGHT KNEE - 1-2 VIEW COMPARISON:  None Available. FINDINGS: Total knee arthroplasty. Prosthetic components are well seated. Expected soft tissue changes in the anterior knee. IMPRESSION: Total knee arthroplasty without complication. Electronically Signed   By: Suzy Bouchard M.D.   On: 01/20/2023 14:22    Disposition: Discharge disposition: 01-Home or Self Care       Discharge Instructions     Call MD / Call 911   Complete by: As directed    If you experience chest pain or shortness of breath, CALL 911 and be transported to the hospital emergency room.  If you develope a fever above 101 F, pus  (white drainage) or increased drainage or redness at the wound, or calf pain, call your surgeon's office.   Constipation Prevention   Complete by: As directed    Drink plenty of fluids.  Prune juice may be helpful.  You may use a stool softener, such as Colace (over the counter) 100 mg twice a day.  Use MiraLax (over the counter) for constipation as needed.   Diet - low sodium heart healthy   Complete by: As directed    Do not put a pillow under the  knee. Place it under the heel.   Complete by: As directed    Increase activity slowly as tolerated   Complete by: As directed    Post-operative opioid taper instructions:   Complete by: As directed    POST-OPERATIVE OPIOID TAPER INSTRUCTIONS: It is important to wean off of your opioid medication as soon as possible. If you do not need pain medication after your surgery it is ok to stop day one. Opioids include: Codeine, Hydrocodone(Norco, Vicodin), Oxycodone(Percocet, oxycontin) and hydromorphone amongst others.  Long term and even short term use of opiods can cause: Increased pain response Dependence Constipation Depression Respiratory depression And more.  Withdrawal symptoms can include Flu like symptoms Nausea, vomiting And more Techniques to manage these symptoms Hydrate well Eat regular healthy meals Stay active Use relaxation techniques(deep breathing, meditating, yoga) Do Not substitute Alcohol to help with tapering If you have been on opioids for less than two weeks and do not have pain than it is ok to stop all together.  Plan to wean off of opioids This plan should start within one week post op of your joint replacement. Maintain the same interval or time between taking each dose and first decrease the dose.  Cut the total daily intake of opioids by one tablet each day Next start to increase the time between doses. The last dose that should be eliminated is the evening dose.           Follow-up Information      Willaim Sheng, MD. Go on 02/05/2023.   Specialty: Orthopedic Surgery Why: Your appointment is scheduled fro 2:30 Contact information: [place] [address] [phone]         [facility] Follow up.   Specialty: [place] Why: HHPT will provide 6 home visits prior to starting outpatient phyiscal therapy Contact information: [address] [place] [address] [phone]         [facility], [place]. Go on 02/04/2023.   Why: Your outpatient phyiscal therapy is scheduled foro 1:45. Please arrive at 1:30 to complete your paperwork Contact information: [facility] [place] [address] [phone]                    Discharge Instructions      INSTRUCTIONS AFTER JOINT REPLACEMENT   Remove items at home which could result in a fall. This includes throw rugs or furniture in walking pathways ICE to the affected joint every three hours while awake for 30 minutes at a time, for at least the first 3-5 days, and then as needed for pain and swelling.  Continue to use ice for pain and swelling. You may notice swelling that will progress down to the foot and ankle.  This is normal after surgery.  Elevate your leg when you are not up walking on it.   Continue to use the breathing machine you got in the hospital (incentive spirometer) which will help keep your temperature down.  It is common for your temperature to cycle up and down following surgery, especially at night when you are not up moving around and exerting yourself.  The breathing machine keeps your lungs expanded and your temperature down.   DIET:  As you were doing prior to hospitalization, we recommend a well-balanced diet.  DRESSING / WOUND CARE / SHOWERING  Keep the surgical dressing until follow up.  The dressing is water proof, so  you can shower without any extra covering.  IF THE DRESSING  FALLS OFF or the wound gets wet inside, change the dressing with sterile gauze.  Please use good hand washing techniques before changing the dressing.  Do not use any lotions or creams on the incision until instructed by your surgeon.    ACTIVITY  Increase activity slowly as tolerated, but follow the weight bearing instructions below.   No driving for 6 weeks or until further direction given by your physician.  You cannot drive while taking narcotics.  No lifting or carrying greater than 10 lbs. until further directed by your surgeon. Avoid periods of inactivity such as sitting longer than an hour when not asleep. This helps prevent blood clots.  You may return to work once you are authorized by your doctor.     WEIGHT BEARING   Weight bearing as tolerated with assist device (walker, cane, etc) as directed, use it as long as suggested by your surgeon or therapist, typically at least 4-6 weeks.   EXERCISES  Results after joint replacement surgery are often greatly improved when you follow the exercise, range of motion and muscle strengthening exercises prescribed by your doctor. Safety measures are also important to protect the joint from further injury. Any time any of these exercises cause you to have increased pain or swelling, decrease what you are doing until you are comfortable again and then slowly increase them. If you have problems or questions, call your caregiver or physical therapist for advice.   Rehabilitation is important following a joint replacement. After just a few days of immobilization, the muscles of the leg can become weakened and shrink (atrophy).  These exercises are designed to build up the tone and strength of the thigh and leg muscles and to improve motion. Often times heat used for twenty to thirty minutes before working out will loosen up your tissues and help with improving the range of motion but do not use heat for the first two weeks following surgery (sometimes  heat can increase post-operative swelling).   These exercises can be done on a training (exercise) mat, on the floor, on a table or on a bed. Use whatever works the best and is most comfortable for you.    Use music or television while you are exercising so that the exercises are a pleasant break in your day. This will make your life better with the exercises acting as a break in your routine that you can look forward to.   Perform all exercises about fifteen times, three times per day or as directed.  You should exercise both the operative leg and the other leg as well.  Exercises include:   Quad Sets - Tighten up the muscle on the front of the thigh (Quad) and hold for 5-10 seconds.   Straight Leg Raises - With your knee straight (if you were given a brace, keep it on), lift the leg to 60 degrees, hold for 3 seconds, and slowly lower the leg.  Perform this exercise against resistance later as your leg gets stronger.  Leg Slides: Lying on your back, slowly slide your foot toward your buttocks, bending your knee up off the floor (only go as far as is comfortable). Then slowly slide your foot back down until your leg is flat on the floor again.  Angel Wings: Lying on your back spread your legs to the side as far apart as you can without causing discomfort.  Hamstring Strength:  Lying on  your back, push your heel against the floor with your leg straight by tightening up the muscles of your buttocks.  Repeat, but this time bend your knee to a comfortable angle, and push your heel against the floor.  You may put a pillow under the heel to make it more comfortable if necessary.   A rehabilitation program following joint replacement surgery can speed recovery and prevent re-injury in the future due to weakened muscles. Contact your doctor or a physical therapist for more information on knee rehabilitation.    CONSTIPATION  Constipation is defined medically as fewer than three stools per week and severe  constipation as less than one stool per week.  Even if you have a regular bowel pattern at home, your normal regimen is likely to be disrupted due to multiple reasons following surgery.  Combination of anesthesia, postoperative narcotics, change in appetite and fluid intake all can affect your bowels.   YOU MUST use at least one of the following options; they are listed in order of increasing strength to get the job done.  They are all available over the counter, and you may need to use some, POSSIBLY even all of these options:    Drink plenty of fluids (prune juice may be helpful) and high fiber foods Colace 100 mg by mouth twice a day  Senokot for constipation as directed and as needed Dulcolax (bisacodyl), take with full glass of water  Miralax (polyethylene glycol) once or twice a day as needed.  If you have tried all these things and are unable to have a bowel movement in the first 3-4 days after surgery call either your surgeon or your primary doctor.    If you experience loose stools or diarrhea, hold the medications until you stool forms back up.  If your symptoms do not get better within 1 week or if they get worse, check with your doctor.  If you experience "the worst abdominal pain ever" or develop nausea or vomiting, please contact the office immediately for further recommendations for treatment.   ITCHING:  If you experience itching with your medications, try taking only a single pain pill, or even half a pain pill at a time.  You can also use Benadryl over the counter for itching or also to help with sleep.   TED HOSE STOCKINGS:  Use stockings on both legs until for at least 2 weeks or as directed by physician office. They may be removed at night for sleeping.  MEDICATIONS:  See your medication summary on the "After Visit Summary" that nursing will review with you.  You may have some home medications which will be placed on hold until you complete the course of blood thinner  medication.  It is important for you to complete the blood thinner medication as prescribed.   Blood clot prevention (DVT Prophylaxis): After surgery you are at an increased risk for a blood clot. you were prescribed a blood thinner, eliquis, to be taken twice daily for a total of 4 weeks from surgery to help reduce your risk of getting a blood clot. This will help prevent a blood clot. Signs of a pulmonary embolus (blood clot in the lungs) include sudden short of breath, feeling lightheaded or dizzy, chest pain with a deep breath, rapid pulse rapid breathing. Signs of a blood clot in your arms or legs include new unexplained swelling and cramping, warm, red or darkened skin around the painful area. Please call the office or 911 right away if  these signs or symptoms develop.  PRECAUTIONS:  If you experience chest pain or shortness of breath - call 911 immediately for transfer to the hospital emergency department.   If you develop a fever greater that 101 F, purulent drainage from wound, increased redness or drainage from wound, foul odor from the wound/dressing, or calf pain - CONTACT YOUR SURGEON.                                                   FOLLOW-UP APPOINTMENTS:  If you do not already have a post-op appointment, please call the office for an appointment to be seen by your surgeon.  Guidelines for how soon to be seen are listed in your "After Visit Summary", but are typically between 2-3 weeks after surgery.  OTHER INSTRUCTIONS:   Knee Replacement:  Do not place pillow under knee, focus on keeping the knee straight while resting. CPM instructions: 0-90 degrees, 2 hours in the morning, 2 hours in the afternoon, and 2 hours in the evening. Place foam block, curve side up under heel at all times except when in CPM or when walking.   DO NOT modify, tear, cut, or change the foam block in any way.  POST-OPERATIVE OPIOID TAPER INSTRUCTIONS: It is important to wean off of your opioid medication as  soon as possible. If you do not need pain medication after your surgery it is ok to stop day one. Opioids include: Codeine, Hydrocodone(Norco, Vicodin), Oxycodone(Percocet, oxycontin) and hydromorphone amongst others.  Long term and even short term use of opiods can cause: Increased pain response Dependence Constipation Depression Respiratory depression And more.  Withdrawal symptoms can include Flu like symptoms Nausea, vomiting And more Techniques to manage these symptoms Hydrate well Eat regular healthy meals Stay active Use relaxation techniques(deep breathing, meditating, yoga) Do Not substitute Alcohol to help with tapering If you have been on opioids for less than two weeks and do not have pain than it is ok to stop all together.  Plan to wean off of opioids This plan should start within one week post op of your joint replacement. Maintain the same interval or time between taking each dose and first decrease the dose.  Cut the total daily intake of opioids by one tablet each day Next start to increase the time between doses. The last dose that should be eliminated is the evening dose.   MAKE SURE YOU:  Understand these instructions.  Get help right away if you are not doing well or get worse.    Thank you for letting us be a part of your medical care team.  It is a privilege we respect greatly.  We hope these instructions will help you stay on track for a fast and full recovery!    Information on my medicine - ELIQUIS (apixaban)  Why was Eliquis prescribed for you? Eliquis was prescribed for you to reduce the risk of blood clots forming after orthopedic surgery.    What do You need to know about Eliquis? Take your Eliquis TWICE DAILY - one tablet in the morning and one tablet in the evening with or without food.  It would be best to take the dose about the same time each day.  If you have difficulty swallowing the tablet whole please discuss with your  pharmacist how to take the medication safely.  Take Eliquis exactly as prescribed by your doctor and DO NOT stop taking Eliquis without talking to the doctor who prescribed the medication.  Stopping without other medication to take the place of Eliquis may increase your risk of developing a clot.  After discharge, you should have regular check-up appointments with your healthcare provider that is prescribing your Eliquis.  What do you do if you miss a dose? If a dose of ELIQUIS is not taken at the scheduled time, take it as soon as possible on the same day and twice-daily administration should be resumed.  The dose should not be doubled to make up for a missed dose.  Do not take more than one tablet of ELIQUIS at the same time.  Important Safety Information A possible side effect of Eliquis is bleeding. You should call your healthcare provider right away if you experience any of the following: Bleeding from an injury or your nose that does not stop. Unusual colored urine (red or dark brown) or unusual colored stools (red or black). Unusual bruising for unknown reasons. A serious fall or if you hit your head (even if there is no bleeding).  Some medicines may interact with Eliquis and might increase your risk of bleeding or clotting while on Eliquis. To help avoid this, consult your healthcare provider or pharmacist prior to using any new prescription or non-prescription medications, including herbals, vitamins, non-steroidal anti-inflammatory drugs (NSAIDs) and supplements.  This website has more information on Eliquis (apixaban): http://www.eliquis.com/eliquis/home        Signed: Daivd Fredericksen A Jacqueli Pangallo 01/21/2023, 5:05 PM

## 2023-01-21 NOTE — Progress Notes (Signed)
     Subjective:  Patient reports pain as well-controlled.  Denies distal numbness and tingling.  Had issues with nausea yesterday.  Doing much better this morning.  Eager to work with physical therapy and plan for possible discharge home later today.  Objective:   VITALS:   Vitals:   01/20/23 1749 01/20/23 2204 01/21/23 0151 01/21/23 0549  BP: (!) 133/47 (!) 159/53 (!) 138/50 135/70  Pulse: (!) 58 63 84 (!) 55  Resp: 16  16 16  $ Temp: 97.6 F (36.4 C) (!) 97.5 F (36.4 C) 97.8 F (36.6 C) 98.4 F (36.9 C)  TempSrc:  Oral  Oral  SpO2: 96% 100% 99% 100%  Weight:      Height:        Sensation intact distally Intact pulses distally Dorsiflexion/Plantar flexion intact Incision: dressing C/D/I Compartment soft   Lab Results  Component Value Date   WBC 5.2 01/21/2023   HGB 9.2 (L) 01/21/2023   HCT 28.2 (L) 01/21/2023   MCV 97.2 01/21/2023   PLT 151 01/21/2023   BMET    Component Value Date/Time   NA 136 01/21/2023 0315   K 4.5 01/21/2023 0315   CL 103 01/21/2023 0315   CO2 27 01/21/2023 0315   GLUCOSE 141 (H) 01/21/2023 0315   BUN 28 (H) 01/21/2023 0315   CREATININE 0.98 01/21/2023 0315   CREATININE 1.10 (H) 01/17/2023 1015   CALCIUM 8.6 (L) 01/21/2023 0315   GFRNONAA 58 (L) 01/21/2023 0315   GFRNONAA 50 (L) 01/17/2023 1015      Xray: post op x-rays demonstrate total knee arthroplasty components in good position no adverse features  Assessment/Plan: 1 Day Post-Op   Principal Problem:   Localized osteoarthritis of right knee  S/p R TKA 01/20/23  Post op recs: WB: WBAT Abx: ancef Imaging: PACU xrays DVT prophylaxis: Eliquis 2.5 mg twice daily given history of cancer. Follow up: 2 weeks after surgery for a wound check with Dr. Zachery Dakins at [facility].  Address: [address] [place], [place], [place] [address]  Office Phone: [phone]   Willaim Sheng 01/21/2023, 6:28 AM   Charlies Constable, MD  Contact information:    [phone] 7am-5pm epic message Dr. Zachery Dakins, or call office for patient follow up: [phone] After hours and holidays please check [email or web address] for group call information for [facility]

## 2023-01-21 NOTE — Evaluation (Signed)
Physical Therapy Evaluation Patient Details Name: Jodi Woods MRN: [id number] DOB: 03/20/1941 Today's Date: 01/21/2023  History of Present Illness  82 yo female s/p R TKA on 01/20/23. PMH: CML, HTN, anemia  Clinical Impression  Pt is s/p TKA resulting in the deficits listed below (see PT Problem List).  Pt doing well, anticipate steady progress in acute setting. Pt will likely d/c later today  Pt will benefit from skilled PT to increase their independence and safety with mobility to allow discharge to the venue listed below.         Recommendations for follow up therapy are one component of a multi-disciplinary discharge planning process, led by the attending physician.  Recommendations may be updated based on patient status, additional functional criteria and insurance authorization.  Follow Up Recommendations Follow physician's recommendations for discharge plan and follow up therapies      Assistance Recommended at Discharge Intermittent Supervision/Assistance  Patient can return home with the following  Assistance with cooking/housework;Assist for transportation;Help with stairs or ramp for entrance    Equipment Recommendations None recommended by PT  Recommendations for Other Services       Functional Status Assessment Patient has had a recent decline in their functional status and demonstrates the ability to make significant improvements in function in a reasonable and predictable amount of time.     Precautions / Restrictions Precautions Precautions: Knee;Fall      Mobility  Bed Mobility               General bed mobility comments: in recliner    Transfers Overall transfer level: Needs assistance Equipment used: Rolling walker (2 wheels) Transfers: Sit to/from Stand Sit to Stand: Min guard, Supervision           General transfer comment: cues for hand placement    Ambulation/Gait Ambulation/Gait assistance: Min guard, Supervision Gait Distance  (Feet): 150 Feet Assistive device: Rolling walker (2 wheels) Gait Pattern/deviations: Step-to pattern, Step-through pattern       General Gait Details: cues for RW position, position to step through  pattern with good stability and improved wt shift to RLE with incr distance  Stairs            Wheelchair Mobility    Modified Rankin (Stroke Patients Only)       Balance                                             Pertinent Vitals/Pain Pain Assessment Pain Assessment: 0-10 Pain Location: right knee Pain Descriptors / Indicators: Aching, Sore Pain Intervention(s): Limited activity within patient's tolerance, Monitored during session, Premedicated before session, Repositioned    Home Living Family/patient expects to be discharged to:: Private residence Living Arrangements: Alone Available Help at Discharge: Family Type of Home: House Home Access: Ramped entrance       Home Layout: One level Home Equipment: Conservation officer, nature (2 wheels);BSC/3in1;Toilet riser      Prior Function Prior Level of Function : Independent/Modified Independent                     Hand Dominance        Extremity/Trunk Assessment   Upper Extremity Assessment Upper Extremity Assessment: Defer to OT evaluation    Lower Extremity Assessment Lower Extremity Assessment: RLE deficits/detail RLE Deficits / Details: ankle WFL, knee and hip grossly 2+ to 3/5  Communication   Communication: No difficulties  Cognition Arousal/Alertness: Awake/alert Behavior During Therapy: WFL for tasks assessed/performed Overall Cognitive Status: Within Functional Limits for tasks assessed                                          General Comments      Exercises Total Joint Exercises Ankle Circles/Pumps: AROM, Both, 10 reps Quad Sets: AROM, Both, 10 reps Heel Slides: AROM, AAROM, 10 reps, Right Hip ABduction/ADduction: AROM, Right, 10 reps Straight  Leg Raises: AROM, AAROM, Right, 15 reps   Assessment/Plan    PT Assessment Patient needs continued PT services  PT Problem List Decreased strength;Decreased range of motion;Decreased activity tolerance;Decreased mobility;Decreased knowledge of precautions;Decreased knowledge of use of DME       PT Treatment Interventions DME instruction;Gait training;Functional mobility training;Therapeutic activities;Patient/family education;Therapeutic exercise    PT Goals (Current goals can be found in the Care Plan section)  Acute Rehab PT Goals PT Goal Formulation: With patient Time For Goal Achievement: 01/28/23 Potential to Achieve Goals: Good    Frequency 7X/week     Co-evaluation               AM-PAC PT "6 Clicks" Mobility  Outcome Measure Help needed turning from your back to your side while in a flat bed without using bedrails?: None Help needed moving from lying on your back to sitting on the side of a flat bed without using bedrails?: None Help needed moving to and from a bed to a chair (including a wheelchair)?: None Help needed standing up from a chair using your arms (e.g., wheelchair or bedside chair)?: A Little Help needed to walk in hospital room?: A Little Help needed climbing 3-5 steps with a railing? : A Little 6 Click Score: 21    End of Session Equipment Utilized During Treatment: Gait belt Activity Tolerance: Patient tolerated treatment well Patient left: in chair;with call bell/phone within reach;with chair alarm set Nurse Communication: Mobility status PT Visit Diagnosis: Other abnormalities of gait and mobility (R26.89);Difficulty in walking, not elsewhere classified (R26.2)    Time: [id number] PT Time Calculation (min) (ACUTE ONLY): 22 min   Charges:   PT Evaluation $PT Eval Low Complexity: [place], PT  Acute Rehab Dept [facility]) [phone]  [facility] Weekend Pager [facility] only)   [phone]  01/21/2023   [facility] 01/21/2023, 1:12 PM

## 2023-01-21 NOTE — Plan of Care (Signed)
Plan of care reviewed and discussed. 

## 2023-01-21 NOTE — TOC Transition Note (Signed)
Transition of Care [facility]) - CM/SW Discharge Note   Patient Details  Name: Jodi Woods MRN: [id number] Date of Birth: May 26, 1941  Transition of Care [facility]) CM/SW Contact:  Lennart Pall, LCSW Phone Number: 01/21/2023, 9:50 AM   Clinical Narrative:     Met with pt and confirming she has needed DME at home.  HHPT prearranged with [facility] via MD office.  No TOC needs.  Final next level of care: [place] Barriers to Discharge: No Barriers Identified   Patient Goals and CMS Choice      Discharge Placement                         Discharge Plan and Services Additional resources added to the After Visit Summary for                  DME Arranged: [facility] D[facility] Agency: [facility]       HH Arranged: PT [place] Agency: [place]        Social Determinants of Health (SDOH) Interventions SDOH Screenings   Food Insecurity: No Food Insecurity (01/20/2023)  Housing: Low Risk  (01/20/2023)  Transportation Needs: No Transportation Needs (01/20/2023)  Utilities: Not At Risk (01/20/2023)  Tobacco Use: Low Risk  (01/20/2023)     Readmission Risk Interventions     No data to display

## 2023-01-21 NOTE — Progress Notes (Signed)
Patient discharged to home w/ family. Given all belongings, instructions. Verbalized understanding of all instructions. Escorted to pov via w/c. 

## 2023-01-22 ENCOUNTER — Encounter (HOSPITAL_COMMUNITY): Payer: Self-pay | Admitting: Orthopedic Surgery

## 2023-01-22 DIAGNOSIS — I10 Essential (primary) hypertension: Secondary | ICD-10-CM | POA: Diagnosis not present

## 2023-01-22 DIAGNOSIS — Z471 Aftercare following joint replacement surgery: Secondary | ICD-10-CM | POA: Diagnosis not present

## 2023-01-22 DIAGNOSIS — Z96651 Presence of right artificial knee joint: Secondary | ICD-10-CM | POA: Diagnosis not present

## 2023-01-22 DIAGNOSIS — C9210 Chronic myeloid leukemia, BCR/ABL-positive, not having achieved remission: Secondary | ICD-10-CM | POA: Diagnosis not present

## 2023-01-22 DIAGNOSIS — E7800 Pure hypercholesterolemia, unspecified: Secondary | ICD-10-CM | POA: Diagnosis not present

## 2023-01-22 DIAGNOSIS — Z9181 History of falling: Secondary | ICD-10-CM | POA: Diagnosis not present

## 2023-01-22 DIAGNOSIS — M1612 Unilateral primary osteoarthritis, left hip: Secondary | ICD-10-CM | POA: Diagnosis not present

## 2023-01-22 DIAGNOSIS — D630 Anemia in neoplastic disease: Secondary | ICD-10-CM | POA: Diagnosis not present

## 2023-01-22 DIAGNOSIS — G43909 Migraine, unspecified, not intractable, without status migrainosus: Secondary | ICD-10-CM | POA: Diagnosis not present

## 2023-01-22 DIAGNOSIS — E039 Hypothyroidism, unspecified: Secondary | ICD-10-CM | POA: Diagnosis not present

## 2023-01-22 DIAGNOSIS — M5416 Radiculopathy, lumbar region: Secondary | ICD-10-CM | POA: Diagnosis not present

## 2023-01-22 DIAGNOSIS — Z7901 Long term (current) use of anticoagulants: Secondary | ICD-10-CM | POA: Diagnosis not present

## 2023-01-22 DIAGNOSIS — H903 Sensorineural hearing loss, bilateral: Secondary | ICD-10-CM | POA: Diagnosis not present

## 2023-01-22 DIAGNOSIS — D500 Iron deficiency anemia secondary to blood loss (chronic): Secondary | ICD-10-CM | POA: Diagnosis not present

## 2023-01-22 LAB — BCR/ABL

## 2023-01-24 DIAGNOSIS — E7800 Pure hypercholesterolemia, unspecified: Secondary | ICD-10-CM | POA: Diagnosis not present

## 2023-01-24 DIAGNOSIS — Z7901 Long term (current) use of anticoagulants: Secondary | ICD-10-CM | POA: Diagnosis not present

## 2023-01-24 DIAGNOSIS — Z471 Aftercare following joint replacement surgery: Secondary | ICD-10-CM | POA: Diagnosis not present

## 2023-01-24 DIAGNOSIS — E039 Hypothyroidism, unspecified: Secondary | ICD-10-CM | POA: Diagnosis not present

## 2023-01-24 DIAGNOSIS — Z96651 Presence of right artificial knee joint: Secondary | ICD-10-CM | POA: Diagnosis not present

## 2023-01-24 DIAGNOSIS — G43909 Migraine, unspecified, not intractable, without status migrainosus: Secondary | ICD-10-CM | POA: Diagnosis not present

## 2023-01-24 DIAGNOSIS — M5416 Radiculopathy, lumbar region: Secondary | ICD-10-CM | POA: Diagnosis not present

## 2023-01-24 DIAGNOSIS — H903 Sensorineural hearing loss, bilateral: Secondary | ICD-10-CM | POA: Diagnosis not present

## 2023-01-24 DIAGNOSIS — C9210 Chronic myeloid leukemia, BCR/ABL-positive, not having achieved remission: Secondary | ICD-10-CM | POA: Diagnosis not present

## 2023-01-24 DIAGNOSIS — M1612 Unilateral primary osteoarthritis, left hip: Secondary | ICD-10-CM | POA: Diagnosis not present

## 2023-01-24 DIAGNOSIS — Z9181 History of falling: Secondary | ICD-10-CM | POA: Diagnosis not present

## 2023-01-24 DIAGNOSIS — D630 Anemia in neoplastic disease: Secondary | ICD-10-CM | POA: Diagnosis not present

## 2023-01-24 DIAGNOSIS — D500 Iron deficiency anemia secondary to blood loss (chronic): Secondary | ICD-10-CM | POA: Diagnosis not present

## 2023-01-24 DIAGNOSIS — I10 Essential (primary) hypertension: Secondary | ICD-10-CM | POA: Diagnosis not present

## 2023-01-27 DIAGNOSIS — I10 Essential (primary) hypertension: Secondary | ICD-10-CM | POA: Diagnosis not present

## 2023-01-27 DIAGNOSIS — D500 Iron deficiency anemia secondary to blood loss (chronic): Secondary | ICD-10-CM | POA: Diagnosis not present

## 2023-01-27 DIAGNOSIS — Z96651 Presence of right artificial knee joint: Secondary | ICD-10-CM | POA: Diagnosis not present

## 2023-01-27 DIAGNOSIS — Z9181 History of falling: Secondary | ICD-10-CM | POA: Diagnosis not present

## 2023-01-27 DIAGNOSIS — E7800 Pure hypercholesterolemia, unspecified: Secondary | ICD-10-CM | POA: Diagnosis not present

## 2023-01-27 DIAGNOSIS — Z7901 Long term (current) use of anticoagulants: Secondary | ICD-10-CM | POA: Diagnosis not present

## 2023-01-27 DIAGNOSIS — G43909 Migraine, unspecified, not intractable, without status migrainosus: Secondary | ICD-10-CM | POA: Diagnosis not present

## 2023-01-27 DIAGNOSIS — E039 Hypothyroidism, unspecified: Secondary | ICD-10-CM | POA: Diagnosis not present

## 2023-01-27 DIAGNOSIS — Z471 Aftercare following joint replacement surgery: Secondary | ICD-10-CM | POA: Diagnosis not present

## 2023-01-27 DIAGNOSIS — D630 Anemia in neoplastic disease: Secondary | ICD-10-CM | POA: Diagnosis not present

## 2023-01-27 DIAGNOSIS — C9210 Chronic myeloid leukemia, BCR/ABL-positive, not having achieved remission: Secondary | ICD-10-CM | POA: Diagnosis not present

## 2023-01-27 DIAGNOSIS — H903 Sensorineural hearing loss, bilateral: Secondary | ICD-10-CM | POA: Diagnosis not present

## 2023-01-27 DIAGNOSIS — M5416 Radiculopathy, lumbar region: Secondary | ICD-10-CM | POA: Diagnosis not present

## 2023-01-27 DIAGNOSIS — M1612 Unilateral primary osteoarthritis, left hip: Secondary | ICD-10-CM | POA: Diagnosis not present

## 2023-01-29 DIAGNOSIS — H903 Sensorineural hearing loss, bilateral: Secondary | ICD-10-CM | POA: Diagnosis not present

## 2023-01-29 DIAGNOSIS — G43909 Migraine, unspecified, not intractable, without status migrainosus: Secondary | ICD-10-CM | POA: Diagnosis not present

## 2023-01-29 DIAGNOSIS — D500 Iron deficiency anemia secondary to blood loss (chronic): Secondary | ICD-10-CM | POA: Diagnosis not present

## 2023-01-29 DIAGNOSIS — Z471 Aftercare following joint replacement surgery: Secondary | ICD-10-CM | POA: Diagnosis not present

## 2023-01-29 DIAGNOSIS — M5416 Radiculopathy, lumbar region: Secondary | ICD-10-CM | POA: Diagnosis not present

## 2023-01-29 DIAGNOSIS — Z96651 Presence of right artificial knee joint: Secondary | ICD-10-CM | POA: Diagnosis not present

## 2023-01-29 DIAGNOSIS — I10 Essential (primary) hypertension: Secondary | ICD-10-CM | POA: Diagnosis not present

## 2023-01-29 DIAGNOSIS — E7800 Pure hypercholesterolemia, unspecified: Secondary | ICD-10-CM | POA: Diagnosis not present

## 2023-01-29 DIAGNOSIS — D630 Anemia in neoplastic disease: Secondary | ICD-10-CM | POA: Diagnosis not present

## 2023-01-29 DIAGNOSIS — M1612 Unilateral primary osteoarthritis, left hip: Secondary | ICD-10-CM | POA: Diagnosis not present

## 2023-01-29 DIAGNOSIS — Z9181 History of falling: Secondary | ICD-10-CM | POA: Diagnosis not present

## 2023-01-29 DIAGNOSIS — C9210 Chronic myeloid leukemia, BCR/ABL-positive, not having achieved remission: Secondary | ICD-10-CM | POA: Diagnosis not present

## 2023-01-29 DIAGNOSIS — Z7901 Long term (current) use of anticoagulants: Secondary | ICD-10-CM | POA: Diagnosis not present

## 2023-01-29 DIAGNOSIS — E039 Hypothyroidism, unspecified: Secondary | ICD-10-CM | POA: Diagnosis not present

## 2023-01-30 ENCOUNTER — Ambulatory Visit: Payer: BC Managed Care – PPO | Admitting: Hematology & Oncology

## 2023-01-30 ENCOUNTER — Other Ambulatory Visit: Payer: BC Managed Care – PPO

## 2023-01-30 DIAGNOSIS — R32 Unspecified urinary incontinence: Secondary | ICD-10-CM | POA: Diagnosis not present

## 2023-01-30 DIAGNOSIS — M179 Osteoarthritis of knee, unspecified: Secondary | ICD-10-CM | POA: Diagnosis not present

## 2023-01-30 DIAGNOSIS — I10 Essential (primary) hypertension: Secondary | ICD-10-CM | POA: Diagnosis not present

## 2023-01-30 DIAGNOSIS — M542 Cervicalgia: Secondary | ICD-10-CM | POA: Diagnosis not present

## 2023-01-31 DIAGNOSIS — Z471 Aftercare following joint replacement surgery: Secondary | ICD-10-CM | POA: Diagnosis not present

## 2023-01-31 DIAGNOSIS — D500 Iron deficiency anemia secondary to blood loss (chronic): Secondary | ICD-10-CM | POA: Diagnosis not present

## 2023-01-31 DIAGNOSIS — C9210 Chronic myeloid leukemia, BCR/ABL-positive, not having achieved remission: Secondary | ICD-10-CM | POA: Diagnosis not present

## 2023-01-31 DIAGNOSIS — M1612 Unilateral primary osteoarthritis, left hip: Secondary | ICD-10-CM | POA: Diagnosis not present

## 2023-01-31 DIAGNOSIS — H903 Sensorineural hearing loss, bilateral: Secondary | ICD-10-CM | POA: Diagnosis not present

## 2023-01-31 DIAGNOSIS — G43909 Migraine, unspecified, not intractable, without status migrainosus: Secondary | ICD-10-CM | POA: Diagnosis not present

## 2023-01-31 DIAGNOSIS — D630 Anemia in neoplastic disease: Secondary | ICD-10-CM | POA: Diagnosis not present

## 2023-01-31 DIAGNOSIS — M5416 Radiculopathy, lumbar region: Secondary | ICD-10-CM | POA: Diagnosis not present

## 2023-01-31 DIAGNOSIS — E039 Hypothyroidism, unspecified: Secondary | ICD-10-CM | POA: Diagnosis not present

## 2023-01-31 DIAGNOSIS — Z9181 History of falling: Secondary | ICD-10-CM | POA: Diagnosis not present

## 2023-01-31 DIAGNOSIS — Z96651 Presence of right artificial knee joint: Secondary | ICD-10-CM | POA: Diagnosis not present

## 2023-01-31 DIAGNOSIS — Z7901 Long term (current) use of anticoagulants: Secondary | ICD-10-CM | POA: Diagnosis not present

## 2023-01-31 DIAGNOSIS — E7800 Pure hypercholesterolemia, unspecified: Secondary | ICD-10-CM | POA: Diagnosis not present

## 2023-01-31 DIAGNOSIS — I10 Essential (primary) hypertension: Secondary | ICD-10-CM | POA: Diagnosis not present

## 2023-02-03 DIAGNOSIS — M1612 Unilateral primary osteoarthritis, left hip: Secondary | ICD-10-CM | POA: Diagnosis not present

## 2023-02-03 DIAGNOSIS — E039 Hypothyroidism, unspecified: Secondary | ICD-10-CM | POA: Diagnosis not present

## 2023-02-03 DIAGNOSIS — G43909 Migraine, unspecified, not intractable, without status migrainosus: Secondary | ICD-10-CM | POA: Diagnosis not present

## 2023-02-03 DIAGNOSIS — Z9181 History of falling: Secondary | ICD-10-CM | POA: Diagnosis not present

## 2023-02-03 DIAGNOSIS — E7800 Pure hypercholesterolemia, unspecified: Secondary | ICD-10-CM | POA: Diagnosis not present

## 2023-02-03 DIAGNOSIS — H903 Sensorineural hearing loss, bilateral: Secondary | ICD-10-CM | POA: Diagnosis not present

## 2023-02-03 DIAGNOSIS — Z96651 Presence of right artificial knee joint: Secondary | ICD-10-CM | POA: Diagnosis not present

## 2023-02-03 DIAGNOSIS — C9210 Chronic myeloid leukemia, BCR/ABL-positive, not having achieved remission: Secondary | ICD-10-CM | POA: Diagnosis not present

## 2023-02-03 DIAGNOSIS — Z471 Aftercare following joint replacement surgery: Secondary | ICD-10-CM | POA: Diagnosis not present

## 2023-02-03 DIAGNOSIS — Z7901 Long term (current) use of anticoagulants: Secondary | ICD-10-CM | POA: Diagnosis not present

## 2023-02-03 DIAGNOSIS — I10 Essential (primary) hypertension: Secondary | ICD-10-CM | POA: Diagnosis not present

## 2023-02-03 DIAGNOSIS — D500 Iron deficiency anemia secondary to blood loss (chronic): Secondary | ICD-10-CM | POA: Diagnosis not present

## 2023-02-03 DIAGNOSIS — D630 Anemia in neoplastic disease: Secondary | ICD-10-CM | POA: Diagnosis not present

## 2023-02-03 DIAGNOSIS — M5416 Radiculopathy, lumbar region: Secondary | ICD-10-CM | POA: Diagnosis not present

## 2023-02-04 DIAGNOSIS — Z471 Aftercare following joint replacement surgery: Secondary | ICD-10-CM | POA: Diagnosis not present

## 2023-02-04 DIAGNOSIS — M6281 Muscle weakness (generalized): Secondary | ICD-10-CM | POA: Diagnosis not present

## 2023-02-04 DIAGNOSIS — M1711 Unilateral primary osteoarthritis, right knee: Secondary | ICD-10-CM | POA: Diagnosis not present

## 2023-02-04 DIAGNOSIS — Z96651 Presence of right artificial knee joint: Secondary | ICD-10-CM | POA: Diagnosis not present

## 2023-02-04 DIAGNOSIS — R269 Unspecified abnormalities of gait and mobility: Secondary | ICD-10-CM | POA: Diagnosis not present

## 2023-02-06 DIAGNOSIS — H4720 Unspecified optic atrophy: Secondary | ICD-10-CM | POA: Diagnosis not present

## 2023-02-06 DIAGNOSIS — Z742 Need for assistance at home and no other household member able to render care: Secondary | ICD-10-CM | POA: Diagnosis not present

## 2023-02-06 DIAGNOSIS — H524 Presbyopia: Secondary | ICD-10-CM | POA: Diagnosis not present

## 2023-02-06 DIAGNOSIS — Z961 Presence of intraocular lens: Secondary | ICD-10-CM | POA: Diagnosis not present

## 2023-02-06 DIAGNOSIS — M179 Osteoarthritis of knee, unspecified: Secondary | ICD-10-CM | POA: Diagnosis not present

## 2023-02-06 DIAGNOSIS — I10 Essential (primary) hypertension: Secondary | ICD-10-CM | POA: Diagnosis not present

## 2023-02-06 DIAGNOSIS — H349 Unspecified retinal vascular occlusion: Secondary | ICD-10-CM | POA: Diagnosis not present

## 2023-02-06 DIAGNOSIS — H348312 Tributary (branch) retinal vein occlusion, right eye, stable: Secondary | ICD-10-CM | POA: Diagnosis not present

## 2023-02-07 ENCOUNTER — Telehealth: Payer: Self-pay

## 2023-02-07 DIAGNOSIS — Z471 Aftercare following joint replacement surgery: Secondary | ICD-10-CM | POA: Diagnosis not present

## 2023-02-07 DIAGNOSIS — M6281 Muscle weakness (generalized): Secondary | ICD-10-CM | POA: Diagnosis not present

## 2023-02-07 DIAGNOSIS — R269 Unspecified abnormalities of gait and mobility: Secondary | ICD-10-CM | POA: Diagnosis not present

## 2023-02-07 DIAGNOSIS — Z96651 Presence of right artificial knee joint: Secondary | ICD-10-CM | POA: Diagnosis not present

## 2023-02-07 NOTE — Telephone Encounter (Signed)
Received phone call from patient asking if she is able to take her Sprycel and oxycodone for pain from her knee replacement surgery. This RN spoke with Dr. Marin Olp and per Dr. Marin Olp she is ok to take pain medicine while taking Sprycel. Pt educated on side effects of oxycodone with understanding verbalized and had no further questions.

## 2023-02-10 DIAGNOSIS — R109 Unspecified abdominal pain: Secondary | ICD-10-CM | POA: Diagnosis not present

## 2023-02-10 DIAGNOSIS — R110 Nausea: Secondary | ICD-10-CM | POA: Diagnosis not present

## 2023-02-10 DIAGNOSIS — K219 Gastro-esophageal reflux disease without esophagitis: Secondary | ICD-10-CM | POA: Diagnosis not present

## 2023-02-10 DIAGNOSIS — I10 Essential (primary) hypertension: Secondary | ICD-10-CM | POA: Diagnosis not present

## 2023-02-11 ENCOUNTER — Other Ambulatory Visit (HOSPITAL_COMMUNITY): Payer: Self-pay

## 2023-02-11 ENCOUNTER — Other Ambulatory Visit: Payer: Self-pay | Admitting: Hematology & Oncology

## 2023-02-11 ENCOUNTER — Other Ambulatory Visit: Payer: Self-pay

## 2023-02-11 DIAGNOSIS — C9210 Chronic myeloid leukemia, BCR/ABL-positive, not having achieved remission: Secondary | ICD-10-CM

## 2023-02-11 MED ORDER — DASATINIB 50 MG PO TABS
ORAL_TABLET | ORAL | 8 refills | Status: DC
  Filled 2023-02-11: qty 15, 30d supply, fill #0
  Filled 2023-03-20: qty 15, 30d supply, fill #1
  Filled 2023-04-28: qty 15, 30d supply, fill #2
  Filled 2023-05-22: qty 15, 30d supply, fill #3
  Filled 2023-06-18: qty 15, 30d supply, fill #4
  Filled 2023-07-22: qty 15, 30d supply, fill #5
  Filled 2023-08-26: qty 15, 30d supply, fill #6

## 2023-02-12 DIAGNOSIS — Z471 Aftercare following joint replacement surgery: Secondary | ICD-10-CM | POA: Diagnosis not present

## 2023-02-12 DIAGNOSIS — Z96651 Presence of right artificial knee joint: Secondary | ICD-10-CM | POA: Diagnosis not present

## 2023-02-12 DIAGNOSIS — M6281 Muscle weakness (generalized): Secondary | ICD-10-CM | POA: Diagnosis not present

## 2023-02-12 DIAGNOSIS — R269 Unspecified abnormalities of gait and mobility: Secondary | ICD-10-CM | POA: Diagnosis not present

## 2023-02-13 ENCOUNTER — Other Ambulatory Visit (HOSPITAL_COMMUNITY): Payer: Self-pay

## 2023-02-13 DIAGNOSIS — K5900 Constipation, unspecified: Secondary | ICD-10-CM | POA: Diagnosis not present

## 2023-02-13 DIAGNOSIS — R5383 Other fatigue: Secondary | ICD-10-CM | POA: Diagnosis not present

## 2023-02-13 DIAGNOSIS — R110 Nausea: Secondary | ICD-10-CM | POA: Diagnosis not present

## 2023-02-13 DIAGNOSIS — Z96659 Presence of unspecified artificial knee joint: Secondary | ICD-10-CM | POA: Diagnosis not present

## 2023-02-14 DIAGNOSIS — Z471 Aftercare following joint replacement surgery: Secondary | ICD-10-CM | POA: Diagnosis not present

## 2023-02-14 DIAGNOSIS — M6281 Muscle weakness (generalized): Secondary | ICD-10-CM | POA: Diagnosis not present

## 2023-02-14 DIAGNOSIS — R269 Unspecified abnormalities of gait and mobility: Secondary | ICD-10-CM | POA: Diagnosis not present

## 2023-02-14 DIAGNOSIS — Z96651 Presence of right artificial knee joint: Secondary | ICD-10-CM | POA: Diagnosis not present

## 2023-02-17 DIAGNOSIS — R269 Unspecified abnormalities of gait and mobility: Secondary | ICD-10-CM | POA: Diagnosis not present

## 2023-02-17 DIAGNOSIS — Z96651 Presence of right artificial knee joint: Secondary | ICD-10-CM | POA: Diagnosis not present

## 2023-02-17 DIAGNOSIS — Z471 Aftercare following joint replacement surgery: Secondary | ICD-10-CM | POA: Diagnosis not present

## 2023-02-17 DIAGNOSIS — M6281 Muscle weakness (generalized): Secondary | ICD-10-CM | POA: Diagnosis not present

## 2023-02-25 DIAGNOSIS — Z96651 Presence of right artificial knee joint: Secondary | ICD-10-CM | POA: Diagnosis not present

## 2023-02-25 DIAGNOSIS — R269 Unspecified abnormalities of gait and mobility: Secondary | ICD-10-CM | POA: Diagnosis not present

## 2023-02-25 DIAGNOSIS — R110 Nausea: Secondary | ICD-10-CM | POA: Diagnosis not present

## 2023-02-25 DIAGNOSIS — M6281 Muscle weakness (generalized): Secondary | ICD-10-CM | POA: Diagnosis not present

## 2023-02-25 DIAGNOSIS — Z471 Aftercare following joint replacement surgery: Secondary | ICD-10-CM | POA: Diagnosis not present

## 2023-02-27 DIAGNOSIS — Z96651 Presence of right artificial knee joint: Secondary | ICD-10-CM | POA: Diagnosis not present

## 2023-02-27 DIAGNOSIS — R269 Unspecified abnormalities of gait and mobility: Secondary | ICD-10-CM | POA: Diagnosis not present

## 2023-02-27 DIAGNOSIS — M6281 Muscle weakness (generalized): Secondary | ICD-10-CM | POA: Diagnosis not present

## 2023-02-27 DIAGNOSIS — Z471 Aftercare following joint replacement surgery: Secondary | ICD-10-CM | POA: Diagnosis not present

## 2023-03-03 DIAGNOSIS — R269 Unspecified abnormalities of gait and mobility: Secondary | ICD-10-CM | POA: Diagnosis not present

## 2023-03-03 DIAGNOSIS — Z96651 Presence of right artificial knee joint: Secondary | ICD-10-CM | POA: Diagnosis not present

## 2023-03-03 DIAGNOSIS — M6281 Muscle weakness (generalized): Secondary | ICD-10-CM | POA: Diagnosis not present

## 2023-03-03 DIAGNOSIS — Z471 Aftercare following joint replacement surgery: Secondary | ICD-10-CM | POA: Diagnosis not present

## 2023-03-04 DIAGNOSIS — M1711 Unilateral primary osteoarthritis, right knee: Secondary | ICD-10-CM | POA: Diagnosis not present

## 2023-03-06 DIAGNOSIS — R110 Nausea: Secondary | ICD-10-CM | POA: Diagnosis not present

## 2023-03-06 DIAGNOSIS — I10 Essential (primary) hypertension: Secondary | ICD-10-CM | POA: Diagnosis not present

## 2023-03-06 DIAGNOSIS — M179 Osteoarthritis of knee, unspecified: Secondary | ICD-10-CM | POA: Diagnosis not present

## 2023-03-11 DIAGNOSIS — Z96651 Presence of right artificial knee joint: Secondary | ICD-10-CM | POA: Diagnosis not present

## 2023-03-11 DIAGNOSIS — Z471 Aftercare following joint replacement surgery: Secondary | ICD-10-CM | POA: Diagnosis not present

## 2023-03-11 DIAGNOSIS — M6281 Muscle weakness (generalized): Secondary | ICD-10-CM | POA: Diagnosis not present

## 2023-03-11 DIAGNOSIS — R269 Unspecified abnormalities of gait and mobility: Secondary | ICD-10-CM | POA: Diagnosis not present

## 2023-03-17 DIAGNOSIS — H34231 Retinal artery branch occlusion, right eye: Secondary | ICD-10-CM | POA: Diagnosis not present

## 2023-03-17 DIAGNOSIS — H31091 Other chorioretinal scars, right eye: Secondary | ICD-10-CM | POA: Diagnosis not present

## 2023-03-17 DIAGNOSIS — H3562 Retinal hemorrhage, left eye: Secondary | ICD-10-CM | POA: Diagnosis not present

## 2023-03-17 DIAGNOSIS — H35033 Hypertensive retinopathy, bilateral: Secondary | ICD-10-CM | POA: Diagnosis not present

## 2023-03-17 DIAGNOSIS — H3581 Retinal edema: Secondary | ICD-10-CM | POA: Diagnosis not present

## 2023-03-19 DIAGNOSIS — Z471 Aftercare following joint replacement surgery: Secondary | ICD-10-CM | POA: Diagnosis not present

## 2023-03-19 DIAGNOSIS — M6281 Muscle weakness (generalized): Secondary | ICD-10-CM | POA: Diagnosis not present

## 2023-03-19 DIAGNOSIS — R269 Unspecified abnormalities of gait and mobility: Secondary | ICD-10-CM | POA: Diagnosis not present

## 2023-03-19 DIAGNOSIS — Z96651 Presence of right artificial knee joint: Secondary | ICD-10-CM | POA: Diagnosis not present

## 2023-03-20 ENCOUNTER — Other Ambulatory Visit (HOSPITAL_COMMUNITY): Payer: Self-pay

## 2023-03-25 DIAGNOSIS — Z96651 Presence of right artificial knee joint: Secondary | ICD-10-CM | POA: Diagnosis not present

## 2023-03-25 DIAGNOSIS — R269 Unspecified abnormalities of gait and mobility: Secondary | ICD-10-CM | POA: Diagnosis not present

## 2023-03-25 DIAGNOSIS — Z471 Aftercare following joint replacement surgery: Secondary | ICD-10-CM | POA: Diagnosis not present

## 2023-03-25 DIAGNOSIS — M6281 Muscle weakness (generalized): Secondary | ICD-10-CM | POA: Diagnosis not present

## 2023-03-26 ENCOUNTER — Other Ambulatory Visit (HOSPITAL_COMMUNITY): Payer: Self-pay

## 2023-04-03 DIAGNOSIS — Z471 Aftercare following joint replacement surgery: Secondary | ICD-10-CM | POA: Diagnosis not present

## 2023-04-03 DIAGNOSIS — M6281 Muscle weakness (generalized): Secondary | ICD-10-CM | POA: Diagnosis not present

## 2023-04-03 DIAGNOSIS — R269 Unspecified abnormalities of gait and mobility: Secondary | ICD-10-CM | POA: Diagnosis not present

## 2023-04-03 DIAGNOSIS — M1711 Unilateral primary osteoarthritis, right knee: Secondary | ICD-10-CM | POA: Diagnosis not present

## 2023-04-03 DIAGNOSIS — Z96651 Presence of right artificial knee joint: Secondary | ICD-10-CM | POA: Diagnosis not present

## 2023-04-07 DIAGNOSIS — I10 Essential (primary) hypertension: Secondary | ICD-10-CM | POA: Diagnosis not present

## 2023-04-07 DIAGNOSIS — C9210 Chronic myeloid leukemia, BCR/ABL-positive, not having achieved remission: Secondary | ICD-10-CM | POA: Diagnosis not present

## 2023-04-08 LAB — LAB REPORT - SCANNED: EGFR (Non-African Amer.): 48.8

## 2023-04-13 DIAGNOSIS — Z9181 History of falling: Secondary | ICD-10-CM | POA: Diagnosis not present

## 2023-04-13 DIAGNOSIS — Z803 Family history of malignant neoplasm of breast: Secondary | ICD-10-CM | POA: Diagnosis not present

## 2023-04-13 DIAGNOSIS — Z8673 Personal history of transient ischemic attack (TIA), and cerebral infarction without residual deficits: Secondary | ICD-10-CM | POA: Diagnosis not present

## 2023-04-13 DIAGNOSIS — E039 Hypothyroidism, unspecified: Secondary | ICD-10-CM | POA: Diagnosis not present

## 2023-04-13 DIAGNOSIS — M810 Age-related osteoporosis without current pathological fracture: Secondary | ICD-10-CM | POA: Diagnosis not present

## 2023-04-13 DIAGNOSIS — E785 Hyperlipidemia, unspecified: Secondary | ICD-10-CM | POA: Diagnosis not present

## 2023-04-13 DIAGNOSIS — M1990 Unspecified osteoarthritis, unspecified site: Secondary | ICD-10-CM | POA: Diagnosis not present

## 2023-04-13 DIAGNOSIS — Z008 Encounter for other general examination: Secondary | ICD-10-CM | POA: Diagnosis not present

## 2023-04-13 DIAGNOSIS — K219 Gastro-esophageal reflux disease without esophagitis: Secondary | ICD-10-CM | POA: Diagnosis not present

## 2023-04-13 DIAGNOSIS — R32 Unspecified urinary incontinence: Secondary | ICD-10-CM | POA: Diagnosis not present

## 2023-04-13 DIAGNOSIS — I739 Peripheral vascular disease, unspecified: Secondary | ICD-10-CM | POA: Diagnosis not present

## 2023-04-13 DIAGNOSIS — Z8249 Family history of ischemic heart disease and other diseases of the circulatory system: Secondary | ICD-10-CM | POA: Diagnosis not present

## 2023-04-13 DIAGNOSIS — N1831 Chronic kidney disease, stage 3a: Secondary | ICD-10-CM | POA: Diagnosis not present

## 2023-04-14 DIAGNOSIS — D649 Anemia, unspecified: Secondary | ICD-10-CM | POA: Diagnosis not present

## 2023-04-14 DIAGNOSIS — Z23 Encounter for immunization: Secondary | ICD-10-CM | POA: Diagnosis not present

## 2023-04-14 DIAGNOSIS — I10 Essential (primary) hypertension: Secondary | ICD-10-CM | POA: Diagnosis not present

## 2023-04-14 DIAGNOSIS — K219 Gastro-esophageal reflux disease without esophagitis: Secondary | ICD-10-CM | POA: Diagnosis not present

## 2023-04-14 DIAGNOSIS — M170 Bilateral primary osteoarthritis of knee: Secondary | ICD-10-CM | POA: Diagnosis not present

## 2023-04-28 ENCOUNTER — Other Ambulatory Visit: Payer: Self-pay

## 2023-05-02 ENCOUNTER — Other Ambulatory Visit (HOSPITAL_COMMUNITY): Payer: Self-pay

## 2023-05-06 IMAGING — CT CT HEAD W/O CM
4 series · 16 of 47 positions shown, 18 images · non-contrast
Comparison: None Available.

CLINICAL DATA: Head trauma, moderate-severe



[Series 2: head wo · axial · 0.44mm/px · z∈[+1040,+1160]mm · 7 of 32 slices shown, 9 images]
[im 4/32  brain]
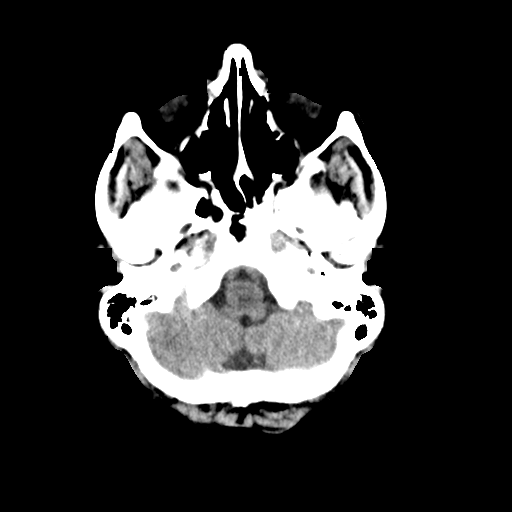
[im 4/32  bone]
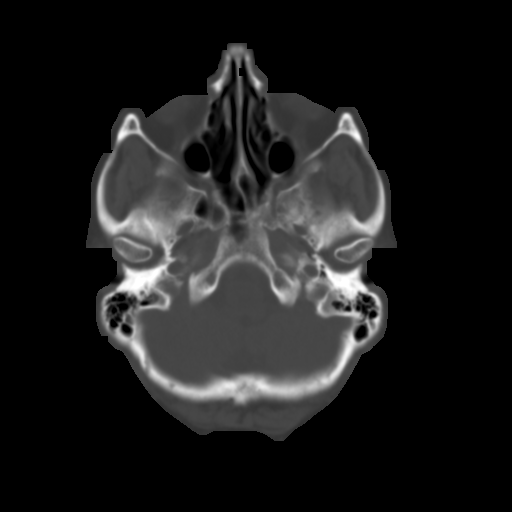
[im 8/32  brain]
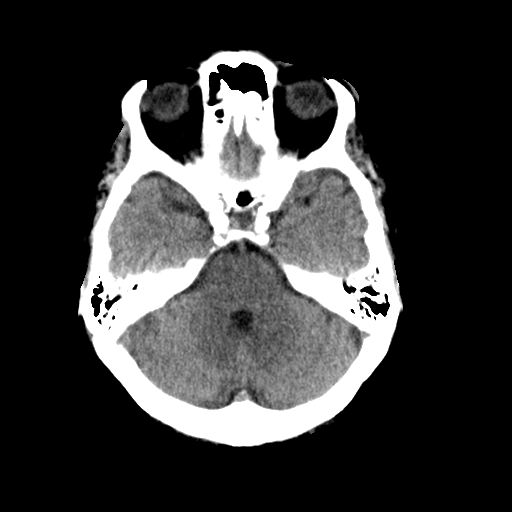
[im 12/32  brain]
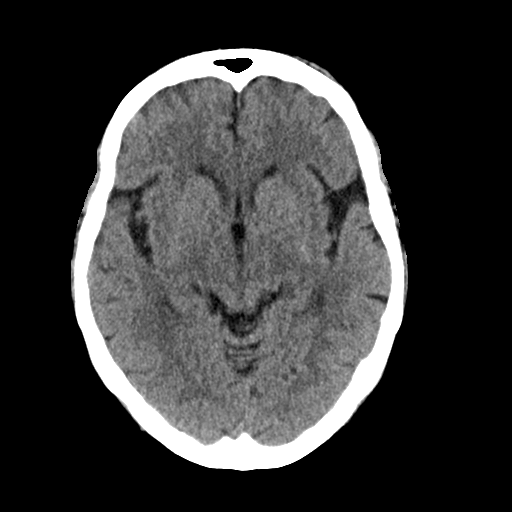
[im 16/32  brain]
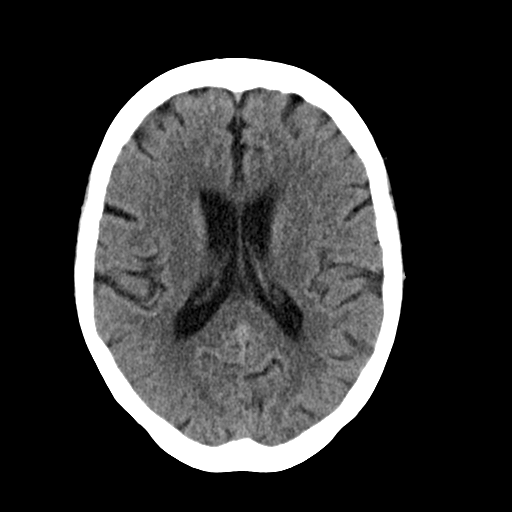
[im 20/32  brain]
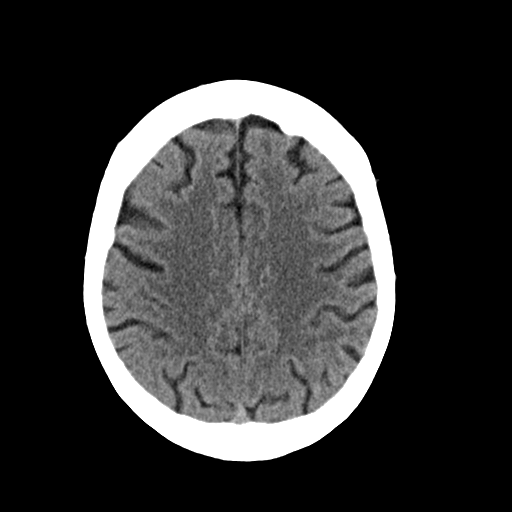
[im 20/32  bone]
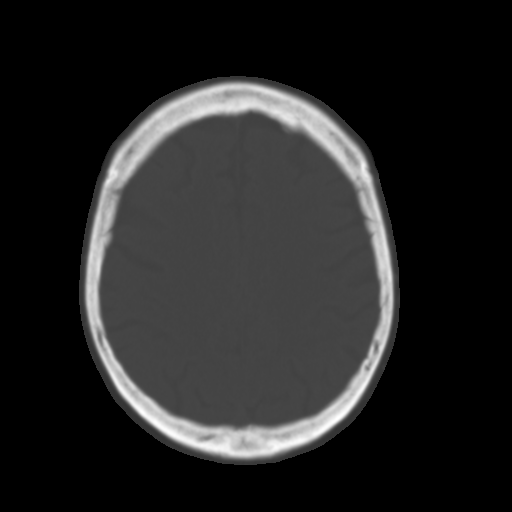
[im 24/32  brain]
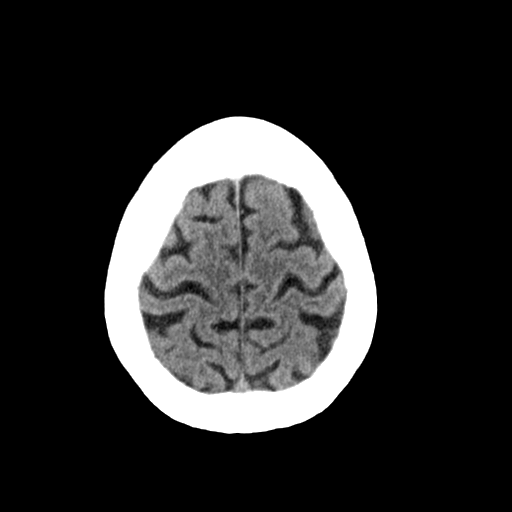
[im 28/32  brain]
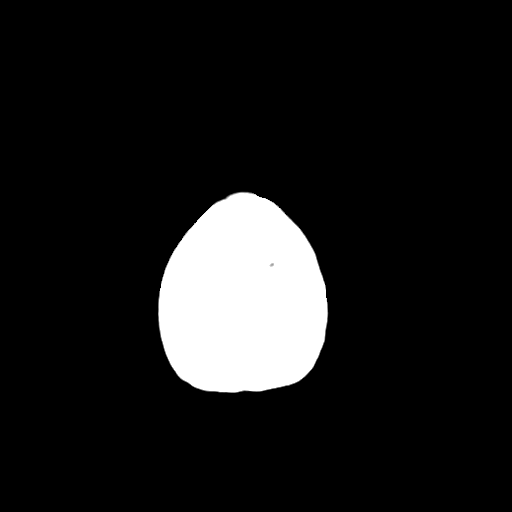

[Series 3: head bone · axial · 0.44mm/px · z∈[+1039,+1071]mm · 3 of 80 slices shown]
[im 8/80  bone]
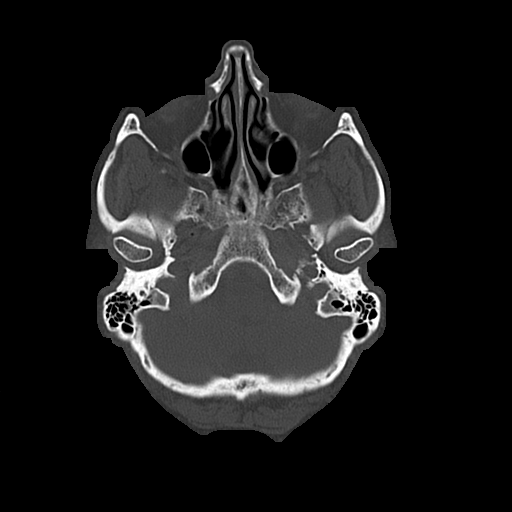
[im 16/80  bone]
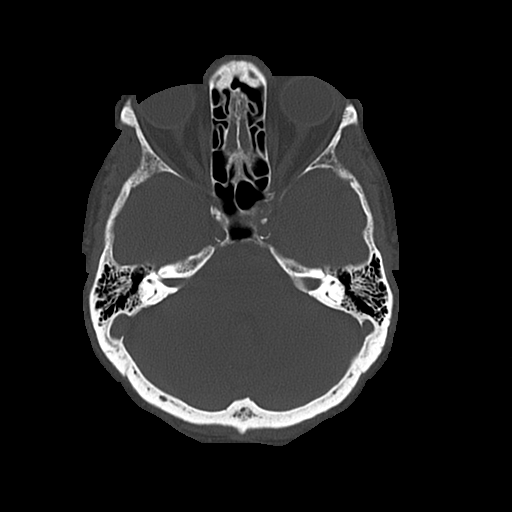
[im 24/80  bone]
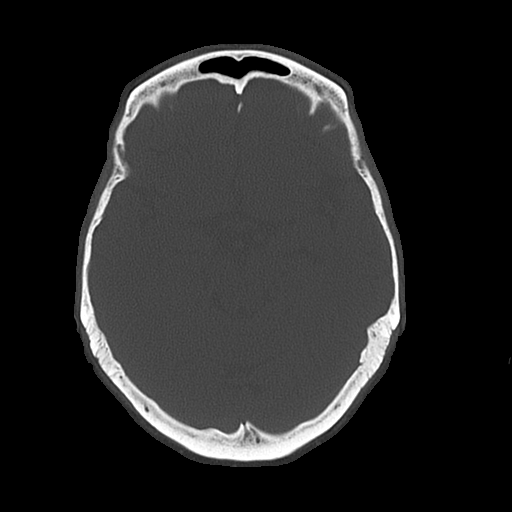

[Series 4: coronal soft · coronal · 0.32mm/px · 3 of 68 slices shown]
[im 23/68  brain]
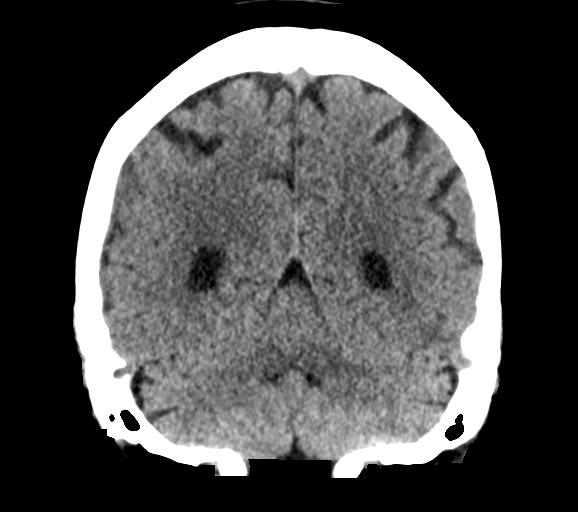
[im 30/68  brain]
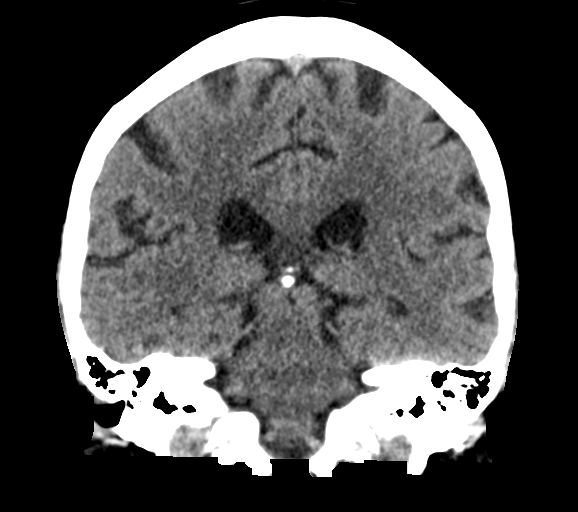
[im 38/68  brain]
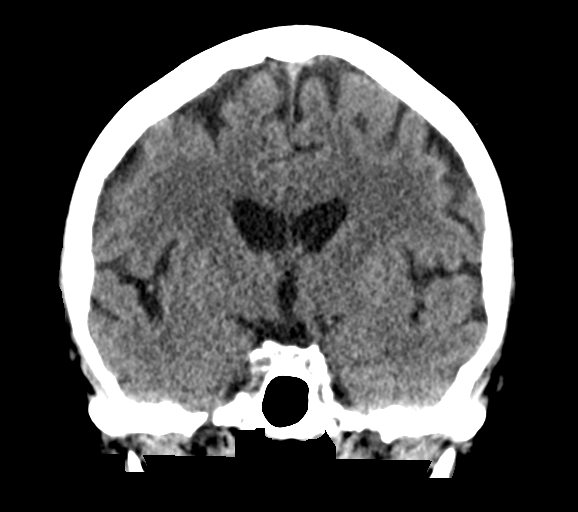

[Series 5: sagittal soft · sagittal · 0.32mm/px · 3 of 61 slices shown]
[im 21/61  brain]
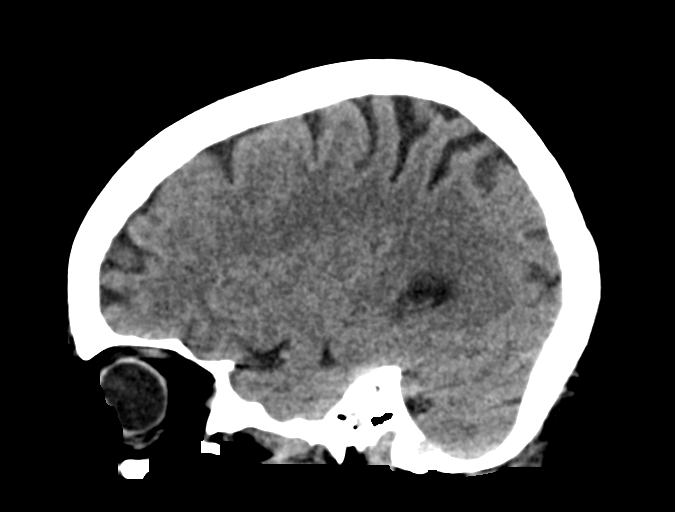
[im 31/61  brain]
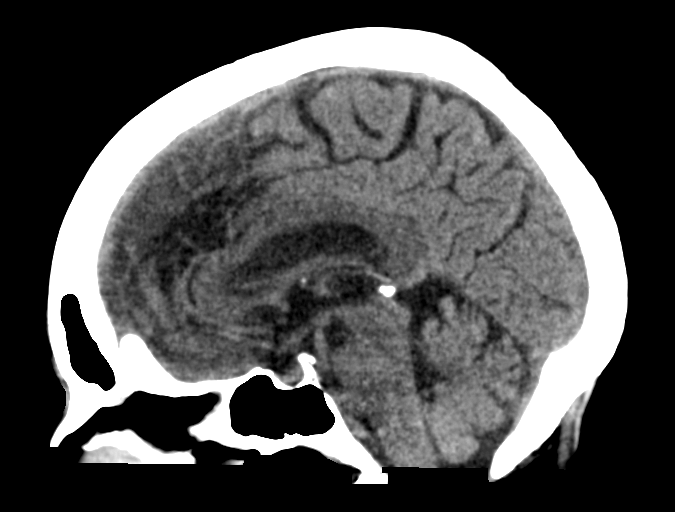
[im 41/61  brain]
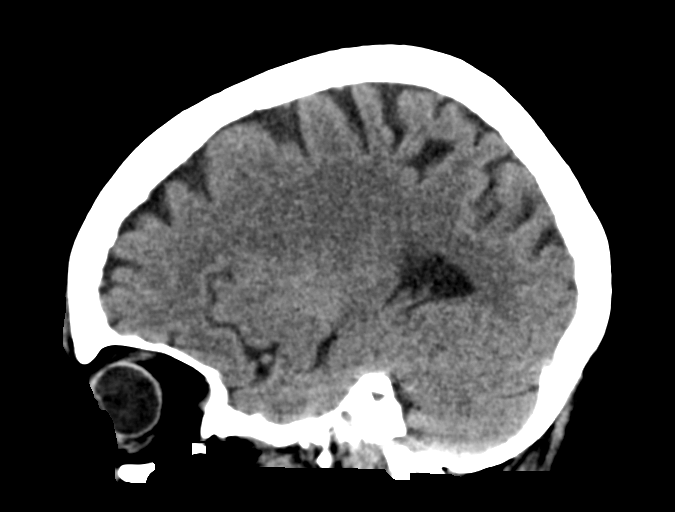

[16 of 47 positions shown; findings below may reference images not displayed]

FINDINGS: Brain: There is no acute intracranial hemorrhage, mass effect, or
edema. Gray-white differentiation is preserved. There is no
extra-axial fluid collection. Ventricles and sulci are within normal
limits in size and configuration.

Vascular: There is atherosclerotic calcification at the skull base.

Skull: Calvarium is unremarkable.

Sinuses/Orbits: No acute finding.

Other: None.
IMPRESSION: No evidence of acute intracranial injury.

## 2023-05-12 DIAGNOSIS — D649 Anemia, unspecified: Secondary | ICD-10-CM | POA: Diagnosis not present

## 2023-05-12 DIAGNOSIS — I10 Essential (primary) hypertension: Secondary | ICD-10-CM | POA: Diagnosis not present

## 2023-05-12 IMAGING — CR DG WRIST COMPLETE 3+V*L*
4 series · 4 of 4 positions shown · non-contrast
Comparison: None Available.

CLINICAL DATA: Left wrist pain and swelling, injury 1 week ago

EXAM:
LEFT WRIST - COMPLETE 3+ VIEW

[x wrist pa left (1 of 3)]
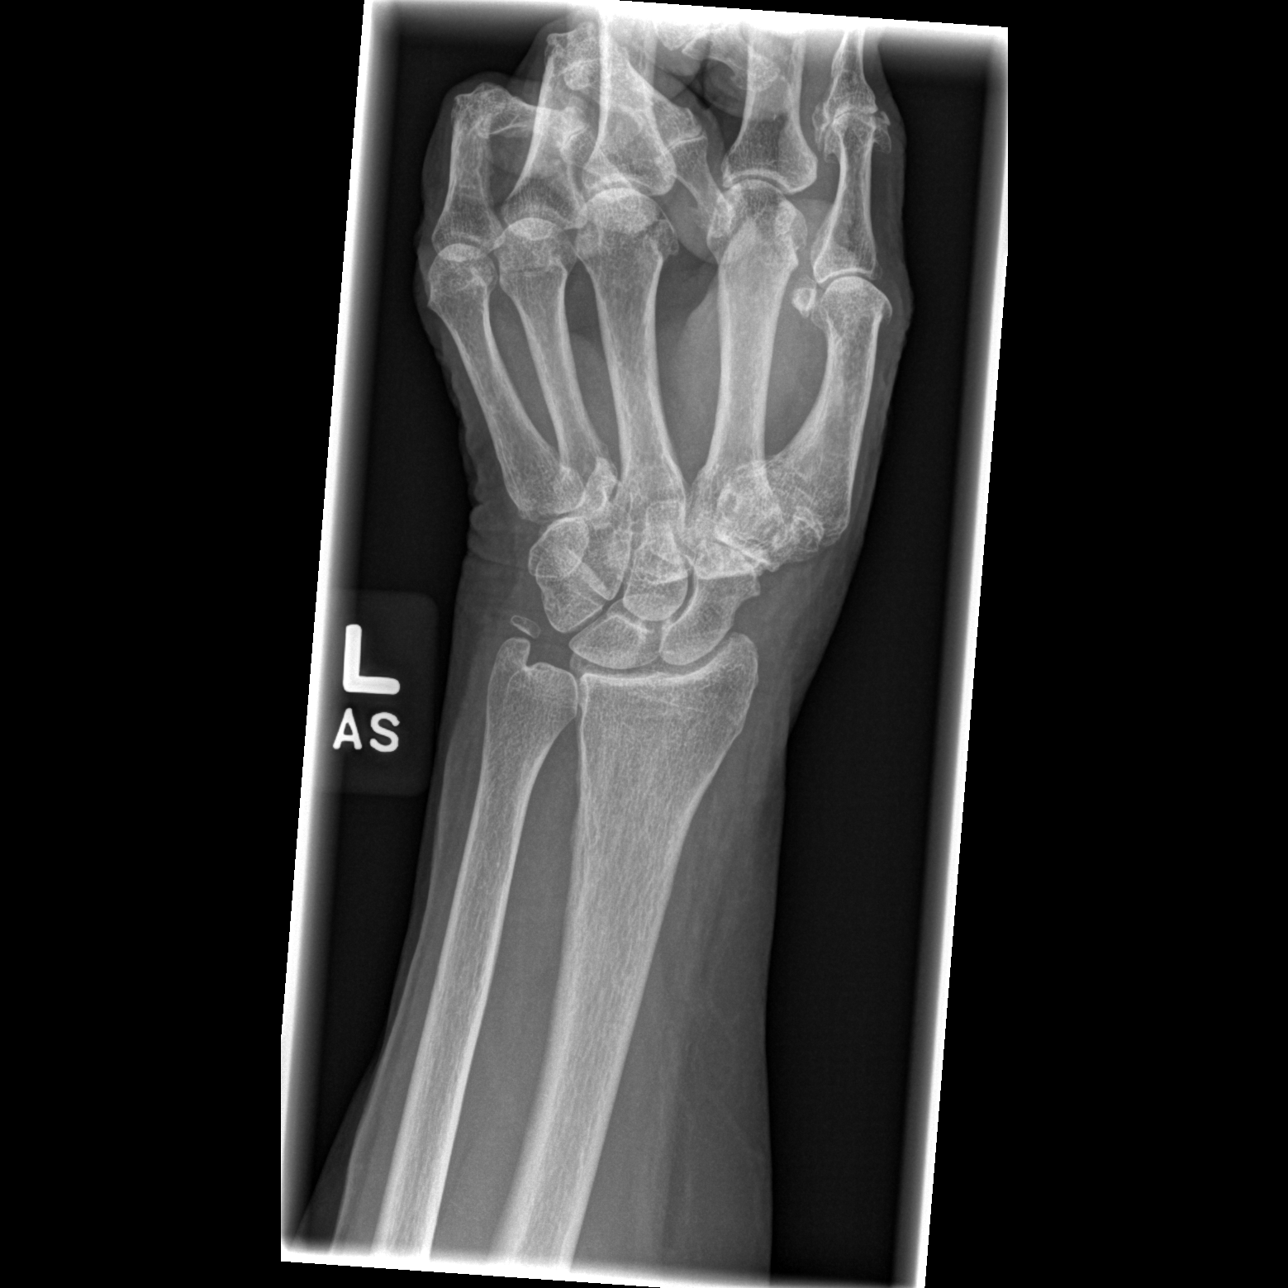

[x wrist pa left (2 of 3)]
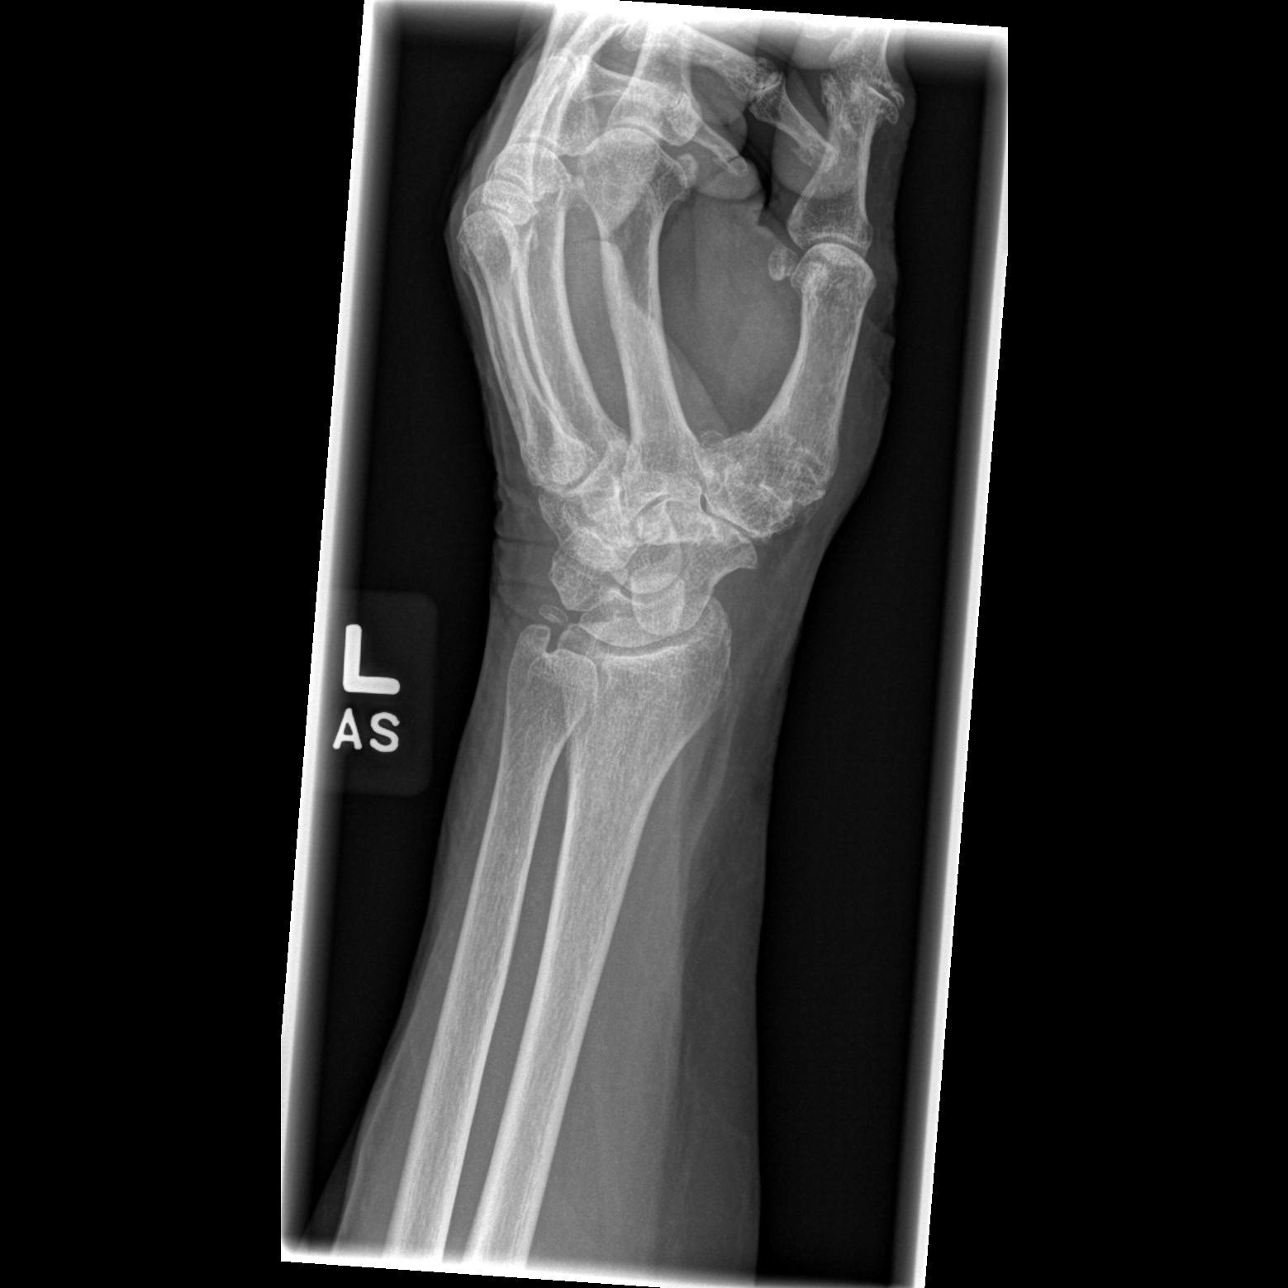

[x wrist lat left]
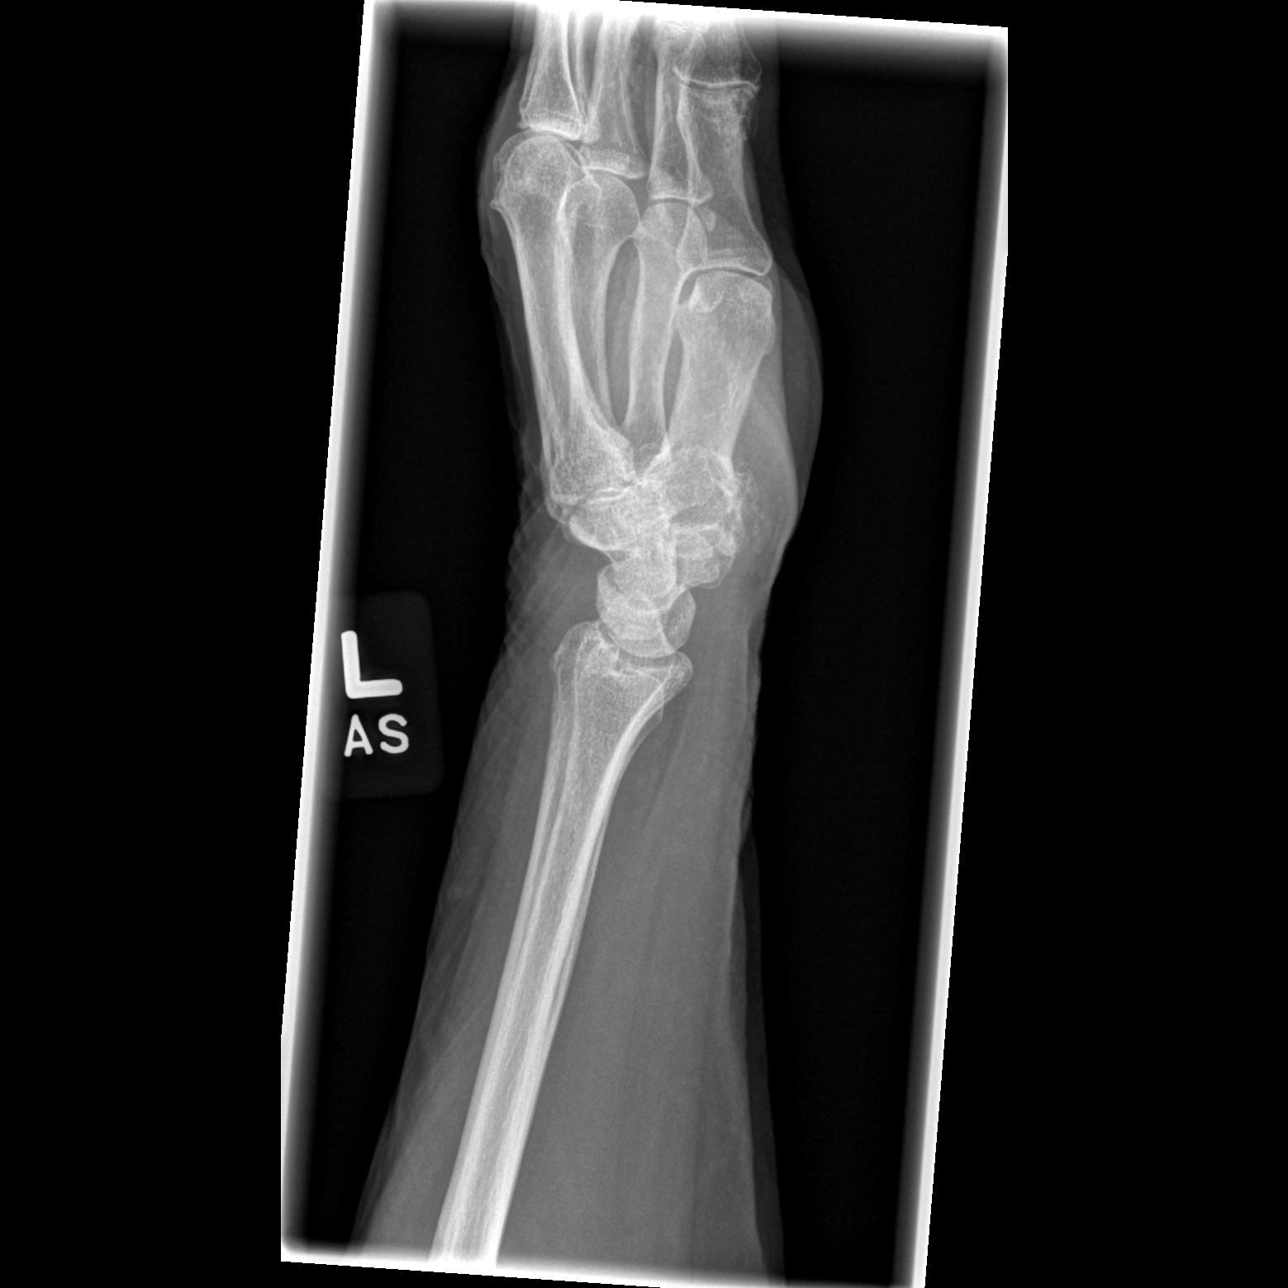

[x wrist pa left (3 of 3)]
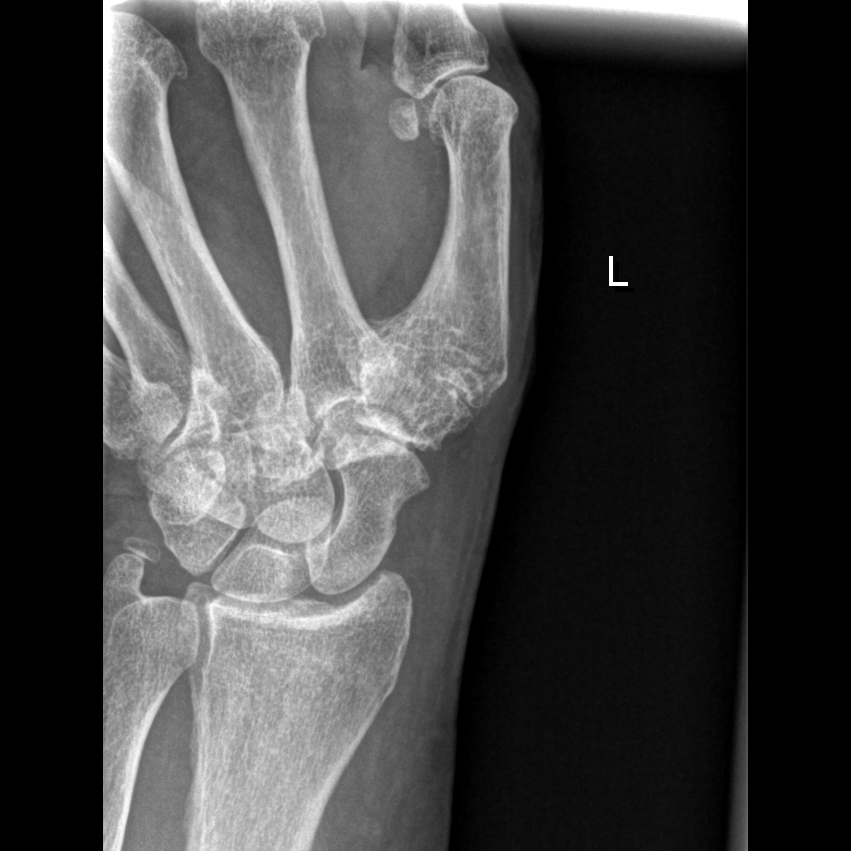

[4 of 4 positions shown; findings below may reference images not displayed]

FINDINGS: Well corticated ossicle distal to the ulnar styloid. Spurring and
loss of articular space at the articulation of the scaphoid and the
trapezium. Severe degenerative arthropathy and suspected at least
partial fusion at the articulation of the trapezium and base of the
thumb metacarpal. Pronator fat pad unremarkable. No acute fracture
identified.
IMPRESSION: 1. Markedly severe arthropathy and possibly partial fusion of the
first carpometacarpal junction. Substantial arthropathy at the
articulation of the scaphoid and the trapezium.
2. Well corticated ossicle distal to the ulnar styloid, possibly the
result of remote trauma, but no acute fracture is identified.
3. If pain persists despite conservative therapy, MRI may be
warranted for further characterization.

## 2023-05-13 LAB — LAB REPORT - SCANNED: EGFR (Non-African Amer.): 64.3

## 2023-05-19 DIAGNOSIS — D509 Iron deficiency anemia, unspecified: Secondary | ICD-10-CM | POA: Diagnosis not present

## 2023-05-19 DIAGNOSIS — I10 Essential (primary) hypertension: Secondary | ICD-10-CM | POA: Diagnosis not present

## 2023-05-19 DIAGNOSIS — K219 Gastro-esophageal reflux disease without esophagitis: Secondary | ICD-10-CM | POA: Diagnosis not present

## 2023-05-19 DIAGNOSIS — C9210 Chronic myeloid leukemia, BCR/ABL-positive, not having achieved remission: Secondary | ICD-10-CM | POA: Diagnosis not present

## 2023-05-19 DIAGNOSIS — M179 Osteoarthritis of knee, unspecified: Secondary | ICD-10-CM | POA: Diagnosis not present

## 2023-05-21 ENCOUNTER — Other Ambulatory Visit: Payer: Self-pay

## 2023-05-21 ENCOUNTER — Telehealth: Payer: Self-pay

## 2023-05-21 DIAGNOSIS — C9210 Chronic myeloid leukemia, BCR/ABL-positive, not having achieved remission: Secondary | ICD-10-CM

## 2023-05-21 NOTE — Telephone Encounter (Signed)
Patient called stating her WBC counts have been dropping and wanted to know if she needed to follow up sooner than August with Dr.Ennever. Pt had her labs faxed from her PCP and reviewed by Dr.Ennever. pt to come back in 3 weeks for lab only and keep follow up in August. Pt aware and lab appt.

## 2023-05-22 ENCOUNTER — Other Ambulatory Visit (HOSPITAL_COMMUNITY): Payer: Self-pay

## 2023-05-30 ENCOUNTER — Other Ambulatory Visit: Payer: Self-pay

## 2023-06-09 ENCOUNTER — Inpatient Hospital Stay: Payer: Medicare HMO | Attending: Hematology & Oncology

## 2023-06-09 DIAGNOSIS — C9211 Chronic myeloid leukemia, BCR/ABL-positive, in remission: Secondary | ICD-10-CM | POA: Insufficient documentation

## 2023-06-09 DIAGNOSIS — C9210 Chronic myeloid leukemia, BCR/ABL-positive, not having achieved remission: Secondary | ICD-10-CM | POA: Diagnosis not present

## 2023-06-09 LAB — CBC WITH DIFFERENTIAL (CANCER CENTER ONLY)
Abs Immature Granulocytes: 0.01 10*3/uL (ref 0.00–0.07)
Basophils Absolute: 0 10*3/uL (ref 0.0–0.1)
Basophils Relative: 0 %
Eosinophils Absolute: 0.2 10*3/uL (ref 0.0–0.5)
Eosinophils Relative: 4 %
HCT: 31.9 % — ABNORMAL LOW (ref 36.0–46.0)
Hemoglobin: 10.2 g/dL — ABNORMAL LOW (ref 12.0–15.0)
Immature Granulocytes: 0 %
Lymphocytes Relative: 30 %
Lymphs Abs: 1.2 10*3/uL (ref 0.7–4.0)
MCH: 31.5 pg (ref 26.0–34.0)
MCHC: 32 g/dL (ref 30.0–36.0)
MCV: 98.5 fL (ref 80.0–100.0)
Monocytes Absolute: 0.5 10*3/uL (ref 0.1–1.0)
Monocytes Relative: 13 %
Neutro Abs: 2 10*3/uL (ref 1.7–7.7)
Neutrophils Relative %: 53 %
Platelet Count: 163 10*3/uL (ref 150–400)
RBC: 3.24 MIL/uL — ABNORMAL LOW (ref 3.87–5.11)
RDW: 13.2 % (ref 11.5–15.5)
WBC Count: 3.9 10*3/uL — ABNORMAL LOW (ref 4.0–10.5)
nRBC: 0 % (ref 0.0–0.2)

## 2023-06-09 LAB — CMP (CANCER CENTER ONLY)
ALT: 14 U/L (ref 0–44)
AST: 19 U/L (ref 15–41)
Albumin: 4.1 g/dL (ref 3.5–5.0)
Alkaline Phosphatase: 51 U/L (ref 38–126)
Anion gap: 5 (ref 5–15)
BUN: 23 mg/dL (ref 8–23)
CO2: 28 mmol/L (ref 22–32)
Calcium: 9.7 mg/dL (ref 8.9–10.3)
Chloride: 105 mmol/L (ref 98–111)
Creatinine: 1.18 mg/dL — ABNORMAL HIGH (ref 0.44–1.00)
GFR, Estimated: 46 mL/min — ABNORMAL LOW (ref >60)
Glucose, Bld: 99 mg/dL (ref 70–99)
Potassium: 5.2 mmol/L — ABNORMAL HIGH (ref 3.5–5.1)
Sodium: 138 mmol/L (ref 135–145)
Total Bilirubin: 0.8 mg/dL (ref 0.3–1.2)
Total Protein: 6.7 g/dL (ref 6.5–8.1)

## 2023-06-09 LAB — LACTATE DEHYDROGENASE: LDH: 182 U/L (ref 98–192)

## 2023-06-12 DIAGNOSIS — K219 Gastro-esophageal reflux disease without esophagitis: Secondary | ICD-10-CM | POA: Diagnosis not present

## 2023-06-12 DIAGNOSIS — I10 Essential (primary) hypertension: Secondary | ICD-10-CM | POA: Diagnosis not present

## 2023-06-18 ENCOUNTER — Other Ambulatory Visit (HOSPITAL_COMMUNITY): Payer: Self-pay

## 2023-06-18 LAB — BCR/ABL

## 2023-06-19 DIAGNOSIS — H3562 Retinal hemorrhage, left eye: Secondary | ICD-10-CM | POA: Diagnosis not present

## 2023-06-19 DIAGNOSIS — H31091 Other chorioretinal scars, right eye: Secondary | ICD-10-CM | POA: Diagnosis not present

## 2023-06-19 DIAGNOSIS — H3581 Retinal edema: Secondary | ICD-10-CM | POA: Diagnosis not present

## 2023-06-19 DIAGNOSIS — H34231 Retinal artery branch occlusion, right eye: Secondary | ICD-10-CM | POA: Diagnosis not present

## 2023-06-19 DIAGNOSIS — H35033 Hypertensive retinopathy, bilateral: Secondary | ICD-10-CM | POA: Diagnosis not present

## 2023-06-19 DIAGNOSIS — H3509 Other intraretinal microvascular abnormalities: Secondary | ICD-10-CM | POA: Diagnosis not present

## 2023-06-25 ENCOUNTER — Other Ambulatory Visit: Payer: Self-pay

## 2023-06-26 DIAGNOSIS — E785 Hyperlipidemia, unspecified: Secondary | ICD-10-CM | POA: Diagnosis not present

## 2023-06-26 DIAGNOSIS — C9210 Chronic myeloid leukemia, BCR/ABL-positive, not having achieved remission: Secondary | ICD-10-CM | POA: Diagnosis not present

## 2023-06-26 DIAGNOSIS — I10 Essential (primary) hypertension: Secondary | ICD-10-CM | POA: Diagnosis not present

## 2023-06-26 DIAGNOSIS — E039 Hypothyroidism, unspecified: Secondary | ICD-10-CM | POA: Diagnosis not present

## 2023-07-03 DIAGNOSIS — G720 Drug-induced myopathy: Secondary | ICD-10-CM | POA: Diagnosis not present

## 2023-07-03 DIAGNOSIS — I10 Essential (primary) hypertension: Secondary | ICD-10-CM | POA: Diagnosis not present

## 2023-07-03 DIAGNOSIS — E782 Mixed hyperlipidemia: Secondary | ICD-10-CM | POA: Diagnosis not present

## 2023-07-03 DIAGNOSIS — Z789 Other specified health status: Secondary | ICD-10-CM | POA: Diagnosis not present

## 2023-07-03 DIAGNOSIS — M1711 Unilateral primary osteoarthritis, right knee: Secondary | ICD-10-CM | POA: Diagnosis not present

## 2023-07-03 DIAGNOSIS — D649 Anemia, unspecified: Secondary | ICD-10-CM | POA: Diagnosis not present

## 2023-07-03 DIAGNOSIS — E039 Hypothyroidism, unspecified: Secondary | ICD-10-CM | POA: Diagnosis not present

## 2023-07-08 ENCOUNTER — Ambulatory Visit: Payer: BC Managed Care – PPO | Admitting: Internal Medicine

## 2023-07-10 DIAGNOSIS — Z23 Encounter for immunization: Secondary | ICD-10-CM | POA: Diagnosis not present

## 2023-07-16 DIAGNOSIS — Z1331 Encounter for screening for depression: Secondary | ICD-10-CM | POA: Diagnosis not present

## 2023-07-16 DIAGNOSIS — Z1339 Encounter for screening examination for other mental health and behavioral disorders: Secondary | ICD-10-CM | POA: Diagnosis not present

## 2023-07-16 DIAGNOSIS — Z0000 Encounter for general adult medical examination without abnormal findings: Secondary | ICD-10-CM | POA: Diagnosis not present

## 2023-07-17 ENCOUNTER — Inpatient Hospital Stay: Payer: Medicare HMO | Admitting: Hematology & Oncology

## 2023-07-17 ENCOUNTER — Inpatient Hospital Stay: Payer: Medicare HMO | Attending: Hematology & Oncology

## 2023-07-17 ENCOUNTER — Encounter: Payer: Self-pay | Admitting: Hematology & Oncology

## 2023-07-17 VITALS — BP 139/45 | HR 57 | Temp 98.8°F | Resp 20 | Ht 61.5 in | Wt 148.1 lb

## 2023-07-17 DIAGNOSIS — Z79899 Other long term (current) drug therapy: Secondary | ICD-10-CM | POA: Insufficient documentation

## 2023-07-17 DIAGNOSIS — C9211 Chronic myeloid leukemia, BCR/ABL-positive, in remission: Secondary | ICD-10-CM | POA: Diagnosis not present

## 2023-07-17 DIAGNOSIS — Z7982 Long term (current) use of aspirin: Secondary | ICD-10-CM | POA: Insufficient documentation

## 2023-07-17 DIAGNOSIS — C9210 Chronic myeloid leukemia, BCR/ABL-positive, not having achieved remission: Secondary | ICD-10-CM

## 2023-07-17 LAB — CBC WITH DIFFERENTIAL (CANCER CENTER ONLY)
Abs Immature Granulocytes: 0.01 10*3/uL (ref 0.00–0.07)
Basophils Absolute: 0 10*3/uL (ref 0.0–0.1)
Basophils Relative: 1 %
Eosinophils Absolute: 0.2 10*3/uL (ref 0.0–0.5)
Eosinophils Relative: 5 %
HCT: 33.3 % — ABNORMAL LOW (ref 36.0–46.0)
Hemoglobin: 10.8 g/dL — ABNORMAL LOW (ref 12.0–15.0)
Immature Granulocytes: 0 %
Lymphocytes Relative: 42 %
Lymphs Abs: 1.9 10*3/uL (ref 0.7–4.0)
MCH: 32 pg (ref 26.0–34.0)
MCHC: 32.4 g/dL (ref 30.0–36.0)
MCV: 98.5 fL (ref 80.0–100.0)
Monocytes Absolute: 0.6 10*3/uL (ref 0.1–1.0)
Monocytes Relative: 13 %
Neutro Abs: 1.7 10*3/uL (ref 1.7–7.7)
Neutrophils Relative %: 39 %
Platelet Count: 168 10*3/uL (ref 150–400)
RBC: 3.38 MIL/uL — ABNORMAL LOW (ref 3.87–5.11)
RDW: 13.3 % (ref 11.5–15.5)
WBC Count: 4.4 10*3/uL (ref 4.0–10.5)
nRBC: 0 % (ref 0.0–0.2)

## 2023-07-17 LAB — CMP (CANCER CENTER ONLY)
ALT: 14 U/L (ref 0–44)
AST: 18 U/L (ref 15–41)
Albumin: 4.2 g/dL (ref 3.5–5.0)
Alkaline Phosphatase: 54 U/L (ref 38–126)
Anion gap: 6 (ref 5–15)
BUN: 26 mg/dL — ABNORMAL HIGH (ref 8–23)
CO2: 28 mmol/L (ref 22–32)
Calcium: 9.6 mg/dL (ref 8.9–10.3)
Chloride: 103 mmol/L (ref 98–111)
Creatinine: 1.35 mg/dL — ABNORMAL HIGH (ref 0.44–1.00)
GFR, Estimated: 39 mL/min — ABNORMAL LOW (ref >60)
Glucose, Bld: 92 mg/dL (ref 70–99)
Potassium: 5 mmol/L (ref 3.5–5.1)
Sodium: 137 mmol/L (ref 135–145)
Total Bilirubin: 0.7 mg/dL (ref 0.3–1.2)
Total Protein: 6.7 g/dL (ref 6.5–8.1)

## 2023-07-17 LAB — LACTATE DEHYDROGENASE: LDH: 191 U/L (ref 98–192)

## 2023-07-17 NOTE — Progress Notes (Signed)
Hematology and Oncology Follow Up Visit  Joshalyn Tobler [id number] 1941/07/17 82 y.o. 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 Shuff is in for follow-up.  She comes back to see [place].  We see her every 6 months.  The good news is that her granddaughter is expecting her first great grand child.  Hopefully everything will go well.  I know that she is still suffering from her 1 daughter passed away back in 11-21-23 from breast cancer.  I know this has been very hard on her.  She is having some problems with her right knee.  I think she may have had some surgery for this.  Her last BCR/ABL did not show any evidence of activity so she is still in a molecular remission.  She has had no problems with bowels or bladder.  She has had no cough.  There is been no issues with COVID.  She has had no leg swelling.  Overall, I would say that her performance status is probably ECOG 1.    Medications:  Current Outpatient Medications:    aspirin EC 81 MG tablet, Take 81 mg by mouth daily., Disp: , Rfl:    atorvastatin (LIPITOR) 20 MG tablet, Take 20 mg by mouth daily., Disp: , Rfl:    carvedilol (COREG) 3.125 MG tablet, Take 3.125 mg by mouth 2 (two) times daily., Disp: , Rfl:    Cholecalciferol (VITAMIN D) 125 MCG (5000 UT) CAPS, Take 5,000 Units by mouth 3 (three) times a week., Disp: , Rfl:    Cyanocobalamin (VITAMIN B12) 1000 MCG TBCR, Take 1,000 mcg by mouth 3 (three) times a week., Disp: , Rfl:    dasatinib (SPRYCEL) 50 MG tablet, TAKE 1 TABLET (50 MG TOTAL) BY MOUTH EVERY OTHER DAY., Disp: 15 tablet, Rfl: 8   famotidine (PEPCID) 20 MG tablet, Take 20 mg by mouth 2 (two) times daily., Disp: , Rfl:    levothyroxine (SYNTHROID) 112 MCG tablet, Take 112 mcg by mouth 3 (three) times a week., Disp: , Rfl:    telmisartan-hydrochlorothiazide (MICARDIS HCT) 80-25 MG tablet, Take 1  tablet by mouth daily., Disp: , Rfl:    Multiple Vitamin (MULTIVITAMIN WITH MINERALS) TABS tablet, Take 1 tablet by mouth daily. (Patient not taking: Reported on 07/17/2023), Disp: , Rfl:    ondansetron (ZOFRAN) 8 MG tablet, Take 1 tablet (8 mg total) by mouth every 8 (eight) hours as needed for nausea or vomiting. (Patient not taking: Reported on 07/17/2023), Disp: 40 tablet, Rfl: 3   polyethylene glycol (MIRALAX / GLYCOLAX) 17 g packet, Take 17 g by mouth daily as needed for moderate constipation. (Patient not taking: Reported on 07/17/2023), Disp: , Rfl:   Allergies:  Allergies  Allergen Reactions   Amlodipine Besylate Swelling    Swelling in legs   Amoxicillin-Pot Clavulanate Diarrhea   Versed [Midazolam]     PT does not want any Versed due to memory loss potential     Past Medical History, Surgical history, Social history, and Family History were reviewed and updated.  Review of Systems: Review of Systems  Constitutional: Negative.   HENT:  Negative.    Eyes: Negative.   Respiratory: Negative.    Cardiovascular: Negative.   Gastrointestinal: Negative.   Endocrine: Negative.   Genitourinary: Negative.    Musculoskeletal: Negative.   Skin: Negative.   Neurological:  Negative.   Hematological: Negative.   Psychiatric/Behavioral: Negative.      Physical Exam:  height is 5' 1.5" (1.562 m) and weight is 148 lb 1.3 oz (67.2 kg). Her oral temperature is 98.8 F (37.1 C). Her blood pressure is 139/45 (abnormal) and her pulse is 57 (abnormal). Her respiration is 20 and oxygen saturation is 100%.   Wt Readings from Last 3 Encounters:  07/17/23 148 lb 1.3 oz (67.2 kg)  01/20/23 161 lb 6.4 oz (73.2 kg)  01/17/23 161 lb 6.4 oz (73.2 kg)    Physical Exam Vitals reviewed.  HENT:     Head: Normocephalic and atraumatic.  Eyes:     Pupils: Pupils are equal, round, and reactive to light.  Cardiovascular:     Rate and Rhythm: Normal rate and regular rhythm.     Heart sounds: Normal  heart sounds.  Pulmonary:     Effort: Pulmonary effort is normal.     Breath sounds: Normal breath sounds.  Abdominal:     General: Bowel sounds are normal.     Palpations: Abdomen is soft.  Musculoskeletal:        General: No tenderness or deformity. Normal range of motion.     Cervical back: Normal range of motion.  Lymphadenopathy:     Cervical: No cervical adenopathy.  Skin:    General: Skin is warm and dry.     Findings: No erythema or rash.  Neurological:     Mental Status: She is alert and oriented to person, place, and time.  Psychiatric:        Behavior: Behavior normal.        Thought Content: Thought content normal.        Judgment: Judgment normal.      Lab Results  Component Value Date   WBC 4.4 07/17/2023   HGB 10.8 (L) 07/17/2023   HCT 33.3 (L) 07/17/2023   MCV 98.5 07/17/2023   PLT 168 07/17/2023     Chemistry      Component Value Date/Time   NA 137 07/17/2023 1147   K 5.0 07/17/2023 1147   CL 103 07/17/2023 1147   CO2 28 07/17/2023 1147   BUN 26 (H) 07/17/2023 1147   CREATININE 1.35 (H) 07/17/2023 1147      Component Value Date/Time   CALCIUM 9.6 07/17/2023 1147   ALKPHOS 54 07/17/2023 1147   AST 18 07/17/2023 1147   ALT 14 07/17/2023 1147   BILITOT 0.7 07/17/2023 1147      Impression and Plan: Ms. Demello is a a very charming 82 year old white female.  She looks somewhat younger than 82 years old.    She has chronic phase CML.  She has been in a major molecular remission.  We will have to see what the BCR/ABL level is now.  We will plan to see her back in another 6 months.  Hopefully, we will have good news about a great grandchild.   Josph Macho, MD 8/22/202412:26 PM

## 2023-07-18 DIAGNOSIS — Z1231 Encounter for screening mammogram for malignant neoplasm of breast: Secondary | ICD-10-CM | POA: Diagnosis not present

## 2023-07-22 ENCOUNTER — Other Ambulatory Visit (HOSPITAL_COMMUNITY): Payer: Self-pay

## 2023-07-22 LAB — BCR/ABL

## 2023-08-13 DIAGNOSIS — I10 Essential (primary) hypertension: Secondary | ICD-10-CM | POA: Diagnosis not present

## 2023-08-13 DIAGNOSIS — E785 Hyperlipidemia, unspecified: Secondary | ICD-10-CM | POA: Diagnosis not present

## 2023-08-13 DIAGNOSIS — R5383 Other fatigue: Secondary | ICD-10-CM | POA: Diagnosis not present

## 2023-08-13 DIAGNOSIS — E039 Hypothyroidism, unspecified: Secondary | ICD-10-CM | POA: Diagnosis not present

## 2023-08-19 DIAGNOSIS — Z789 Other specified health status: Secondary | ICD-10-CM | POA: Diagnosis not present

## 2023-08-19 DIAGNOSIS — E039 Hypothyroidism, unspecified: Secondary | ICD-10-CM | POA: Diagnosis not present

## 2023-08-19 DIAGNOSIS — K5900 Constipation, unspecified: Secondary | ICD-10-CM | POA: Diagnosis not present

## 2023-08-19 DIAGNOSIS — I10 Essential (primary) hypertension: Secondary | ICD-10-CM | POA: Diagnosis not present

## 2023-08-19 DIAGNOSIS — E782 Mixed hyperlipidemia: Secondary | ICD-10-CM | POA: Diagnosis not present

## 2023-08-26 ENCOUNTER — Other Ambulatory Visit (HOSPITAL_COMMUNITY): Payer: Self-pay

## 2023-08-26 ENCOUNTER — Other Ambulatory Visit: Payer: Self-pay

## 2023-08-26 NOTE — Progress Notes (Signed)
Specialty Pharmacy Refill Coordination Note  Jodi Woods is a 82 y.o. female contacted today regarding refills of specialty medication(s) Dasatinib .  Patient requested Delivery  on 09/03/23  to verified address [address] [place] [address]   Medication will be filled on 09/02/23.

## 2023-08-27 ENCOUNTER — Encounter (HOSPITAL_BASED_OUTPATIENT_CLINIC_OR_DEPARTMENT_OTHER): Payer: Self-pay | Admitting: Cardiovascular Disease

## 2023-08-27 ENCOUNTER — Ambulatory Visit (INDEPENDENT_AMBULATORY_CARE_PROVIDER_SITE_OTHER): Payer: Medicare HMO | Admitting: Cardiovascular Disease

## 2023-08-27 VITALS — BP 170/58 | HR 55 | Ht 61.5 in | Wt 148.9 lb

## 2023-08-27 DIAGNOSIS — Z5181 Encounter for therapeutic drug level monitoring: Secondary | ICD-10-CM | POA: Diagnosis not present

## 2023-08-27 DIAGNOSIS — E7800 Pure hypercholesterolemia, unspecified: Secondary | ICD-10-CM | POA: Diagnosis not present

## 2023-08-27 DIAGNOSIS — I10 Essential (primary) hypertension: Secondary | ICD-10-CM

## 2023-08-27 HISTORY — DX: Pure hypercholesterolemia, unspecified: E78.00

## 2023-08-27 HISTORY — DX: Essential (primary) hypertension: I10

## 2023-08-27 MED ORDER — SPIRONOLACTONE 25 MG PO TABS: 25.0000 mg | ORAL_TABLET | Freq: Every day | ORAL | 3 refills | Status: DC

## 2023-08-27 NOTE — Patient Instructions (Signed)
Medication Instructions:  START SPIRONOLACTONE 25 MG DAILY   Labwork: BMET IN 1 WEEK   Testing/Procedures: NONE  Follow-Up: 1 MONTH WITH PHARM D AT [facility] LOCATION   4 MONTHS WITH DR [place] [facility]   Any Other Special Instructions Will Be Listed Below (If Applicable). MONITOR YOUR BLOOD PRESSURE TWICE A DAY, LOG IN THE BOOK PROVIDED. BRING THE BOOK AND YOUR BLOOD PRESSURE MACHINE TO YOUR FOLLOW UP IN 1 MONTH

## 2023-08-27 NOTE — Progress Notes (Signed)
Advanced Hypertension Clinic Initial Assessment:    Date:  08/27/2023   ID:  Jodi Woods, DOB 10/27/1941, MRN [id number]  PCP:  Lewis Moccasin, MD  Cardiologist:  None  Nephrologist:  Referring MD: Lewis Moccasin, MD   CC: Hypertension  History of Present Illness:    Jodi Woods is a 82 y.o. female with a hx of hypertension, hyperlipidemia, hypothyroidism, GERD, ANA+, systemic lupus erythematosus, chronic myelocytic leukemia, retinal aneurysm, here to establish care in the Advanced Hypertension Clinic at the request of Dr. Maryelizabeth Rowan. She was seen 06/12/2023 by telehealth visit. She was monitoring home blood pressures and reported readings including 144/67, 138/66 on a regimen of carvedilol 3.125 mg BID, telmisartan-HCTZ 80-25 mg daily. She requested a referral to establish care with cardiology; never had a prior stress test. She did have an echo 01/2020 showing LVEF 60-65%, normal diastolic parameters, moderately elevated PASP 51.0 mmHg, trivial aortic regurgitation but no stenosis, and no evidence of mitral regurgitation or stenosis.  Today, she states she is feeling great aside from her uncontrolled blood pressure, which has been difficult to control since 2015. She confirms that within the past week, her PCP increased her carvedilol to 6.25 mg BID, and her HCTZ was discontinued. She expresses concerns about being on carvedilol as in the past this caused side effects of bradycardia in the 40's. In the office her blood pressure is 173/62 initially, and 170/58 on manual recheck. She denies any associated lightheadedness or dizziness. Recently she checked at home and her blood pressure was 160/67, but improved to 121/62 about ten minutes later. She does well with remembering to take her medications. She does not participate in much formal exercise. Limited by joint pain and lower back pain. In 12/2022 she underwent right total knee arthroplasty. Since then the ROM of her right knee  has never really returned to her baseline. Around that time she also struggled with persistent nausea and loss of appetite, and lost about 18 lbs. At one point she believed she had developed gastroparesis. Typically she cooks her meals at home, and will add salt to her meals. Generally tries to follow a good diet, although some days are worse than others. Normally she drinks 1 cup of coffee a day, only rare soft drinks. Alcohol consumption is limited to rare social occasions. At night, she believes she does snore and complains of frequent night sweats. She tends to sleep well until 2-3 AM, at which point she tosses and turns more often. No apneic episodes per her husband. She denies any palpitations, chest pain, shortness of breath, peripheral edema, headaches, syncope, orthopnea, or PND.  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   atorvastatin (LIPITOR) 20 MG tablet Take 20 mg by mouth daily.   carvedilol (COREG) 6.25 MG tablet Take 6.25 mg by mouth 2 (two) times daily.   Cholecalciferol (VITAMIN D) 125 MCG (5000 UT) CAPS Take  5,000 Units by mouth 3 (three) times a week.   Cyanocobalamin (VITAMIN B12) 1000 MCG TBCR Take 1,000 mcg by mouth 3 (three) times a week.   dasatinib (SPRYCEL) 50 MG tablet TAKE 1 TABLET (50 MG TOTAL) BY MOUTH EVERY OTHER DAY.   famotidine (PEPCID) 20 MG tablet Take 20 mg by mouth 2 (two) times daily.   levothyroxine (SYNTHROID) 125 MCG tablet Take 125 mcg by mouth daily before breakfast.   Multiple Vitamin (MULTIVITAMIN WITH MINERALS) TABS tablet Take 1 tablet by mouth daily.   ondansetron (ZOFRAN) 8 MG tablet Take 1 tablet (8 mg total) by mouth every 8 (eight) hours as needed for nausea or vomiting.   polyethylene glycol (MIRALAX / GLYCOLAX) 17 g packet Take 17 g by mouth daily as needed for moderate constipation.   telmisartan (MICARDIS) 80 MG tablet Take 80 mg by mouth daily.     Allergies:   Amlodipine besylate, Amoxicillin-pot clavulanate, and Versed [midazolam]   Social History   Socioeconomic History   Marital status: Widowed    Spouse name: Not on file   Number of children: Not on file   Years of education: Not on file   Highest education level: Not on file  Occupational History   Not on file  Tobacco Use   Smoking status: Never   Smokeless tobacco: Never  Vaping Use   Vaping status: Never Used  Substance and Sexual Activity   Alcohol use: Yes    Comment: rare   Drug use: Never   Sexual activity: Not Currently  Other Topics Concern   Not on file  Social History Narrative   Not on file   Social Determinants of Health   Financial Resource Strain: Not on file  Food Insecurity: No Food Insecurity (01/20/2023)   Hunger Vital Sign    Worried About Running Out of Food in the Last Year: Never true    Ran Out of Food in the Last Year: Never true  Transportation Needs: No Transportation Needs (08/27/2023)   PRAPARE - Administrator, Civil Service (Medical): No    Lack of Transportation (Non-Medical): No  Physical Activity: Sufficiently Active (08/27/2023)    Exercise Vital Sign    Days of Exercise per Week: 5 days    Minutes of Exercise per Session: 30 min  Stress: Not on file  Social Connections: Not on file     Family History: The patient's family history includes Heart attack in her maternal grandmother; Lung cancer in her father.  ROS:   Please see the history of present illness.    (+) Arthralgias (+) Lower back pain (+) Snoring (+) Night sweats All other systems reviewed and are negative.  EKGs/Labs/Other Studies Reviewed:    Echo  02/23/2020:  1. Left ventricular ejection fraction, by estimation, is 60 to 65%. The  left ventricle has normal function. The left ventricle has no regional  wall motion abnormalities. Left ventricular diastolic parameters were  normal.   2. Right ventricular systolic function is normal. The right ventricular  size is normal. There is moderately  elevated pulmonary artery systolic  pressure. The estimated right ventricular systolic pressure is 51.0 mmHg.   3. The mitral valve is normal in structure. No evidence of mitral valve  regurgitation. No evidence of mitral stenosis.   4. The aortic valve is normal in structure. Aortic valve regurgitation is  trivial. No aortic stenosis is present.   5. The inferior vena cava is normal in size with greater than 50%  respiratory variability, suggesting right atrial pressure of 3 mmHg.   Comparison(s): No prior Echocardiogram.   EKG:  EKG is personally reviewed. 08/27/2023: Not ordered.  Recent Labs: 07/17/2023: ALT 14; BUN 26; Creatinine 1.35; Hemoglobin 10.8; Platelet Count 168; Potassium 5.0; Sodium 137   Recent Lipid Panel No results found for: "CHOL", "TRIG", "HDL", "CHOLHDL", "VLDL", "LDLCALC", "LDLDIRECT"  Physical Exam:    VS:  BP (!) 170/58 (BP Location: Right Arm, Patient Position: Sitting, Cuff Size: Normal)   Pulse (!) 55   Ht 5' 1.5" (1.562 m)   Wt 148 lb 14.4 oz (67.5 kg)   SpO2 97%   BMI 27.68 kg/m  , BMI Body mass index is 27.68  kg/m. GENERAL:  Well appearing HEENT: Pupils equal round and reactive, fundi not visualized, oral mucosa unremarkable NECK:  No jugular venous distention, waveform within normal limits, carotid upstroke brisk and symmetric, no bruits, no thyromegaly LYMPHATICS:  No cervical adenopathy LUNGS:  Clear to auscultation bilaterally HEART:  RRR.  PMI not displaced or sustained, S1 and S2 within normal limits, no S3, no S4, no clicks, no rubs, no murmurs ABD:  Flat, positive bowel sounds normal in frequency in pitch, no bruits, no rebound, no guarding, no midline pulsatile mass, no hepatomegaly, no splenomegaly EXT:  2 plus pulses throughout, no edema, no cyanosis, no clubbing SKIN:  No rashes, no nodules NEURO:  Cranial nerves II through XII grossly intact, motor grossly intact throughout PSYCH:  Cognitively intact, oriented to person place and time   ASSESSMENT/PLAN:    # Hypertension Uncontrolled despite multiple medication trials. Currently on Carvedilol and Telmisartan. Previous intolerance to Amlodipine (edema) and Lisinopril. High salt diet and low physical activity likely contributing factors.  Thiazide presumably held previously 2/2 AKI.  -Continue Carvedilol 6.25mg , monitor for bradycardia. -Add Spironolactone 25mg  daily. Check BMP in one week. -Check blood pressure twice daily and record. -Reduce dietary salt intake. -Increase physical activity, consider Right Start program after physical therapy for knee. -Review medication and lifestyle changes in 1 month. -Continue carvedilol and telmisartan.  No   # Post-operative knee Limited range of motion following knee replacement in February. -Refer for physical therapy to improve range of motion. -Consider Right Start program for personalized exercise plan.   # Visual disturbances History of retinal aneurysm with residual visual disturbances.  -No current intervention needed, monitor for changes.   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    -Continue atorvastatin  # Depression Reports feeling down since husband's death in 11/07/2020. -Consider mental health screening and potential referral for counseling if needed.  # Lower back pain Unexplained lower back pain limiting physical activity. -Consider physical therapy or pain management referral if pain continues to limit activity.  General Health Maintenance -Continue monitoring blood pressure and heart rate at home. -Encourage increased physical activity and reduced salt intake. -Follow up in 4 months or sooner if symptoms worsen.      Screening for Secondary Hypertension:     08/27/2023    9:32 AM  Causes  Drugs/Herbals Screened     - Comments high sodium diet.  1 coffee daily.  Rare EtOH.  Renovascular HTN N/A  Sleep Apnea Screened     - Comments snores.  no other symptoms.  no overnight desaturation  Thyroid Disease Screened  Hyperaldosteronism N/A  Pheochromocytoma N/A  Cushing's Syndrome N/A  Hyperparathyroidism Screened  Coarctation of the Aorta Screened     - Comments BP symmetric  Compliance Screened    Relevant Labs/Studies:    Latest Ref Rng & Units 07/17/2023   11:47 AM 06/09/2023    9:53 AM 01/21/2023    3:15 AM  Basic Labs  Sodium 135 - 145 mmol/L 137  138  136   Potassium 3.5 - 5.1 mmol/L 5.0  5.2  4.5   Creatinine 0.44 - 1.00 mg/dL 0.86  5.78  4.69       Disposition:    FU with APP/PharmD in 1 month for the next 3 months.   FU with Cayleb Jarnigan C. Duke Salvia, MD, [facility] in 4 months.  Medication Adjustments/Labs and Tests Ordered: Current medicines are reviewed at length with the patient today.  Concerns regarding medicines are outlined above.   No orders of the defined types were placed in this encounter.  No orders of the defined types were placed in this encounter.  I,Mathew Stumpf,acting as a Neurosurgeon for Chilton Si, MD.,have documented all relevant documentation on the behalf of Chilton Si, MD,as  directed by  Chilton Si, MD while in the presence of Chilton Si, MD.  I, Blakelyn Dinges C. Duke Salvia, MD have reviewed all documentation for this visit.  The documentation of the exam, diagnosis, procedures, and orders on 08/27/2023 are all accurate and complete.   Guadalupe Maple  08/27/2023 9:45 AM    [facility]

## 2023-09-02 ENCOUNTER — Telehealth: Payer: Self-pay | Admitting: Pharmacist

## 2023-09-02 ENCOUNTER — Other Ambulatory Visit: Payer: Self-pay

## 2023-09-02 DIAGNOSIS — C9210 Chronic myeloid leukemia, BCR/ABL-positive, not having achieved remission: Secondary | ICD-10-CM

## 2023-09-02 MED ORDER — SPRYCEL 50 MG PO TABS
50.0000 mg | ORAL_TABLET | ORAL | 2 refills | Status: DC
Start: 2023-09-02 — End: 2023-12-03
  Filled 2023-09-02: qty 15, 30d supply, fill #0
  Filled 2023-09-30: qty 15, 30d supply, fill #1
  Filled 2023-10-27: qty 15, 30d supply, fill #2

## 2023-09-02 NOTE — Telephone Encounter (Signed)
Oral Oncology Pharmacist Encounter   Due to Sprycel now being available in generic formulation, patient's insurance is requiring DAW to be marked on Sprycel prescription for processing. Prescription updated with DAW1 and resent to pharmacy for dispensing.    Jodi Woods, PharmD, BCPS, [facility] Hematology/Oncology Clinical Pharmacist [facility] Oral Chemotherapy Navigation Clinics [phone] 09/02/2023 8:52 AM

## 2023-09-16 DIAGNOSIS — Z5181 Encounter for therapeutic drug level monitoring: Secondary | ICD-10-CM | POA: Diagnosis not present

## 2023-09-16 DIAGNOSIS — I10 Essential (primary) hypertension: Secondary | ICD-10-CM | POA: Diagnosis not present

## 2023-09-17 ENCOUNTER — Telehealth (HOSPITAL_BASED_OUTPATIENT_CLINIC_OR_DEPARTMENT_OTHER): Payer: Self-pay | Admitting: *Deleted

## 2023-09-17 DIAGNOSIS — Z5181 Encounter for therapeutic drug level monitoring: Secondary | ICD-10-CM

## 2023-09-17 DIAGNOSIS — I10 Essential (primary) hypertension: Secondary | ICD-10-CM

## 2023-09-17 LAB — BASIC METABOLIC PANEL
BUN/Creatinine Ratio: 27 (ref 12–28)
BUN: 34 mg/dL — ABNORMAL HIGH (ref 8–27)
CO2: 23 mmol/L (ref 20–29)
Calcium: 9.3 mg/dL (ref 8.7–10.3)
Chloride: 105 mmol/L (ref 96–106)
Creatinine, Ser: 1.28 mg/dL — ABNORMAL HIGH (ref 0.57–1.00)
Glucose: 89 mg/dL (ref 70–99)
Potassium: 5.5 mmol/L — ABNORMAL HIGH (ref 3.5–5.2)
Sodium: 140 mmol/L (ref 134–144)
eGFR: 42 mL/min/{1.73_m2} — ABNORMAL LOW (ref >59)

## 2023-09-17 NOTE — Telephone Encounter (Signed)
-----   Message from [facility] sent at 09/17/2023  4:21 PM EDT ----- Kidney function remains mildly abnormal but stable.  Potassium is mildly elevated.  Recommend reducing spironolactone to 12.5 mg.  Repeat basic metabolic panel in a couple weeks.

## 2023-09-17 NOTE — Telephone Encounter (Signed)
Advised patient of lab results and medication change, mailed lab forms to recheck

## 2023-09-26 DIAGNOSIS — R7989 Other specified abnormal findings of blood chemistry: Secondary | ICD-10-CM | POA: Diagnosis not present

## 2023-09-26 DIAGNOSIS — E559 Vitamin D deficiency, unspecified: Secondary | ICD-10-CM | POA: Diagnosis not present

## 2023-09-26 DIAGNOSIS — R5383 Other fatigue: Secondary | ICD-10-CM | POA: Diagnosis not present

## 2023-09-29 ENCOUNTER — Other Ambulatory Visit: Payer: Self-pay

## 2023-09-30 ENCOUNTER — Other Ambulatory Visit: Payer: Self-pay

## 2023-09-30 DIAGNOSIS — R7982 Elevated C-reactive protein (CRP): Secondary | ICD-10-CM | POA: Diagnosis not present

## 2023-09-30 DIAGNOSIS — I10 Essential (primary) hypertension: Secondary | ICD-10-CM | POA: Diagnosis not present

## 2023-09-30 DIAGNOSIS — K5900 Constipation, unspecified: Secondary | ICD-10-CM | POA: Diagnosis not present

## 2023-09-30 DIAGNOSIS — E559 Vitamin D deficiency, unspecified: Secondary | ICD-10-CM | POA: Diagnosis not present

## 2023-09-30 DIAGNOSIS — R7989 Other specified abnormal findings of blood chemistry: Secondary | ICD-10-CM | POA: Diagnosis not present

## 2023-09-30 DIAGNOSIS — S0500XA Injury of conjunctiva and corneal abrasion without foreign body, unspecified eye, initial encounter: Secondary | ICD-10-CM | POA: Diagnosis not present

## 2023-09-30 DIAGNOSIS — H5340 Unspecified visual field defects: Secondary | ICD-10-CM | POA: Diagnosis not present

## 2023-09-30 DIAGNOSIS — Z23 Encounter for immunization: Secondary | ICD-10-CM | POA: Diagnosis not present

## 2023-09-30 NOTE — Progress Notes (Signed)
Specialty Pharmacy Ongoing Clinical Assessment Note  Jodi Woods is a 82 y.o. female who is being followed by the specialty pharmacy service for RxSp Oncology   Patient's specialty medication(s) reviewed today: Dasatinib   Missed doses in the last 4 weeks: 0   Patient/Caregiver did not have any additional questions or concerns.   Therapeutic benefit summary: Patient is achieving benefit   Adverse events/side effects summary: No adverse events/side effects   Patient's therapy is appropriate to: Continue    Goals Addressed             This Visit's Progress    Achieve or maintain remission       Patient is on track. Patient will maintain adherence. Per provider visit notes from 07/17/23 BCR/ABL shows patient remains in molecular remission.          Follow up:  6 months  Otto Herb Specialty Pharmacist

## 2023-09-30 NOTE — Progress Notes (Signed)
Specialty Pharmacy Refill Coordination Note  Kadesia Robel is a 82 y.o. female contacted today regarding refills of specialty medication(s) Dasatinib   Patient requested Delivery   Delivery date: 10/03/23   Verified address: [address] [place] [address]   Medication will be filled on 10/02/23.

## 2023-10-02 DIAGNOSIS — M1711 Unilateral primary osteoarthritis, right knee: Secondary | ICD-10-CM | POA: Diagnosis not present

## 2023-10-06 DIAGNOSIS — I10 Essential (primary) hypertension: Secondary | ICD-10-CM | POA: Diagnosis not present

## 2023-10-21 ENCOUNTER — Other Ambulatory Visit (HOSPITAL_COMMUNITY): Payer: Self-pay

## 2023-10-24 ENCOUNTER — Other Ambulatory Visit: Payer: Self-pay

## 2023-10-27 ENCOUNTER — Other Ambulatory Visit: Payer: Self-pay

## 2023-10-27 NOTE — Progress Notes (Signed)
Specialty Pharmacy Refill Coordination Note  Jodi Woods is a 82 y.o. female contacted today regarding refills of specialty medication(s) Dasatinib   Patient requested Delivery   Delivery date: 10/29/23   Verified address: [address] [place] [address]   Medication will be filled on 10/28/23.

## 2023-10-29 DIAGNOSIS — E538 Deficiency of other specified B group vitamins: Secondary | ICD-10-CM | POA: Diagnosis not present

## 2023-10-29 DIAGNOSIS — F411 Generalized anxiety disorder: Secondary | ICD-10-CM | POA: Diagnosis not present

## 2023-10-29 DIAGNOSIS — D649 Anemia, unspecified: Secondary | ICD-10-CM | POA: Diagnosis not present

## 2023-10-29 DIAGNOSIS — F331 Major depressive disorder, recurrent, moderate: Secondary | ICD-10-CM | POA: Diagnosis not present

## 2023-10-29 DIAGNOSIS — R7989 Other specified abnormal findings of blood chemistry: Secondary | ICD-10-CM | POA: Diagnosis not present

## 2023-10-29 DIAGNOSIS — I10 Essential (primary) hypertension: Secondary | ICD-10-CM | POA: Diagnosis not present

## 2023-10-29 DIAGNOSIS — G4700 Insomnia, unspecified: Secondary | ICD-10-CM | POA: Diagnosis not present

## 2023-10-29 DIAGNOSIS — D696 Thrombocytopenia, unspecified: Secondary | ICD-10-CM | POA: Diagnosis not present

## 2023-11-03 ENCOUNTER — Ambulatory Visit (HOSPITAL_BASED_OUTPATIENT_CLINIC_OR_DEPARTMENT_OTHER): Payer: Medicare HMO

## 2023-11-18 ENCOUNTER — Other Ambulatory Visit: Payer: Self-pay

## 2023-11-24 ENCOUNTER — Other Ambulatory Visit: Payer: Self-pay

## 2023-11-25 DIAGNOSIS — G4700 Insomnia, unspecified: Secondary | ICD-10-CM | POA: Diagnosis not present

## 2023-11-25 DIAGNOSIS — F411 Generalized anxiety disorder: Secondary | ICD-10-CM | POA: Diagnosis not present

## 2023-11-25 DIAGNOSIS — F331 Major depressive disorder, recurrent, moderate: Secondary | ICD-10-CM | POA: Diagnosis not present

## 2023-11-25 DIAGNOSIS — I10 Essential (primary) hypertension: Secondary | ICD-10-CM | POA: Diagnosis not present

## 2023-12-03 ENCOUNTER — Other Ambulatory Visit: Payer: Self-pay

## 2023-12-03 ENCOUNTER — Other Ambulatory Visit: Payer: Self-pay | Admitting: Hematology & Oncology

## 2023-12-03 ENCOUNTER — Other Ambulatory Visit (HOSPITAL_COMMUNITY): Payer: Self-pay

## 2023-12-03 DIAGNOSIS — C9210 Chronic myeloid leukemia, BCR/ABL-positive, not having achieved remission: Secondary | ICD-10-CM

## 2023-12-03 MED ORDER — SPRYCEL 50 MG PO TABS
50.0000 mg | ORAL_TABLET | ORAL | 2 refills | Status: DC
  Filled 2023-12-03 – 2023-12-08 (×2): qty 15, 30d supply, fill #0
  Filled 2024-01-01 – 2024-01-09 (×2): qty 15, 30d supply, fill #1
  Filled 2024-02-02 (×2): qty 15, 30d supply, fill #2

## 2023-12-03 NOTE — Progress Notes (Signed)
 Specialty Pharmacy Refill Coordination Note  Jodi Woods is a 83 y.o. female contacted today regarding refills of specialty medication(s) Dasatinib  (Sprycel )   Patient requested [name] at [facility] [place] date: 12/08/23   Medication will be filled on 12/05/23.   Pending refill request  *Patient will be out of town over the weekend, and will return on Monday 01/13. Patient would like to pick up medication at [facility].*

## 2023-12-05 ENCOUNTER — Other Ambulatory Visit: Payer: Self-pay

## 2023-12-05 ENCOUNTER — Other Ambulatory Visit (HOSPITAL_COMMUNITY): Payer: Self-pay

## 2023-12-08 ENCOUNTER — Other Ambulatory Visit: Payer: Self-pay

## 2023-12-08 ENCOUNTER — Other Ambulatory Visit (HOSPITAL_COMMUNITY): Payer: Self-pay

## 2023-12-08 NOTE — Progress Notes (Signed)
 Insurance was called and RX Advance Prescription will be active as of Feb 1st for [employer]. [employer] Copay card can be used as secondary.   For the month of Jan, Patient is okay using [employer] Gold D and paying a Copay of $100.

## 2023-12-08 NOTE — Progress Notes (Signed)
 Patient called to provide updated insurance. Claim still unsuccessful. Sent message to [facility] to follow-up with patient. Best phone number to reach patient is [phone].

## 2023-12-09 ENCOUNTER — Other Ambulatory Visit (HOSPITAL_COMMUNITY): Payer: Self-pay

## 2023-12-09 ENCOUNTER — Other Ambulatory Visit: Payer: Self-pay

## 2023-12-25 ENCOUNTER — Other Ambulatory Visit (HOSPITAL_COMMUNITY): Payer: Self-pay

## 2024-01-01 ENCOUNTER — Telehealth: Payer: Self-pay

## 2024-01-01 ENCOUNTER — Other Ambulatory Visit: Payer: Self-pay

## 2024-01-01 ENCOUNTER — Other Ambulatory Visit (HOSPITAL_COMMUNITY): Payer: Self-pay

## 2024-01-01 NOTE — Telephone Encounter (Signed)
 Oral Oncology Patient Advocate Encounter  Prior Authorization for Sprycel  has been approved.    PA# [id number]  Effective dates: 01/01/24 through 12/31/24  Patients co-pay is $354.23.    Morene Potters, CPhT Oncology Pharmacy Patient Advocate  [facility]  [phone] (phone) [phone] (fax) 01/01/2024 11:02 AM

## 2024-01-01 NOTE — Telephone Encounter (Signed)
 Oral Oncology Patient Advocate Encounter  Change in insurance   Received notification that prior authorization for Sprycel  is required.   PA submitted on 01/01/24  Key [id number]  Status is pending     Morene Potters, CPhT Oncology Pharmacy Patient Advocate  [facility]  [phone] (phone) [phone] (fax) 01/01/2024 11:00 AM

## 2024-01-01 NOTE — Progress Notes (Signed)
 Specialty Pharmacy Refill Coordination Note  Jodi Woods is a 83 y.o. female contacted today regarding refills of specialty medication(s) Dasatinib  (Sprycel )   Patient requested [name] at [facility] at [place] date: 01/06/24   Medication will be filled on 01/05/24.

## 2024-01-05 ENCOUNTER — Other Ambulatory Visit (HOSPITAL_COMMUNITY): Payer: Self-pay

## 2024-01-05 ENCOUNTER — Other Ambulatory Visit: Payer: Self-pay

## 2024-01-07 ENCOUNTER — Encounter (HOSPITAL_BASED_OUTPATIENT_CLINIC_OR_DEPARTMENT_OTHER): Payer: Medicare HMO | Admitting: Cardiovascular Disease

## 2024-01-07 ENCOUNTER — Other Ambulatory Visit: Payer: Self-pay

## 2024-01-09 ENCOUNTER — Other Ambulatory Visit: Payer: Self-pay

## 2024-01-09 ENCOUNTER — Other Ambulatory Visit (HOSPITAL_COMMUNITY): Payer: Self-pay

## 2024-01-12 ENCOUNTER — Other Ambulatory Visit: Payer: Self-pay

## 2024-01-12 ENCOUNTER — Other Ambulatory Visit (HOSPITAL_COMMUNITY): Payer: Self-pay

## 2024-01-13 ENCOUNTER — Other Ambulatory Visit (HOSPITAL_COMMUNITY): Payer: Self-pay

## 2024-01-15 ENCOUNTER — Inpatient Hospital Stay: Payer: Self-pay | Admitting: Hematology & Oncology

## 2024-01-15 ENCOUNTER — Inpatient Hospital Stay: Payer: Self-pay

## 2024-01-20 DIAGNOSIS — D509 Iron deficiency anemia, unspecified: Secondary | ICD-10-CM | POA: Diagnosis not present

## 2024-01-20 DIAGNOSIS — I10 Essential (primary) hypertension: Secondary | ICD-10-CM | POA: Diagnosis not present

## 2024-01-20 DIAGNOSIS — R7989 Other specified abnormal findings of blood chemistry: Secondary | ICD-10-CM | POA: Diagnosis not present

## 2024-01-23 ENCOUNTER — Other Ambulatory Visit: Payer: Self-pay

## 2024-01-27 DIAGNOSIS — M1711 Unilateral primary osteoarthritis, right knee: Secondary | ICD-10-CM | POA: Diagnosis not present

## 2024-01-29 ENCOUNTER — Other Ambulatory Visit (HOSPITAL_COMMUNITY): Payer: Self-pay

## 2024-01-30 ENCOUNTER — Other Ambulatory Visit: Payer: Self-pay

## 2024-01-30 ENCOUNTER — Other Ambulatory Visit (HOSPITAL_BASED_OUTPATIENT_CLINIC_OR_DEPARTMENT_OTHER): Payer: Self-pay

## 2024-01-30 MED ORDER — ATORVASTATIN CALCIUM 20 MG PO TABS
20.0000 mg | ORAL_TABLET | Freq: Every day | ORAL | 3 refills | Status: DC
  Filled 2024-02-09: qty 90, 90d supply, fill #0
  Filled 2024-05-18: qty 90, 90d supply, fill #1

## 2024-01-30 MED ORDER — LEVOTHYROXINE SODIUM 112 MCG PO TABS
112.0000 ug | ORAL_TABLET | ORAL | 0 refills | Status: DC
  Filled 2024-04-07: qty 43, 100d supply, fill #0

## 2024-01-30 MED ORDER — LEVOTHYROXINE SODIUM 112 MCG PO TABS: 112.0000 ug | ORAL_TABLET | ORAL | 1 refills | Status: DC

## 2024-01-30 MED ORDER — SPIRONOLACTONE 25 MG PO TABS: 25.0000 mg | ORAL_TABLET | Freq: Every day | ORAL | 2 refills | Status: DC

## 2024-01-30 MED ORDER — AMLODIPINE BESYLATE 5 MG PO TABS
5.0000 mg | ORAL_TABLET | Freq: Every day | ORAL | 2 refills | Status: DC
  Filled 2024-02-23: qty 90, 90d supply, fill #0
  Filled 2024-06-05: qty 90, 90d supply, fill #1

## 2024-01-30 MED ORDER — CARVEDILOL 6.25 MG PO TABS
6.2500 mg | ORAL_TABLET | Freq: Two times a day (BID) | ORAL | 0 refills | Status: DC
Start: 2023-11-17 — End: 2024-02-06

## 2024-01-30 MED ORDER — FAMOTIDINE 20 MG PO TABS
20.0000 mg | ORAL_TABLET | Freq: Two times a day (BID) | ORAL | 1 refills | Status: DC
  Filled 2024-03-13: qty 180, 90d supply, fill #0

## 2024-01-30 MED ORDER — FAMOTIDINE 20 MG PO TABS
20.0000 mg | ORAL_TABLET | Freq: Two times a day (BID) | ORAL | 3 refills | Status: DC
Start: 2023-10-29 — End: 2024-02-06

## 2024-01-30 MED ORDER — CARVEDILOL 3.125 MG PO TABS: 3.1250 mg | ORAL_TABLET | Freq: Two times a day (BID) | ORAL | 2 refills | Status: DC

## 2024-01-30 MED ORDER — SERTRALINE HCL 25 MG PO TABS
25.0000 mg | ORAL_TABLET | Freq: Every morning | ORAL | 0 refills | Status: DC
Start: 2023-11-25 — End: 2024-07-08
  Filled 2024-03-29: qty 90, 90d supply, fill #0

## 2024-01-30 MED ORDER — CLINDAMYCIN HCL 300 MG PO CAPS: 600.0000 mg | ORAL_CAPSULE | ORAL | 1 refills | Status: DC

## 2024-01-30 MED ORDER — LEVOTHYROXINE SODIUM 125 MCG PO TABS: 125.0000 ug | ORAL_TABLET | ORAL | 0 refills | Status: DC

## 2024-01-30 MED ORDER — LEVOTHYROXINE SODIUM 125 MCG PO TABS
125.0000 ug | ORAL_TABLET | ORAL | 0 refills | Status: DC
  Filled 2024-07-02: qty 51, 89d supply, fill #0

## 2024-01-30 MED ORDER — HYDROCHLOROTHIAZIDE 25 MG PO TABS
25.0000 mg | ORAL_TABLET | Freq: Every day | ORAL | 2 refills | Status: DC
  Filled 2024-03-13: qty 90, 90d supply, fill #0
  Filled 2024-03-17: qty 90, 90d supply, fill #1

## 2024-01-30 MED ORDER — AMOXICILLIN 500 MG PO CAPS: 2000.0000 mg | ORAL_CAPSULE | ORAL | 0 refills | Status: DC

## 2024-02-02 ENCOUNTER — Other Ambulatory Visit (HOSPITAL_COMMUNITY): Payer: Self-pay

## 2024-02-02 ENCOUNTER — Other Ambulatory Visit: Payer: Self-pay

## 2024-02-02 ENCOUNTER — Telehealth: Payer: Self-pay | Admitting: Pharmacy Technician

## 2024-02-02 NOTE — Telephone Encounter (Signed)
 Oral Oncology Patient Advocate Encounter   Received notification from [facility] that prior authorization for Sprycel is required through pt's commercial plan.   PA submitted on 02/02/2024 Key [id number] Status is pending     [name], CPhT Oncology Pharmacy Patient Advocate [facility] Direct Number: [phone] Fax: [phone]

## 2024-02-02 NOTE — Telephone Encounter (Signed)
 Oral Oncology Patient Advocate Encounter  Prior Authorization for Sprycel has been approved.    PA#  [id number] Effective dates: 02/01/2024 through 01/31/2025  Approval has been shared with [facility]   [name], CPhT Oncology Pharmacy Patient Advocate [facility] Direct Number: [phone] Fax: [phone]

## 2024-02-02 NOTE — Progress Notes (Signed)
 Specialty Pharmacy Refill Coordination Note  Jodi Woods is a 83 y.o. female contacted today regarding refills of specialty medication(s) Sprycel  Patient requested: Delivery    Delivery date:  02/04/24  Verified address: [address] [place],  [place] [address]  Medication will be filled on 02/03/24.

## 2024-02-06 ENCOUNTER — Inpatient Hospital Stay: Payer: Self-pay | Attending: Hematology & Oncology

## 2024-02-06 ENCOUNTER — Inpatient Hospital Stay: Payer: Self-pay | Admitting: Family

## 2024-02-06 ENCOUNTER — Encounter: Payer: Self-pay | Admitting: Family

## 2024-02-06 VITALS — BP 159/45 | HR 68 | Temp 98.2°F | Resp 18 | Ht 61.0 in | Wt 150.0 lb

## 2024-02-06 DIAGNOSIS — C9211 Chronic myeloid leukemia, BCR/ABL-positive, in remission: Secondary | ICD-10-CM | POA: Insufficient documentation

## 2024-02-06 DIAGNOSIS — Z7982 Long term (current) use of aspirin: Secondary | ICD-10-CM | POA: Insufficient documentation

## 2024-02-06 DIAGNOSIS — C9210 Chronic myeloid leukemia, BCR/ABL-positive, not having achieved remission: Secondary | ICD-10-CM

## 2024-02-06 DIAGNOSIS — Z79899 Other long term (current) drug therapy: Secondary | ICD-10-CM | POA: Diagnosis not present

## 2024-02-06 LAB — CBC WITH DIFFERENTIAL (CANCER CENTER ONLY)
Abs Immature Granulocytes: 0.02 10*3/uL (ref 0.00–0.07)
Basophils Absolute: 0 10*3/uL (ref 0.0–0.1)
Basophils Relative: 0 %
Eosinophils Absolute: 0.2 10*3/uL (ref 0.0–0.5)
Eosinophils Relative: 3 %
HCT: 33.6 % — ABNORMAL LOW (ref 36.0–46.0)
Hemoglobin: 11.1 g/dL — ABNORMAL LOW (ref 12.0–15.0)
Immature Granulocytes: 0 %
Lymphocytes Relative: 33 %
Lymphs Abs: 1.7 10*3/uL (ref 0.7–4.0)
MCH: 32 pg (ref 26.0–34.0)
MCHC: 33 g/dL (ref 30.0–36.0)
MCV: 96.8 fL (ref 80.0–100.0)
Monocytes Absolute: 0.5 10*3/uL (ref 0.1–1.0)
Monocytes Relative: 11 %
Neutro Abs: 2.7 10*3/uL (ref 1.7–7.7)
Neutrophils Relative %: 53 %
Platelet Count: 155 10*3/uL (ref 150–400)
RBC: 3.47 MIL/uL — ABNORMAL LOW (ref 3.87–5.11)
RDW: 13.7 % (ref 11.5–15.5)
WBC Count: 5.1 10*3/uL (ref 4.0–10.5)
nRBC: 0 % (ref 0.0–0.2)

## 2024-02-06 LAB — CMP (CANCER CENTER ONLY)
ALT: 16 U/L (ref 0–44)
AST: 22 U/L (ref 15–41)
Albumin: 4.4 g/dL (ref 3.5–5.0)
Alkaline Phosphatase: 55 U/L (ref 38–126)
Anion gap: 7 (ref 5–15)
BUN: 30 mg/dL — ABNORMAL HIGH (ref 8–23)
CO2: 27 mmol/L (ref 22–32)
Calcium: 9.3 mg/dL (ref 8.9–10.3)
Chloride: 104 mmol/L (ref 98–111)
Creatinine: 1.2 mg/dL — ABNORMAL HIGH (ref 0.44–1.00)
GFR, Estimated: 45 mL/min — ABNORMAL LOW (ref >60)
Glucose, Bld: 83 mg/dL (ref 70–99)
Potassium: 4.6 mmol/L (ref 3.5–5.1)
Sodium: 138 mmol/L (ref 135–145)
Total Bilirubin: 0.7 mg/dL (ref 0.0–1.2)
Total Protein: 6.8 g/dL (ref 6.5–8.1)

## 2024-02-06 LAB — IRON AND IRON BINDING CAPACITY (CC-WL,HP ONLY)
Iron: 102 ug/dL (ref 28–170)
Saturation Ratios: 29 % (ref 10.4–31.8)
TIBC: 358 ug/dL (ref 250–450)
UIBC: 256 ug/dL (ref 148–442)

## 2024-02-06 LAB — RETICULOCYTES
Immature Retic Fract: 10.3 % (ref 2.3–15.9)
RBC.: 3.51 MIL/uL — ABNORMAL LOW (ref 3.87–5.11)
Retic Count, Absolute: 86.7 10*3/uL (ref 19.0–186.0)
Retic Ct Pct: 2.5 % (ref 0.4–3.1)

## 2024-02-06 LAB — FERRITIN: Ferritin: 50 ng/mL (ref 11–307)

## 2024-02-06 NOTE — Progress Notes (Signed)
 Hematology and Oncology Follow Up Visit  Jodi Woods [id number] [birth year] 83 y.o. 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 is here today for follow-up. She is doing quite well but has noted occasional lightheadedness and mild SOB with exertion which she attributes to being deconditioned.  She had a fall a while back which made her nervous so she has not been as active recently.  Thankfully she has not had any new falls or syncope recently.  No fever, chills, n/v, cough, rash, chest pain, palpitations or changes in bladder habits.  She has bouts with abdominal pain and gas that come and go. She is trying to eat better and stay well hydrated. Her weight is 150 lbs.  No swelling in her extremities.  No blood loss, abnormal bruising, or petechiae.  CML was still in MMR back in 06/2023.   ECOG Performance Status: 1 - Symptomatic but completely ambulatory  Medications:  Allergies as of 02/06/2024       Reactions   Amlodipine Besylate Swelling   Swelling in legs   Amoxicillin-pot Clavulanate Diarrhea   Versed [midazolam]    PT does not want any Versed due to memory loss potential         Medication List        Accurate as of February 06, 2024  9:02 AM. If you have any questions, ask your nurse or doctor.          amLODipine 5 MG tablet Commonly known as: NORVASC Take 1 tablet (5 mg total) by mouth daily for hypertenstion   amoxicillin 500 MG capsule Commonly known as: AMOXIL Take 4 capsules (2,000 mg total) by mouth 1 hour prior to dental appointment.   aspirin EC 81 MG tablet Take 81 mg by mouth daily.   atorvastatin 20 MG tablet Commonly known as: LIPITOR Take 20 mg by mouth daily.   atorvastatin 20 MG tablet Commonly known as: LIPITOR Take 1 tablet (20 mg total) by mouth daily.   carvedilol 3.125 MG tablet Commonly known as:  COREG Take 1 tablet (3.125 mg total) by mouth 2 (two) times daily with food for high BP   carvedilol 6.25 MG tablet Commonly known as: COREG Take 6.25 mg by mouth 2 (two) times daily.   carvedilol 6.25 MG tablet Commonly known as: COREG Take 1 tablet (6.25 mg total) by mouth 2 (two) times daily with food.   clindamycin 300 MG capsule Commonly known as: CLEOCIN Take 2 capsules (600 mg total) by mouth as a single dose 30 minutes prior to dental procedure as directed.   famotidine 20 MG tablet Commonly known as: PEPCID Take 20 mg by mouth 2 (two) times daily.   famotidine 20 MG tablet Commonly known as: PEPCID Take 1 tablet (20 mg total) by mouth 2 (two) times daily.   famotidine 20 MG tablet Commonly known as: PEPCID Take 1 tablet (20 mg total) by mouth 2 (two) times daily.   hydrochlorothiazide 25 MG tablet Commonly known as: HYDRODIURIL Take 1 tablet (25 mg total) by mouth daily.   levothyroxine 125 MCG tablet Commonly known as: SYNTHROID Take 1 tablet (125 mcg total) by mouth on an empty stomach every Tuesday, Thursday, Saturday, and Sunday.   levothyroxine 112 MCG tablet Commonly known as: SYNTHROID Take 1 tablet (112 mcg total) by  mouth on an empty stomach every Monday, Wednesday, and Friday.   levothyroxine 125 MCG tablet Commonly known as: SYNTHROID Take 125 mcg by mouth daily before breakfast.   levothyroxine 125 MCG tablet Commonly known as: SYNTHROID Take 1 tablet (125 mcg total) by mouth on an empty stomach every Tuesday, Thursday, Saturday, and Sunday every morning.   levothyroxine 112 MCG tablet Commonly known as: SYNTHROID Take 1 tablet (112 mcg total) by mouth on an empty stomach every Monday, Wednesday, and Friday every morning   multivitamin with minerals Tabs tablet Take 1 tablet by mouth daily.   ondansetron 8 MG tablet Commonly known as: ZOFRAN Take 1 tablet (8 mg total) by mouth every 8 (eight) hours as needed for nausea or vomiting.    polyethylene glycol 17 g packet Commonly known as: MIRALAX / GLYCOLAX Take 17 g by mouth daily as needed for moderate constipation.   sertraline 25 MG tablet Commonly known as: ZOLOFT Take 1 tablet (25 mg total) by mouth in the morning along with 50 mg tablet   spironolactone 25 MG tablet Commonly known as: ALDACTONE Take 12.5 mg by mouth daily.   spironolactone 25 MG tablet Commonly known as: ALDACTONE Take 1 tablet (25 mg total) by mouth daily.   Sprycel 50 MG tablet Generic drug: dasatinib Take 1 tablet (50 mg total) by mouth every other day.   telmisartan 80 MG tablet Commonly known as: MICARDIS Take 80 mg by mouth daily.   Vitamin B12 1000 MCG Tbcr Take 1,000 mcg by mouth 3 (three) times a week.   Vitamin D 125 MCG (5000 UT) Caps Take 5,000 Units by mouth 3 (three) times a week.        Allergies:  Allergies  Allergen Reactions   Amlodipine Besylate Swelling    Swelling in legs   Amoxicillin-Pot Clavulanate Diarrhea   Versed [Midazolam]     PT does not want any Versed due to memory loss potential     Past Medical History, Surgical history, Social history, and Family History were reviewed and updated.  Review of Systems: All other 10 point review of systems is negative.   Physical Exam:  vitals were not taken for this visit.   Wt Readings from Last 3 Encounters:  08/27/23 148 lb 14.4 oz (67.5 kg)  07/17/23 148 lb 1.3 oz (67.2 kg)  01/20/23 161 lb 6.4 oz (73.2 kg)    Ocular: Sclerae unicteric, pupils equal, round and reactive to light Ear-nose-throat: Oropharynx clear, dentition fair Lymphatic: No cervical or supraclavicular adenopathy Lungs no rales or rhonchi, good excursion bilaterally Heart regular rate and rhythm, no murmur appreciated Abd soft, nontender, positive bowel sounds MSK no focal spinal tenderness, no joint edema Neuro: non-focal, well-oriented, appropriate affect Breasts: Deferred   Lab Results  Component Value Date   WBC 5.1  02/06/2024   HGB 11.1 (L) 02/06/2024   HCT 33.6 (L) 02/06/2024   MCV 96.8 02/06/2024   PLT 155 02/06/2024   Lab Results  Component Value Date   FERRITIN 36 01/17/2023   IRON 65 01/17/2023   TIBC 332 01/17/2023   UIBC 267 01/17/2023   IRONPCTSAT 20 01/17/2023   Lab Results  Component Value Date   RETICCTPCT 2.5 02/06/2024   RBC 3.51 (L) 02/06/2024   RBC 3.47 (L) 02/06/2024   No results found for: "KPAFRELGTCHN", "LAMBDASER", "KAPLAMBRATIO" No results found for: "IGGSERUM", "IGA", "IGMSERUM" No results found for: "TOTALPROTELP", "ALBUMINELP", "A1GS", "A2GS", "BETS", "BETA2SER", "GAMS", "MSPIKE", "SPEI"   Chemistry  Component Value Date/Time   NA 140 09/16/2023 1420   K 5.5 (H) 09/16/2023 1420   CL 105 09/16/2023 1420   CO2 23 09/16/2023 1420   BUN 34 (H) 09/16/2023 1420   CREATININE 1.28 (H) 09/16/2023 1420   CREATININE 1.35 (H) 07/17/2023 1147      Component Value Date/Time   CALCIUM 9.3 09/16/2023 1420   ALKPHOS 54 07/17/2023 1147   AST 18 07/17/2023 1147   ALT 14 07/17/2023 1147   BILITOT 0.7 07/17/2023 1147       Impression and Plan: Mrs. Friedland is a very pleasant 83 yo caucasian female with CML. So far she has done well on Sprycel and has remained in MMR.  BCR/ABL today is pending.  No changes to Sprycel at this time.  Follow-up in 6 months.   Eileen Stanford, NP 3/14/20259:02 AM

## 2024-02-07 LAB — ERYTHROPOIETIN: Erythropoietin: 11.8 m[IU]/mL (ref 2.6–18.5)

## 2024-02-09 ENCOUNTER — Other Ambulatory Visit (HOSPITAL_COMMUNITY): Payer: Self-pay

## 2024-02-09 ENCOUNTER — Other Ambulatory Visit (HOSPITAL_BASED_OUTPATIENT_CLINIC_OR_DEPARTMENT_OTHER): Payer: Self-pay

## 2024-02-09 MED ORDER — TELMISARTAN 80 MG PO TABS
80.0000 mg | ORAL_TABLET | Freq: Every day | ORAL | 0 refills | Status: DC
  Filled 2024-02-09 – 2024-02-23 (×2): qty 90, 90d supply, fill #0

## 2024-02-10 ENCOUNTER — Encounter (HOSPITAL_BASED_OUTPATIENT_CLINIC_OR_DEPARTMENT_OTHER): Payer: Self-pay | Admitting: Emergency Medicine

## 2024-02-10 ENCOUNTER — Emergency Department (HOSPITAL_BASED_OUTPATIENT_CLINIC_OR_DEPARTMENT_OTHER)

## 2024-02-10 DIAGNOSIS — M542 Cervicalgia: Secondary | ICD-10-CM | POA: Insufficient documentation

## 2024-02-10 DIAGNOSIS — R55 Syncope and collapse: Secondary | ICD-10-CM | POA: Insufficient documentation

## 2024-02-10 DIAGNOSIS — Z7982 Long term (current) use of aspirin: Secondary | ICD-10-CM | POA: Insufficient documentation

## 2024-02-10 DIAGNOSIS — S0990XA Unspecified injury of head, initial encounter: Secondary | ICD-10-CM | POA: Diagnosis not present

## 2024-02-10 DIAGNOSIS — I10 Essential (primary) hypertension: Secondary | ICD-10-CM | POA: Insufficient documentation

## 2024-02-10 DIAGNOSIS — R519 Headache, unspecified: Secondary | ICD-10-CM | POA: Insufficient documentation

## 2024-02-10 DIAGNOSIS — S0003XA Contusion of scalp, initial encounter: Secondary | ICD-10-CM | POA: Diagnosis not present

## 2024-02-10 DIAGNOSIS — W01198A Fall on same level from slipping, tripping and stumbling with subsequent striking against other object, initial encounter: Secondary | ICD-10-CM | POA: Diagnosis not present

## 2024-02-10 DIAGNOSIS — Z79899 Other long term (current) drug therapy: Secondary | ICD-10-CM | POA: Insufficient documentation

## 2024-02-10 LAB — CBC
HCT: 33.1 % — ABNORMAL LOW (ref 36.0–46.0)
Hemoglobin: 11 g/dL — ABNORMAL LOW (ref 12.0–15.0)
MCH: 31.7 pg (ref 26.0–34.0)
MCHC: 33.2 g/dL (ref 30.0–36.0)
MCV: 95.4 fL (ref 80.0–100.0)
Platelets: 160 10*3/uL (ref 150–400)
RBC: 3.47 MIL/uL — ABNORMAL LOW (ref 3.87–5.11)
RDW: 13.5 % (ref 11.5–15.5)
WBC: 6.2 10*3/uL (ref 4.0–10.5)
nRBC: 0 % (ref 0.0–0.2)

## 2024-02-10 LAB — COMPREHENSIVE METABOLIC PANEL
ALT: 16 U/L (ref 0–44)
AST: 28 U/L (ref 15–41)
Albumin: 4.4 g/dL (ref 3.5–5.0)
Alkaline Phosphatase: 55 U/L (ref 38–126)
Anion gap: 7 (ref 5–15)
BUN: 30 mg/dL — ABNORMAL HIGH (ref 8–23)
CO2: 25 mmol/L (ref 22–32)
Calcium: 9.4 mg/dL (ref 8.9–10.3)
Chloride: 105 mmol/L (ref 98–111)
Creatinine, Ser: 1.15 mg/dL — ABNORMAL HIGH (ref 0.44–1.00)
GFR, Estimated: 47 mL/min — ABNORMAL LOW (ref >60)
Glucose, Bld: 110 mg/dL — ABNORMAL HIGH (ref 70–99)
Potassium: 4.1 mmol/L (ref 3.5–5.1)
Sodium: 137 mmol/L (ref 135–145)
Total Bilirubin: 0.5 mg/dL (ref 0.0–1.2)
Total Protein: 6.7 g/dL (ref 6.5–8.1)

## 2024-02-10 NOTE — ED Triage Notes (Signed)
 Was not feeling well, was standing to get a drink and woke up on the floor,  Unwitnessed  Unsure how long she was down Pain in back of head  No blood thinner or ASA  Happened around 8pm  Reports a stiff neck that is not new

## 2024-02-11 ENCOUNTER — Emergency Department (HOSPITAL_BASED_OUTPATIENT_CLINIC_OR_DEPARTMENT_OTHER)
Admission: EM | Admit: 2024-02-11 | Discharge: 2024-02-11 | Disposition: A | Discharge status: Home / Self Care | Attending: Emergency Medicine | Admitting: Emergency Medicine

## 2024-02-11 DIAGNOSIS — S0990XA Unspecified injury of head, initial encounter: Secondary | ICD-10-CM

## 2024-02-11 DIAGNOSIS — R55 Syncope and collapse: Secondary | ICD-10-CM | POA: Diagnosis present

## 2024-02-11 LAB — TROPONIN I (HIGH SENSITIVITY): Troponin I (High Sensitivity): 10 ng/L (ref <18)

## 2024-02-11 MED ORDER — ACETAMINOPHEN 325 MG PO TABS
650.0000 mg | ORAL_TABLET | Freq: Once | ORAL | Status: AC
  Administered 2024-02-11: 650 mg via ORAL
  Filled 2024-02-11: qty 2

## 2024-02-11 MED ORDER — LACTATED RINGERS IV BOLUS
1000.0000 mL | Freq: Once | INTRAVENOUS | Status: AC
  Administered 2024-02-11: 1000 mL via INTRAVENOUS

## 2024-02-11 NOTE — ED Notes (Signed)
 DC instructions reviewed with pt no questions or concerns. Pt declined wheelchair. Ambulated with steady gait out of ed with son and friend.

## 2024-02-11 NOTE — ED Provider Notes (Signed)
 [place] EMERGENCY DEPARTMENT AT [facility] Provider Note   CSN: [id number] Arrival date & time: 02/10/24  2103     History  Chief Complaint  Patient presents with   Fall   Loss of Consciousness    Jodi Woods is a 83 y.o. female.  Patient with a history of CML in remission, hypertension, hyperlipidemia, arthritis presents with episode of syncope.  States she was "not feeling well" which she describes as "foggy and lethargy".  She was bending over the freezer to get a drink in the next thing she knows she was on the ground.  Does not know what happened.  Denies any preceding dizziness, lightheadedness, nausea, vomiting, chest pain, shortness of breath.  No blurry vision or double vision.  Has passed out in the past but normally had a prodrome.  Not have a prodrome today.  Did hit her head.  Complains of stiffness to her neck but this is a chronic issue for her.  Complains of worsening pain in her head and neck after falling.  No blood thinner use.  No chest pain or shortness of breath.  No abdominal pain, pain with urination, blood in the urine.  The history is provided by the patient and a relative.  Fall Associated symptoms include headaches. Pertinent negatives include no chest pain, no abdominal pain and no shortness of breath.  Loss of Consciousness Associated symptoms: dizziness and headaches   Associated symptoms: no chest pain, no fever, no nausea, no shortness of breath and no vomiting        Home Medications Prior to Admission medications   Medication Sig Start Date End Date Taking? Authorizing Provider  amLODipine (NORVASC) 5 MG tablet Take 1 tablet (5 mg total) by mouth daily for hypertenstion 11/25/23     amoxicillin (AMOXIL) 500 MG capsule Take 4 capsules (2,000 mg total) by mouth 1 hour prior to dental appointment. Patient not taking: Reported on 02/06/2024 03/14/23     aspirin EC 81 MG tablet Take 81 mg by mouth daily. 04/30/22   [provider]   atorvastatin (LIPITOR) 20 MG tablet Take 1 tablet (20 mg total) by mouth daily. 08/19/23     Cholecalciferol (VITAMIN D) 125 MCG (5000 UT) CAPS Take 5,000 Units by mouth 3 (three) times a week.    [provider]  clindamycin (CLEOCIN) 300 MG capsule Take 2 capsules (600 mg total) by mouth as a single dose 30 minutes prior to dental procedure as directed. Patient not taking: Reported on 02/06/2024 03/21/23     Cyanocobalamin (VITAMIN B12) 1000 MCG TBCR Take 1,000 mcg by mouth 3 (three) times a week.    [provider]  famotidine (PEPCID) 20 MG tablet Take 1 tablet (20 mg total) by mouth 2 (two) times daily. 06/12/23     hydrochlorothiazide (HYDRODIURIL) 25 MG tablet Take 1 tablet (25 mg total) by mouth daily. 10/06/23     levothyroxine (SYNTHROID) 112 MCG tablet Take 1 tablet (112 mcg total) by mouth on an empty stomach every Monday, Wednesday, and Friday. 07/04/23     levothyroxine (SYNTHROID) 125 MCG tablet Take 1 tablet (125 mcg total) by mouth on an empty stomach every Tuesday, Thursday, Saturday, and Sunday every morning. 08/19/23     Multiple Vitamin (MULTIVITAMIN WITH MINERALS) TABS tablet Take 1 tablet by mouth daily.    [provider]  polyethylene glycol (MIRALAX / GLYCOLAX) 17 g packet Take 17 g by mouth daily as needed for moderate constipation. Patient not taking: Reported  on 02/06/2024    [provider]  sertraline (ZOLOFT) 25 MG tablet Take 1 tablet (25 mg total) by mouth in the morning along with 50 mg tablet 11/25/23     sertraline (ZOLOFT) 50 MG tablet Take 50 mg by mouth every morning. 11/28/23   [provider]  SPRYCEL 50 MG tablet Take 1 tablet (50 mg total) by mouth every other day. 12/03/23   Josph Macho, MD  telmisartan (MICARDIS) 80 MG tablet Take 80 mg by mouth daily. 08/19/23   [provider]  telmisartan (MICARDIS) 80 MG tablet Take 1 tablet (80 mg total) by mouth daily. 02/09/24         Allergies    Versed  [midazolam], Amlodipine besylate, and Amoxicillin-pot clavulanate    Review of Systems   Review of Systems  Constitutional:  Positive for fatigue. Negative for activity change, appetite change and fever.  HENT:  Negative for congestion and rhinorrhea.   Respiratory:  Negative for cough, chest tightness and shortness of breath.   Cardiovascular:  Positive for syncope. Negative for chest pain.  Gastrointestinal:  Negative for abdominal pain, nausea and vomiting.  Genitourinary:  Negative for dysuria and pelvic pain.  Musculoskeletal:  Negative for arthralgias and myalgias.  Skin:  Negative for rash.  Neurological:  Positive for dizziness, syncope and headaches.   all other systems are negative except as noted in the HPI and PMH.    Physical Exam Updated Vital Signs BP (!) 177/84 (BP Location: Right Arm)   Pulse 67   Temp 98 F (36.7 C)   Resp 17   SpO2 100%  Physical Exam Vitals and nursing note reviewed.  Constitutional:      General: She is not in acute distress.    Appearance: She is well-developed.  HENT:     Head: Normocephalic and atraumatic.     Mouth/Throat:     Pharynx: No oropharyngeal exudate.  Eyes:     Conjunctiva/sclera: Conjunctivae normal.     Pupils: Pupils are equal, round, and reactive to light.  Neck:     Comments: Paraspinal cervical tenderness, no midline tenderness Cardiovascular:     Rate and Rhythm: Normal rate and regular rhythm.     Heart sounds: Normal heart sounds. No murmur heard. Pulmonary:     Effort: Pulmonary effort is normal. No respiratory distress.     Breath sounds: Normal breath sounds.  Abdominal:     Palpations: Abdomen is soft.     Tenderness: There is no abdominal tenderness. There is no guarding or rebound.  Musculoskeletal:        General: No tenderness. Normal range of motion.     Cervical back: Normal range of motion and neck supple.  Skin:    General: Skin is warm.  Neurological:     Mental Status: She is alert and  oriented to person, place, and time.     Cranial Nerves: No cranial nerve deficit.     Motor: No abnormal muscle tone.     Coordination: Coordination normal.     Comments:  5/5 strength throughout. CN 2-12 intact.Equal grip strength.   Psychiatric:        Behavior: Behavior normal.     ED Results / Procedures / Treatments   Labs (all labs ordered are listed, but only abnormal results are displayed) Labs Reviewed  COMPREHENSIVE METABOLIC PANEL - Abnormal; Notable for the following components:      Result Value   Glucose, Bld 110 (*)  BUN 30 (*)    Creatinine, Ser 1.15 (*)    GFR, Estimated 47 (*)    All other components within normal limits  CBC - Abnormal; Notable for the following components:   RBC 3.47 (*)    Hemoglobin 11.0 (*)    HCT 33.1 (*)    All other components within normal limits  TROPONIN I (HIGH SENSITIVITY)    EKG EKG Interpretation Date/Time:  Tuesday February 10 2024 21:27:48 EDT Ventricular Rate:  57 PR Interval:  158 QRS Duration:  122 QT Interval:  452 QTC Calculation: 439 R Axis:   -56  Text Interpretation: Sinus bradycardia Left anterior fascicular block Left ventricular hypertrophy with QRS widening ( R in aVL , Cornell product , Romhilt-Estes ) Abnormal ECG When compared with ECG of 08-Jan-2023 13:50, Incomplete right bundle branch block is no longer Present No significant change was found Confirmed by Glynn Octave [phone]) on 02/11/2024 2:51:24 AM  Radiology CT Head Wo Contrast Result Date: 02/10/2024 CLINICAL DATA:  Unwitnessed fall. Unsure of how long on floor. Pain in back of head. EXAM: CT HEAD WITHOUT CONTRAST CT CERVICAL SPINE WITHOUT CONTRAST TECHNIQUE: Multidetector CT imaging of the head and cervical spine was performed following the standard protocol without intravenous contrast. Multiplanar CT image reconstructions of the cervical spine were also generated. RADIATION DOSE REDUCTION: This exam was performed according to the departmental  dose-optimization program which includes automated exposure control, adjustment of the mA and/or kV according to patient size and/or use of iterative reconstruction technique. COMPARISON:  CT head 04/19/2022 and CT neck 01/26/2021 FINDINGS: CT HEAD FINDINGS Brain: No intracranial hemorrhage, mass effect, or evidence of acute infarct. No hydrocephalus. No extra-axial fluid collection. Vascular: No hyperdense vessel or unexpected calcification. Skull: No fracture or focal lesion.  Right posterior scalp hematoma. Sinuses/Orbits: No acute finding. Paranasal sinuses and mastoid air cells are well aerated. Other: None. CT CERVICAL SPINE FINDINGS Alignment: No evidence of traumatic malalignment. Skull base and vertebrae: No acute fracture. No primary bone lesion or focal pathologic process. Soft tissues and spinal canal: No prevertebral fluid or swelling. No visible canal hematoma. Disc levels: Mild multilevel spondylosis. Posterior disc osteophyte complex at C5-C6 cause mild effacement of the ventral thecal sac. Mild multilevel facet arthropathy. Upper chest: Negative. Other: None. IMPRESSION: No acute intracranial abnormality. No cervical spine fracture. Electronically Signed   By: Minerva Fester M.D.   On: 02/10/2024 23:25   CT Cervical Spine Wo Contrast Result Date: 02/10/2024 CLINICAL DATA:  Unwitnessed fall. Unsure of how long on floor. Pain in back of head. EXAM: CT HEAD WITHOUT CONTRAST CT CERVICAL SPINE WITHOUT CONTRAST TECHNIQUE: Multidetector CT imaging of the head and cervical spine was performed following the standard protocol without intravenous contrast. Multiplanar CT image reconstructions of the cervical spine were also generated. RADIATION DOSE REDUCTION: This exam was performed according to the departmental dose-optimization program which includes automated exposure control, adjustment of the mA and/or kV according to patient size and/or use of iterative reconstruction technique. COMPARISON:  CT head  04/19/2022 and CT neck 01/26/2021 FINDINGS: CT HEAD FINDINGS Brain: No intracranial hemorrhage, mass effect, or evidence of acute infarct. No hydrocephalus. No extra-axial fluid collection. Vascular: No hyperdense vessel or unexpected calcification. Skull: No fracture or focal lesion.  Right posterior scalp hematoma. Sinuses/Orbits: No acute finding. Paranasal sinuses and mastoid air cells are well aerated. Other: None. CT CERVICAL SPINE FINDINGS Alignment: No evidence of traumatic malalignment. Skull base and vertebrae: No acute fracture. No primary bone lesion or  focal pathologic process. Soft tissues and spinal canal: No prevertebral fluid or swelling. No visible canal hematoma. Disc levels: Mild multilevel spondylosis. Posterior disc osteophyte complex at C5-C6 cause mild effacement of the ventral thecal sac. Mild multilevel facet arthropathy. Upper chest: Negative. Other: None. IMPRESSION: No acute intracranial abnormality. No cervical spine fracture. Electronically Signed   By: Minerva Fester M.D.   On: 02/10/2024 23:25    Procedures Procedures    Medications Ordered in ED Medications  lactated ringers bolus 1,000 mL (has no administration in time range)    ED Course/ Medical Decision Making/ A&P                                 Medical Decision Making Amount and/or Complexity of Data Reviewed Labs: ordered. Decision-making details documented in ED Course. Radiology: ordered and independent interpretation performed. Decision-making details documented in ED Course. ECG/medicine tests: ordered and independent interpretation performed. Decision-making details documented in ED Course.  Risk Decision regarding hospitalization.   Syncopal episode and head injury.  No prodrome.  Stable vitals.  Complains of headache.  Neurologically intact.  EKG sinus bradycardia, no prolonged QT, no Brugada.  CT head and C-spine negative for traumatic imaging.  Orthostatics are negative.  Remains  hypertensive.  No tachycardia.  Labs reassuring with stable creatinine and hemoglobin.  Troponin negative.  Low suspicion for ACS or PE.  Concern for cardiogenic cause of syncope given her lack of prodrome and syncopal episode.  Last echocardiogram in 2021.  EKG without acute ischemia, no prolonged QT, no Brugada.  IV fluids given.  Observation admission discussed with Dr Loney Loh.         Final Clinical Impression(s) / ED Diagnoses Final diagnoses:  None    Rx / DC Orders ED Discharge Orders     None         Tayia Stonesifer, Jeannett Senior, MD 02/11/24 [phone]

## 2024-02-11 NOTE — Plan of Care (Signed)
 Plan of Care Note for accepted transfer   Patient name: Jodi Woods WUJ:[id number] DOB: 04/15/1941  Facility requesting transfer: [facility] Requesting Provider: Dr. Manus Gunning Reason for transfer: Syncope Facility course: 83 year old female with history of CML, hypertension, hypothyroidism, hyperlipidemia, CVA, hypothyroidism presented to the ED after a syncopal event.  Patient slightly bradycardic with heart rate in the 50s and hypertensive with systolic up to 180s.  EKG showing sinus bradycardia and no acute ischemic changes.  Labs notable for hemoglobin 11.0 (stable), troponin negative x 1.  CT head/C-spine negative for acute findings. Patient was given 1 L IV fluids.  Plan of care: The patient is accepted for admission to Progressive unit at [facility].  [facility] will assume care on arrival to accepting facility. Until arrival, care as per EDP. However, [facility] available 24/7 for questions and assistance.  Check [email or web address] for on-call coverage.  Nursing staff, please call [facility] Admits & Consults System-Wide number under Amion on patient's arrival so appropriate admitting provider can evaluate the pt.

## 2024-02-11 NOTE — ED Provider Notes (Signed)
  Physical Exam  BP (!) 156/66   Pulse 67   Temp 98.2 F (36.8 C) (Oral)   Resp 16   SpO2 99%   Physical Exam Vitals and nursing note reviewed.  Constitutional:      General: She is not in acute distress.    Appearance: She is well-developed.  HENT:     Head: Normocephalic and atraumatic.  Eyes:     Conjunctiva/sclera: Conjunctivae normal.  Cardiovascular:     Rate and Rhythm: Normal rate and regular rhythm.     Heart sounds: No murmur heard. Pulmonary:     Effort: Pulmonary effort is normal. No respiratory distress.     Breath sounds: Normal breath sounds.  Abdominal:     Palpations: Abdomen is soft.     Tenderness: There is no abdominal tenderness.  Musculoskeletal:        General: No swelling.     Cervical back: Neck supple.  Skin:    General: Skin is warm and dry.     Capillary Refill: Capillary refill takes less than 2 seconds.  Neurological:     Mental Status: She is alert.  Psychiatric:        Mood and Affect: Mood normal.     Procedures  Procedures  ED Course / MDM    Medical Decision Making Amount and/or Complexity of Data Reviewed Labs: ordered. Radiology: ordered.  Risk OTC drugs.   Patient received in handoff.  Admitted for high risk syncope and need for echo.  This morning, patient states she is feeling improved and does not want to be admitted to the hospital anymore.  Would like to be discharged with outpatient follow-up.  She is not orthostatic here in the emergency room and and has had no arrhythmia events overnight.  Risks and benefits of leaving the emergency department were discussed with the patient and she voiced understanding of this.  I placed an outpatient cardiology referral for syncope evaluation.  Given return precautions of which she voiced understanding she was discharged       Glendora Score, MD 02/11/24 1002

## 2024-02-11 NOTE — ED Notes (Signed)
 Pt provided with cereal, applesauce, coffee and juice. Denies any other needs at this time. Call bell in reach

## 2024-02-11 NOTE — ED Notes (Signed)
 Pt alert and oriented. Pt ambulated to bathroom in room. Bed linens changed. Pt placed in gown and brief applied. Denies any other needs at this time. Call bell in reach.

## 2024-02-12 ENCOUNTER — Telehealth: Payer: Self-pay

## 2024-02-12 LAB — BCR-ABL1, CML/ALL, PCR, QUANT
E1A2 Transcript: <0.0032 %
Interpretation (BCRAL):: NEGATIVE
b2a2 transcript: <0.0032 %
b3a2 transcript: <0.0032 %

## 2024-02-12 NOTE — Telephone Encounter (Signed)
-----   Message from Josph Macho sent at 02/12/2024 11:43 AM EDT ----- Please call and let her know that the leukemia is in remission.  Cindee Lame

## 2024-02-12 NOTE — Telephone Encounter (Signed)
 Advised via MyChart.

## 2024-02-16 ENCOUNTER — Ambulatory Visit (HOSPITAL_BASED_OUTPATIENT_CLINIC_OR_DEPARTMENT_OTHER): Admitting: Family

## 2024-02-16 ENCOUNTER — Other Ambulatory Visit (HOSPITAL_BASED_OUTPATIENT_CLINIC_OR_DEPARTMENT_OTHER)

## 2024-02-16 VITALS — BP 163/77 | HR 69 | Ht 61.0 in | Wt 148.0 lb

## 2024-02-16 DIAGNOSIS — I10 Essential (primary) hypertension: Secondary | ICD-10-CM | POA: Diagnosis not present

## 2024-02-16 DIAGNOSIS — I700 Atherosclerosis of aorta: Secondary | ICD-10-CM | POA: Diagnosis not present

## 2024-02-16 DIAGNOSIS — E782 Mixed hyperlipidemia: Secondary | ICD-10-CM | POA: Diagnosis not present

## 2024-02-16 DIAGNOSIS — R55 Syncope and collapse: Secondary | ICD-10-CM | POA: Diagnosis not present

## 2024-02-16 NOTE — Progress Notes (Signed)
 Cardiology Office Note:  .   Date:  02/16/2024  ID:  Jodi Woods, DOB 1940/12/01, MRN [id number] PCP: Lewis Moccasin, MD  [facility] Providers Cardiologist:  None    History of Present Illness: .   Jodi Woods is a 83 y.o. female with history of hypertension, hyperlipidemia, hypothyroidism, GERD, ANA positive, systemic lupus erythematosus, chronic myelocytic leukemia, retinal aneurysm, syncope.  She established with Dr. Duke Salvia in [facility] 08/2023.  Prior echo 2021 LVEF 60 to 65%, normal diastolic parameters, moderately elevated PASP 51 mmHg, trivial AI without stenosis.  Carvedilol was continued.  Spironolactone added.  She was encouraged to consider Right Start exercise program after completing physical therapy for her knee after surgery 12/2022.  Presents today for ED visit after syncope. On Tuesday was not feeling well starting in the afternoon. She fell asleep in her bed around 3pm. She got up at 7pm and walked to her computer. Then she opened the freezer on the bottom of her fridge to get an ice tray. Then suddenly she was laying in the middle of the floor facing away from the freezer.  No prodrome such as feeling lightheaded or dizzy.  Hard to know how long she lost consciousness for. Does note she was been working to increase her walking. Blood pressure at home is "all over the place" and has been most often 130-150s with occasional readings in the 120s. Eats 2 meals per day and snacks. Does not drink much water. Drinks one cup of decaf coffee in the morning, rare soft drink, some juice/tea.   Orthostatic vitals today in clinic supine 176/78 heart rate 59 ? sitting 155/74 heart rate 66 ? standing 0 minutes 152/73 heart rate 68 ? standing 3 minutes 163/77 heart rate 69.  Not consistent with orthostatic hypotension.   Previous antihypertensives Amlodipine - edema Liusinopril Thiazide - AKI Spironolactone -  PCP discontinued (was reduced by cardiology  due to hyperkalemia, anticipate PCP team discontinued due to persistent hyperkalemia)   ROS: Please see the history of present illness.    All other systems reviewed and are negative.   Studies Reviewed: .        Cardiac Studies & Procedures   ______________________________________________________________________________________________     ECHOCARDIOGRAM  ECHOCARDIOGRAM COMPLETE 02/23/2020  Narrative ECHOCARDIOGRAM REPORT    Patient Name:   Jodi Woods Date of Exam: 02/23/2020 Medical Rec #:  [id number]      Height:       62.0 in Accession #:    [id number]     Weight:       162.0 lb Date of Birth:  07-13-1941      BSA:          1.748 m Patient Age:    78 years       BP:           162/59 mmHg Patient Gender: F              HR:           73 bpm. Exam Location:  [address]  Procedure: 2D Echo, 3D Echo, Color Doppler, Cardiac Doppler and Strain Analysis  Indications:    C92.10 Chronic myelocytic leukemia  History:        Patient has no prior history of Echocardiogram examinations. Anemia. Leukemia.  Sonographer:    Garald Braver, RDCS Referring Phys: [id number] PETER R ENNEVER  IMPRESSIONS   1. Left ventricular ejection fraction, by estimation, is 60 to 65%. The left ventricle  has normal function. The left ventricle has no regional wall motion abnormalities. Left ventricular diastolic parameters were normal. 2. Right ventricular systolic function is normal. The right ventricular size is normal. There is moderately elevated pulmonary artery systolic pressure. The estimated right ventricular systolic pressure is 51.0 mmHg. 3. The mitral valve is normal in structure. No evidence of mitral valve regurgitation. No evidence of mitral stenosis. 4. The aortic valve is normal in structure. Aortic valve regurgitation is trivial. No aortic stenosis is present. 5. The inferior vena cava is normal in size with greater than 50% respiratory variability, suggesting right atrial pressure of  3 mmHg.  Comparison(s): No prior Echocardiogram.  FINDINGS Left Ventricle: Left ventricular ejection fraction, by estimation, is 60 to 65%. The left ventricle has normal function. The left ventricle has no regional wall motion abnormalities. The left ventricular internal cavity size was normal in size. There is no left ventricular hypertrophy. Left ventricular diastolic parameters were normal. Normal left ventricular filling pressure.  Right Ventricle: The right ventricular size is normal. No increase in right ventricular wall thickness. Right ventricular systolic function is normal. There is moderately elevated pulmonary artery systolic pressure. The tricuspid regurgitant velocity is 3.28 m/s, and with an assumed right atrial pressure of 8 mmHg, the estimated right ventricular systolic pressure is 51.0 mmHg.  Left Atrium: Left atrial size was normal in size.  Right Atrium: Right atrial size was normal in size.  Pericardium: There is no evidence of pericardial effusion.  Mitral Valve: The mitral valve is normal in structure. Normal mobility of the mitral valve leaflets. No evidence of mitral valve regurgitation. No evidence of mitral valve stenosis.  Tricuspid Valve: The tricuspid valve is normal in structure. Tricuspid valve regurgitation is mild . No evidence of tricuspid stenosis.  Aortic Valve: The aortic valve is normal in structure. Aortic valve regurgitation is trivial. Aortic regurgitation PHT measures 363 msec. No aortic stenosis is present.  Pulmonic Valve: The pulmonic valve was normal in structure. Pulmonic valve regurgitation is not visualized. No evidence of pulmonic stenosis.  Aorta: The aortic root is normal in size and structure.  Venous: The inferior vena cava is normal in size with greater than 50% respiratory variability, suggesting right atrial pressure of 3 mmHg.  IAS/Shunts: No atrial level shunt detected by color flow Doppler.   LEFT VENTRICLE PLAX 2D LVIDd:          5.10 cm  Diastology LVIDs:         3.30 cm  LV e' lateral:   9.25 cm/s LV PW:         0.80 cm  LV E/e' lateral: 14.1 LV IVS:        1.00 cm  LV e' medial:    8.70 cm/s LVOT diam:     1.90 cm  LV E/e' medial:  14.9 LV SV:         78 LV SV Index:   45       2D Longitudinal Strain LVOT Area:     2.84 cm 2D Strain GLS (A2C):   -23.7 % 2D Strain GLS (A3C):   -27.0 % 2D Strain GLS (A4C):   -24.1 % 2D Strain GLS Avg:     -24.9 %  3D Volume EF: 3D EF:        66 % LV EDV:       115 ml LV ESV:       39 ml LV SV:        75 ml  RIGHT VENTRICLE RV Basal diam:  3.10 cm RV S prime:     20.00 cm/s TAPSE (M-mode): 2.6 cm RVSP:           51.0 mmHg  LEFT ATRIUM             Index       RIGHT ATRIUM           Index LA diam:        4.00 cm 2.29 cm/m  RA Pressure: 8.00 mmHg LA Vol (A2C):   54.0 ml 30.89 ml/m RA Area:     15.10 cm LA Vol (A4C):   60.1 ml 34.38 ml/m RA Volume:   34.20 ml  19.57 ml/m LA Biplane Vol: 59.1 ml 33.81 ml/m AORTIC VALVE LVOT Vmax:   107.00 cm/s LVOT Vmean:  77.100 cm/s LVOT VTI:    0.276 m AI PHT:      363 msec  AORTA Ao Root diam: 2.90 cm Ao Asc diam:  3.10 cm  MITRAL VALVE                TRICUSPID VALVE TR Peak grad:   43.0 mmHg TR Vmax:        328.00 cm/s MV E velocity: 130.00 cm/s  Estimated RAP:  8.00 mmHg MV A velocity: 124.00 cm/s  RVSP:           51.0 mmHg MV E/A ratio:  1.05 SHUNTS Systemic VTI:  0.28 m Systemic Diam: 1.90 cm  Mihai Croitoru MD Electronically signed by Thurmon Fair MD Signature Date/Time: 02/23/2020/3:53:40 PM    Final          ______________________________________________________________________________________________      Risk Assessment/Calculations:            Physical Exam:   VS:  BP (!) 163/77 (Patient Position: Standing)   Pulse 69   Ht 5\' 1"  (1.549 m)   Wt 148 lb (67.1 kg)   SpO2 96%   BMI 27.96 kg/m    Wt Readings from Last 3 Encounters:  02/16/24 148 lb (67.1 kg)  02/06/24 150 lb (68  kg)  08/27/23 148 lb 14.4 oz (67.5 kg)    GEN: Well nourished, well developed in no acute distress NECK: No JVD; No carotid bruits CARDIAC: RRR, no murmurs, rubs, gallops RESPIRATORY:  Clear to auscultation without rales, wheezing or rhonchi  ABDOMEN: Soft, non-tender, non-distended EXTREMITIES:  No edema; No deformity   ASSESSMENT AND PLAN: .    Syncope - episode of syncope while bending over into freezer with no prodrome.  Blood work in ED unremarkable. Plan for echocardiogram to rule out structural abnormality Plan for carotid duplex to rule out stenosis Plan for ZIO monitor to rule out arrhythmias.  Placed in clinic.  HTN -BP at home 130-150s with occasional readings in the 120s.  Hesitant to overtreat given recent syncope.  Encouraged her to check blood pressure once per day after sitting and resting 5 to 10 minutes and will readdress at follow-up.  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  Continue aspirin 81 mg daily, atorvastatin 20 mg daily. Recommend aiming for 150 minutes of moderate intensity activity per week and following a heart healthy diet.         Dispo: follow up in 2-3 months  Signed, Alver Sorrow, NP

## 2024-02-16 NOTE — Patient Instructions (Addendum)
 Medication Instructions:  Your physician recommends that you continue on your current medications as directed. Please refer to the Current Medication list given to you today.  *If you need a refill on your cardiac medications before your next appointment, please call your pharmacy*  Testing/Procedures: Your physician has requested that you have an echocardiogram. Echocardiography is a painless test that uses sound waves to create images of your heart. It provides your doctor with information about the size and shape of your heart and how well your heart's chambers and valves are working. This procedure takes approximately one hour. There are no restrictions for this procedure. Please do NOT wear cologne, perfume, aftershave, or lotions (deodorant is allowed). Please arrive 15 minutes prior to your appointment time.  Please note: We ask at that you not bring children with you during ultrasound (echo/ vascular) testing. Due to room size and safety concerns, children are not allowed in the ultrasound rooms during exams. Our front office staff cannot provide observation of children in our lobby area while testing is being conducted. An adult accompanying a patient to their appointment will only be allowed in the ultrasound room at the discretion of the ultrasound technician under special circumstances. We apologize for any inconvenience.  Your physician has requested that you have a carotid duplex. This test is an ultrasound of the carotid arteries in your neck. It looks at blood flow through these arteries that supply the brain with blood. Allow one hour for this exam. There are no restrictions or special instructions.  Your physician has recommended that you wear a Zio AT Live monitor.   This monitor is a medical device that records the heart's electrical activity. Doctors most often use these monitors to diagnose arrhythmias. Arrhythmias are problems with the speed or rhythm of the heartbeat. The  monitor is a small device applied to your chest. You can wear one while you do your normal daily activities. While wearing this monitor if you have any symptoms to push the button and record what you felt. Once you have worn this monitor for the period of time provider prescribed (Usually 14 days), you will return the monitor device in the postage paid box/bag. Once it is returned they will download the data collected and provide [place] with a report which the provider will then review and we will call you with those results.   Important tips:  Avoid showering during the first 24 hours of wearing the monitor. Avoid excessive sweating to help maximize wear time. Do not submerge the device, no hot tubs, and no swimming pools. Keep any lotions or oils away from the patch. After 24 hours you may shower with the patch on. Take brief showers with your back facing the shower head.  Do not remove patch once it has been placed because that will interrupt data and decrease adhesive wear time. Push the button when you have any symptoms and write down what you were feeling. Once you have completed wearing your monitor, remove and place into box which has postage paid and place in your outgoing mailbox.  If for some reason you have misplaced your box then call our office and we can provide another box and/or mail it off for you. Keep the transmitter within 10 feet at all times.  Expect a welcome phone call within 24-48 hrs of application from Zio.  This call will include your copay information, so please answer any unknown phone calls while wearing Zio (it could also be important information  about your heart) The envelope to return Zio is in the back of the transmitter. Removal instructions are on the last page of the symptom diary.  Place the patch sticky side up inside the transmitter and the symptom diary inside the envelope to return on your last wear day inside your mailbox or any USPS  mailbox.    Follow-Up: At [facility], you and your health needs are our priority.  As part of our continuing mission to provide you with exceptional heart care, we have created designated Provider Care Teams.  These Care Teams include your primary Cardiologist (physician) and Advanced Practice Providers (APPs -  Physician Assistants and Nurse Practitioners) who all work together to provide you with the care you need, when you need it.  We recommend signing up for the patient portal called "MyChart".  Sign up information is provided on this After Visit Summary.  MyChart is used to connect with patients for Virtual Visits (Telemedicine).  Patients are able to view lab/test results, encounter notes, upcoming appointments, etc.  Non-urgent messages can be sent to your provider as well.   To learn more about what you can do with MyChart, go to [email or web address].    Your next appointment:   2 months in [facility]V [facility]N CLINIC with Dr. Duke Salvia   Other Instructions [facility] at [facility] [address] [place],  [place]  [address] Main: [phone]

## 2024-02-17 DIAGNOSIS — R55 Syncope and collapse: Secondary | ICD-10-CM | POA: Diagnosis not present

## 2024-02-17 DIAGNOSIS — E860 Dehydration: Secondary | ICD-10-CM | POA: Diagnosis not present

## 2024-02-17 DIAGNOSIS — H6121 Impacted cerumen, right ear: Secondary | ICD-10-CM | POA: Diagnosis not present

## 2024-02-17 DIAGNOSIS — I10 Essential (primary) hypertension: Secondary | ICD-10-CM | POA: Diagnosis not present

## 2024-02-19 ENCOUNTER — Other Ambulatory Visit (HOSPITAL_COMMUNITY): Payer: Self-pay

## 2024-02-22 ENCOUNTER — Encounter (HOSPITAL_BASED_OUTPATIENT_CLINIC_OR_DEPARTMENT_OTHER): Payer: Self-pay | Admitting: Family

## 2024-02-23 ENCOUNTER — Other Ambulatory Visit (HOSPITAL_BASED_OUTPATIENT_CLINIC_OR_DEPARTMENT_OTHER): Payer: Self-pay

## 2024-02-23 ENCOUNTER — Other Ambulatory Visit: Payer: Self-pay

## 2024-03-03 ENCOUNTER — Other Ambulatory Visit: Payer: Self-pay

## 2024-03-03 ENCOUNTER — Other Ambulatory Visit: Payer: Self-pay | Admitting: Hematology & Oncology

## 2024-03-03 DIAGNOSIS — C9210 Chronic myeloid leukemia, BCR/ABL-positive, not having achieved remission: Secondary | ICD-10-CM

## 2024-03-03 MED ORDER — SPRYCEL 50 MG PO TABS
50.0000 mg | ORAL_TABLET | ORAL | 2 refills | Status: DC
  Filled 2024-03-03: qty 15, 30d supply, fill #0
  Filled 2024-04-12 – 2024-06-02 (×2): qty 15, 30d supply, fill #1

## 2024-03-03 NOTE — Progress Notes (Signed)
 Specialty Pharmacy Refill Coordination Note  Jodi Woods is a 83 y.o. female contacted today regarding refills of specialty medication(s) Dasatinib (Sprycel)   Patient requested (Patient-Rptd) Delivery   Delivery date: (Patient-Rptd) 03/10/40   Verified address: (Patient-Rptd) [address] [place] [address] [place]   Medication will be filled on 04.15.25.

## 2024-03-05 ENCOUNTER — Ambulatory Visit (HOSPITAL_BASED_OUTPATIENT_CLINIC_OR_DEPARTMENT_OTHER)

## 2024-03-05 ENCOUNTER — Encounter (HOSPITAL_BASED_OUTPATIENT_CLINIC_OR_DEPARTMENT_OTHER): Payer: Self-pay

## 2024-03-05 DIAGNOSIS — R55 Syncope and collapse: Secondary | ICD-10-CM

## 2024-03-05 LAB — ECHOCARDIOGRAM COMPLETE
AR max vel: 2.03 cm2
AV Area VTI: 1.81 cm2
AV Area mean vel: 1.82 cm2
AV Mean grad: 6 mmHg
AV Peak grad: 11.3 mmHg
AV Vena cont: 0.22 cm
Ao pk vel: 1.68 m/s
Area-P 1/2: 3.72 cm2
P 1/2 time: 618 ms
S' Lateral: 2.43 cm

## 2024-03-08 ENCOUNTER — Encounter (HOSPITAL_BASED_OUTPATIENT_CLINIC_OR_DEPARTMENT_OTHER): Payer: Self-pay

## 2024-03-08 ENCOUNTER — Telehealth: Payer: Self-pay | Admitting: Family

## 2024-03-08 NOTE — Telephone Encounter (Signed)
 Returned call to patient,     Per her ED discharge summary, there was mention of [facility]RO GY[facility] on her papers. No active referral was placed in this visit. Patient states she has never seen anyone for her incontinence problems. Advised her to follow up with PCP if still an ongoing issue.

## 2024-03-08 NOTE — Telephone Encounter (Signed)
 Pt is requesting a callback regarding her needing a referral faxed over to [facility] at [facility] at [phone]. She'd like to discuss further with nurse. Please advise

## 2024-03-13 ENCOUNTER — Other Ambulatory Visit (HOSPITAL_BASED_OUTPATIENT_CLINIC_OR_DEPARTMENT_OTHER): Payer: Self-pay

## 2024-03-17 ENCOUNTER — Other Ambulatory Visit (HOSPITAL_BASED_OUTPATIENT_CLINIC_OR_DEPARTMENT_OTHER): Payer: Self-pay

## 2024-03-26 ENCOUNTER — Other Ambulatory Visit: Payer: Self-pay

## 2024-03-29 ENCOUNTER — Other Ambulatory Visit (HOSPITAL_BASED_OUTPATIENT_CLINIC_OR_DEPARTMENT_OTHER): Payer: Self-pay

## 2024-03-29 MED ORDER — SERTRALINE HCL 50 MG PO TABS
50.0000 mg | ORAL_TABLET | Freq: Every morning | ORAL | 1 refills | Status: DC
  Filled 2024-03-29: qty 90, 90d supply, fill #0
  Filled 2024-07-02: qty 90, 90d supply, fill #1

## 2024-03-30 ENCOUNTER — Other Ambulatory Visit (HOSPITAL_COMMUNITY): Payer: Self-pay

## 2024-04-05 DIAGNOSIS — E559 Vitamin D deficiency, unspecified: Secondary | ICD-10-CM | POA: Diagnosis not present

## 2024-04-05 DIAGNOSIS — I10 Essential (primary) hypertension: Secondary | ICD-10-CM | POA: Diagnosis not present

## 2024-04-05 DIAGNOSIS — D649 Anemia, unspecified: Secondary | ICD-10-CM | POA: Diagnosis not present

## 2024-04-05 DIAGNOSIS — R5383 Other fatigue: Secondary | ICD-10-CM | POA: Diagnosis not present

## 2024-04-07 ENCOUNTER — Other Ambulatory Visit (HOSPITAL_BASED_OUTPATIENT_CLINIC_OR_DEPARTMENT_OTHER): Payer: Self-pay

## 2024-04-07 ENCOUNTER — Other Ambulatory Visit: Payer: Self-pay

## 2024-04-08 ENCOUNTER — Other Ambulatory Visit (HOSPITAL_BASED_OUTPATIENT_CLINIC_OR_DEPARTMENT_OTHER): Payer: Self-pay

## 2024-04-08 ENCOUNTER — Other Ambulatory Visit: Payer: Self-pay

## 2024-04-08 DIAGNOSIS — D509 Iron deficiency anemia, unspecified: Secondary | ICD-10-CM | POA: Diagnosis not present

## 2024-04-08 DIAGNOSIS — K5900 Constipation, unspecified: Secondary | ICD-10-CM | POA: Diagnosis not present

## 2024-04-08 DIAGNOSIS — R7989 Other specified abnormal findings of blood chemistry: Secondary | ICD-10-CM | POA: Diagnosis not present

## 2024-04-08 DIAGNOSIS — E039 Hypothyroidism, unspecified: Secondary | ICD-10-CM | POA: Diagnosis not present

## 2024-04-08 DIAGNOSIS — M8580 Other specified disorders of bone density and structure, unspecified site: Secondary | ICD-10-CM | POA: Diagnosis not present

## 2024-04-08 DIAGNOSIS — I10 Essential (primary) hypertension: Secondary | ICD-10-CM | POA: Diagnosis not present

## 2024-04-08 DIAGNOSIS — E538 Deficiency of other specified B group vitamins: Secondary | ICD-10-CM | POA: Diagnosis not present

## 2024-04-08 DIAGNOSIS — E559 Vitamin D deficiency, unspecified: Secondary | ICD-10-CM | POA: Diagnosis not present

## 2024-04-08 MED ORDER — LEVOTHYROXINE SODIUM 125 MCG PO TABS
125.0000 ug | ORAL_TABLET | Freq: Every morning | ORAL | 1 refills | Status: DC
  Filled 2024-04-08: qty 90, 90d supply, fill #0

## 2024-04-09 ENCOUNTER — Other Ambulatory Visit (HOSPITAL_BASED_OUTPATIENT_CLINIC_OR_DEPARTMENT_OTHER): Payer: Self-pay

## 2024-04-12 ENCOUNTER — Other Ambulatory Visit (HOSPITAL_COMMUNITY): Payer: Self-pay

## 2024-04-12 ENCOUNTER — Other Ambulatory Visit: Payer: Self-pay

## 2024-04-12 DIAGNOSIS — H9319 Tinnitus, unspecified ear: Secondary | ICD-10-CM | POA: Diagnosis not present

## 2024-04-12 DIAGNOSIS — I10 Essential (primary) hypertension: Secondary | ICD-10-CM | POA: Diagnosis not present

## 2024-04-12 DIAGNOSIS — H9190 Unspecified hearing loss, unspecified ear: Secondary | ICD-10-CM | POA: Diagnosis not present

## 2024-04-12 DIAGNOSIS — Z8673 Personal history of transient ischemic attack (TIA), and cerebral infarction without residual deficits: Secondary | ICD-10-CM | POA: Diagnosis not present

## 2024-04-12 NOTE — Progress Notes (Signed)
 Specialty Pharmacy Refill Coordination Note  Jodi Woods is a 83 y.o. female contacted today regarding refills of specialty medication(s) Dasatinib  (Sprycel )   Patient requested Delivery   Delivery date: 04/15/24   Verified address: [address] [place] [address] [place]    Medication will be filled on 05.21.25.

## 2024-04-13 ENCOUNTER — Other Ambulatory Visit (HOSPITAL_COMMUNITY): Payer: Self-pay

## 2024-04-14 ENCOUNTER — Telehealth: Payer: Self-pay | Admitting: Pharmacy Technician

## 2024-04-14 ENCOUNTER — Other Ambulatory Visit: Payer: Self-pay | Admitting: *Deleted

## 2024-04-14 ENCOUNTER — Other Ambulatory Visit: Payer: Self-pay

## 2024-04-14 ENCOUNTER — Other Ambulatory Visit (HOSPITAL_COMMUNITY): Payer: Self-pay

## 2024-04-14 MED ORDER — DASATINIB 50 MG PO TABS
50.0000 mg | ORAL_TABLET | ORAL | 3 refills | Status: DC
  Filled 2024-04-14: qty 15, 30d supply, fill #0
  Filled 2024-05-05: qty 15, 30d supply, fill #1
  Filled 2024-05-31 – 2024-06-02 (×2): qty 15, 30d supply, fill #2
  Filled 2024-07-02 – 2024-07-05 (×2): qty 15, 30d supply, fill #3

## 2024-04-14 NOTE — Telephone Encounter (Addendum)
 Oral Oncology Patient Advocate Encounter   Was successful in obtaining a copay card for Dasatinib .  This copay card will make the patients copay $100.  The copay card has an annual maximum benefit of $1,200   The billing information is as follows and has been shared with [facility].   RxBin: [id number] PCN: [id number] Member ID: [id number] Group ID: [id number]   Patty Benjaman Branch, CPhT Oncology Pharmacy Patient Advocate [facility] Direct Number: [phone] Fax: [phone]

## 2024-04-14 NOTE — Progress Notes (Signed)
 Sprycel  rejecting on insurance as non formulary - routed to [place] for assistance.

## 2024-04-15 ENCOUNTER — Other Ambulatory Visit: Payer: Self-pay

## 2024-04-15 ENCOUNTER — Other Ambulatory Visit (HOSPITAL_COMMUNITY): Payer: Self-pay

## 2024-04-27 ENCOUNTER — Encounter (HOSPITAL_BASED_OUTPATIENT_CLINIC_OR_DEPARTMENT_OTHER): Admitting: Cardiovascular Disease

## 2024-04-27 DIAGNOSIS — R55 Syncope and collapse: Secondary | ICD-10-CM | POA: Diagnosis not present

## 2024-04-29 ENCOUNTER — Ambulatory Visit (HOSPITAL_BASED_OUTPATIENT_CLINIC_OR_DEPARTMENT_OTHER): Payer: Self-pay | Admitting: Family

## 2024-05-05 ENCOUNTER — Other Ambulatory Visit: Payer: Self-pay

## 2024-05-05 NOTE — Progress Notes (Signed)
 Specialty Pharmacy Refill Coordination Note  Jodi Woods is a 83 y.o. female contacted today regarding refills of specialty medication(s) Dasatinib  (SPRYCEL )   Patient requested Delivery   Delivery date: 05/11/24   Verified address: [address] [place] [address] [place]    Medication will be filled on 05/10/24.

## 2024-05-05 NOTE — Progress Notes (Signed)
 Specialty Pharmacy Ongoing Clinical Assessment Note  Jodi Woods is a 83 y.o. female who is being followed by the specialty pharmacy service for RxSp Oncology   Patient's specialty medication(s) reviewed today: Dasatinib  (SPRYCEL )   Missed doses in the last 4 weeks: 0   Patient/Caregiver did not have any additional questions or concerns.   Therapeutic benefit summary: Patient is achieving benefit   Adverse events/side effects summary: No adverse events/side effects   Patient's therapy is appropriate to: Continue    Goals Addressed             This Visit's Progress    Achieve or maintain remission   On track    Patient is on track. Patient will maintain adherence. Per provider visit notes from 02/12/24 BCR/ABL shows patient remains in molecular remission.          Follow up: 6 months  [facility]

## 2024-05-10 ENCOUNTER — Other Ambulatory Visit: Payer: Self-pay

## 2024-05-18 ENCOUNTER — Other Ambulatory Visit (HOSPITAL_BASED_OUTPATIENT_CLINIC_OR_DEPARTMENT_OTHER): Payer: Self-pay

## 2024-05-31 ENCOUNTER — Other Ambulatory Visit: Payer: Self-pay

## 2024-06-01 DIAGNOSIS — H903 Sensorineural hearing loss, bilateral: Secondary | ICD-10-CM | POA: Diagnosis not present

## 2024-06-01 DIAGNOSIS — H9313 Tinnitus, bilateral: Secondary | ICD-10-CM | POA: Diagnosis not present

## 2024-06-02 ENCOUNTER — Other Ambulatory Visit (HOSPITAL_COMMUNITY): Payer: Self-pay

## 2024-06-02 ENCOUNTER — Other Ambulatory Visit: Payer: Self-pay | Admitting: Pharmacy Technician

## 2024-06-02 ENCOUNTER — Other Ambulatory Visit: Payer: Self-pay

## 2024-06-02 DIAGNOSIS — R5383 Other fatigue: Secondary | ICD-10-CM | POA: Diagnosis not present

## 2024-06-02 DIAGNOSIS — E785 Hyperlipidemia, unspecified: Secondary | ICD-10-CM | POA: Diagnosis not present

## 2024-06-02 DIAGNOSIS — E039 Hypothyroidism, unspecified: Secondary | ICD-10-CM | POA: Diagnosis not present

## 2024-06-02 NOTE — Progress Notes (Signed)
 Specialty Pharmacy Refill Coordination Note  Jodi Woods is a 83 y.o. female contacted today regarding refills of specialty medication(s) Dasatinib  (SPRYCEL )   Patient requested Delivery   Delivery date: 06/09/24   Verified address: [address] [place] [address]   Medication will be filled on 06/08/24.

## 2024-06-03 ENCOUNTER — Other Ambulatory Visit (HOSPITAL_BASED_OUTPATIENT_CLINIC_OR_DEPARTMENT_OTHER): Payer: Self-pay

## 2024-06-03 ENCOUNTER — Other Ambulatory Visit: Payer: Self-pay

## 2024-06-03 DIAGNOSIS — E039 Hypothyroidism, unspecified: Secondary | ICD-10-CM | POA: Diagnosis not present

## 2024-06-03 DIAGNOSIS — I10 Essential (primary) hypertension: Secondary | ICD-10-CM | POA: Diagnosis not present

## 2024-06-03 DIAGNOSIS — E782 Mixed hyperlipidemia: Secondary | ICD-10-CM | POA: Diagnosis not present

## 2024-06-03 DIAGNOSIS — Z79899 Other long term (current) drug therapy: Secondary | ICD-10-CM | POA: Diagnosis not present

## 2024-06-03 DIAGNOSIS — R7989 Other specified abnormal findings of blood chemistry: Secondary | ICD-10-CM | POA: Diagnosis not present

## 2024-06-03 DIAGNOSIS — C9210 Chronic myeloid leukemia, BCR/ABL-positive, not having achieved remission: Secondary | ICD-10-CM | POA: Diagnosis not present

## 2024-06-03 DIAGNOSIS — M329 Systemic lupus erythematosus, unspecified: Secondary | ICD-10-CM | POA: Diagnosis not present

## 2024-06-03 DIAGNOSIS — Z789 Other specified health status: Secondary | ICD-10-CM | POA: Diagnosis not present

## 2024-06-03 MED ORDER — TELMISARTAN 80 MG PO TABS
80.0000 mg | ORAL_TABLET | Freq: Every day | ORAL | 3 refills | Status: DC
  Filled 2024-06-03: qty 90, 90d supply, fill #0
  Filled 2024-09-28 (×2): qty 90, 90d supply, fill #1

## 2024-06-03 MED ORDER — ROSUVASTATIN CALCIUM 5 MG PO TABS
5.0000 mg | ORAL_TABLET | Freq: Every day | ORAL | 3 refills | Status: DC
  Filled 2024-06-03: qty 90, 90d supply, fill #0

## 2024-06-05 ENCOUNTER — Other Ambulatory Visit (HOSPITAL_BASED_OUTPATIENT_CLINIC_OR_DEPARTMENT_OTHER): Payer: Self-pay

## 2024-06-07 ENCOUNTER — Other Ambulatory Visit (HOSPITAL_BASED_OUTPATIENT_CLINIC_OR_DEPARTMENT_OTHER): Payer: Self-pay

## 2024-06-08 ENCOUNTER — Other Ambulatory Visit: Payer: Self-pay

## 2024-06-08 DIAGNOSIS — Z0000 Encounter for general adult medical examination without abnormal findings: Secondary | ICD-10-CM | POA: Diagnosis not present

## 2024-06-15 ENCOUNTER — Encounter (HOSPITAL_BASED_OUTPATIENT_CLINIC_OR_DEPARTMENT_OTHER): Admitting: Cardiovascular Disease

## 2024-06-15 ENCOUNTER — Encounter (HOSPITAL_BASED_OUTPATIENT_CLINIC_OR_DEPARTMENT_OTHER): Payer: Self-pay

## 2024-06-15 NOTE — Progress Notes (Deleted)
 [facility] Initial Assessment:    Date:  06/15/2024   ID:  Jodi Woods, DOB [birth year], MRN [id number]  PCP:  Waylan Almarie SAUNDERS, MD  Cardiologist:  None  Nephrologist:  Referring MD: Waylan Almarie SAUNDERS, MD   CC: Hypertension  History of Present Illness:    Jodi Woods is a 83 y.o. female with a hx of hypertension, hyperlipidemia, hypothyroidism, GERD, ANA+, systemic lupus erythematosus, chronic myelocytic leukemia, retinal aneurysm, here for follow-up.  She was first seen in the [facility] 08/2023.  She saw her PCP 05/2023 and blood pressures were in the 130s to 140s on a regimen of carvedilol  3.125 mg BID, telmisartan -HCTZ 80-25 mg daily. She requested a referral to establish care with cardiology; never had a prior stress test. She did have an echo 01/2020 showing LVEF 60-65%, normal diastolic parameters, moderately elevated PASP 51.0 mmHg, trivial aortic regurgitation but no stenosis, and no evidence of mitral regurgitation or stenosis.  At her initial visit she was feeling well.  We discussed the importance of limiting salt in her diet.  Spironolactone  was added and she was encouraged to increase her exercise.  It was later reduced due to elevated potassium.  She was seen by Reche Finder, NP 01/2019 for for ED follow-up after an episode of syncope.  There was no prodrome prior to her syncopal episode.  She noted home blood pressures were mostly in the 130s to 140s and occasionally in the 120s.  She was eating 2 meals daily and not drinking well.  In the office blood pressure was uncontrolled and not consistent consistent with orthostasis.  She was taking telmisartan , hydrochlorothiazide , and amlodipine .  Echo 02/2024 revealed LVEF 60-65% with grade 2 diastolic dysfunction.  ZIO monitor revealed rare PACs and PVCs and 10 episodes of SVT lasting up to 10 beats.   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   Arthritis    CML (chronic myelocytic leukemia) (HCC) 02/07/2020   High cholesterol    Hypertension    Hypothyroidism    Iron deficiency anemia due to chronic blood loss 02/07/2020   Pneumonia    hx of x 2   Primary hypertension 08/27/2023   Pure hypercholesterolemia 08/27/2023   Stroke (HCC)    found on MRI whle she was being tested for hearing loss- stroke in tiny blood vessel in eye per pt   Thyroid  disease     Past Surgical History:  Procedure Laterality Date   hysterectomy and bladder lift surgery      TOTAL KNEE ARTHROPLASTY Right 01/20/2023   Procedure: TOTAL KNEE ARTHROPLASTY;  Surgeon: Edna Toribio LABOR, MD;  Location: [facility];  Service: Orthopedics;  Laterality: Right;    Current Medications: No outpatient medications have been marked as taking for the 06/15/24 encounter (Appointment) with Raford Riggs, MD.     Allergies:   Versed [midazolam], Amlodipine  besylate, and Amoxicillin -pot clavulanate   Social History   Socioeconomic History   Marital status: Widowed    Spouse name: Not on file   Number of children: Not on file   Years of education: Not on file   Highest education level: Not on file  Occupational History   Not on file  Tobacco Use   Smoking status: Never   Smokeless tobacco: Never  Vaping Use   Vaping status: Never Used  Substance and Sexual Activity  Alcohol  use: Yes    Comment: rare   Drug use: Never   Sexual activity: Not Currently  Other Topics Concern   Not on file  Social History Narrative   Not on file   Social Drivers of Health   Financial Resource Strain: Not on file  Food Insecurity: No Food Insecurity (01/20/2023)   Hunger Vital Sign    Worried About Running Out of Food in the Last Year: Never true    Ran Out of Food in the Last Year: Never true  Transportation Needs: No Transportation Needs  (08/27/2023)   PRAPARE - Administrator, Civil Service (Medical): No    Lack of Transportation (Non-Medical): No  Physical Activity: Sufficiently Active (08/27/2023)   Exercise Vital Sign    Days of Exercise per Week: 5 days    Minutes of Exercise per Session: 30 min  Stress: Not on file  Social Connections: Not on file     Family History: The patient's family history includes Heart attack in her maternal grandmother; Lung cancer in her father.  ROS:   Please see the history of present illness.    (+) Arthralgias (+) Lower back pain (+) Snoring (+) Night sweats All other systems reviewed and are negative.  EKGs/Labs/Other Studies Reviewed:    Echo 02/2024: 1. Left ventricular ejection fraction, by estimation, is 60 to 65%. Left  ventricular ejection fraction by 3D volume is 70 %. The left ventricle has  normal function. The left ventricle has no regional wall motion  abnormalities. Left ventricular diastolic   parameters are consistent with Grade II diastolic dysfunction  (pseudonormalization). Elevated left atrial pressure. The average left  ventricular global longitudinal strain is -20.5 %. The global longitudinal  strain is normal.   2. Right ventricular systolic function is normal. The right ventricular  size is normal. There is mildly elevated pulmonary artery systolic  pressure. The estimated right ventricular systolic pressure is 42.9 mmHg.   3. Left atrial size was mildly dilated.   4. Right atrial size was mildly dilated.   5. The mitral valve is normal in structure. Trivial mitral valve  regurgitation.   6. The aortic valve is tricuspid. Aortic valve regurgitation is trivial.  Aortic valve sclerosis is present, with no evidence of aortic valve  stenosis.   7. The inferior vena cava is normal in size with greater than 50%  respiratory variability, suggesting right atrial pressure of 3 mmHg.   14 Day Zio Monitor 02/2024:   Quality: Fair.  Baseline  artiact. Predominant rhythm: Sinus rhythm Average heart rate: 62 bpm Max heart rate: 109 bpm sinus rhythm, 169 bpm SVT Min heart rate: 46 bpm Pauses >2.5 seconds: None   Rare (<1%) PACs and PVCs 10 episodes of SVT lasting up to 10 beats  EKG:  EKG is personally reviewed. 08/27/2023: Not ordered.  Recent Labs: 02/10/2024: ALT 16; BUN 30; Creatinine, Ser 1.15; Hemoglobin 11.0; Platelets 160; Potassium 4.1; Sodium 137   Recent Lipid Panel No results found for: CHOL, TRIG, HDL, CHOLHDL, VLDL, LDLCALC, LDLDIRECT  Physical Exam:    VS:  There were no vitals taken for this visit. , BMI There is no height or weight on file to calculate BMI. GENERAL:  Well appearing HEENT: Pupils equal round and reactive, fundi not visualized, oral mucosa unremarkable NECK:  No jugular venous distention, waveform within normal limits, carotid upstroke brisk and symmetric, no bruits, no thyromegaly LYMPHATICS:  No cervical adenopathy LUNGS:  Clear to auscultation bilaterally HEART:  RRR.  PMI not displaced or sustained, S1 and S2 within normal limits, no S3, no S4, no clicks, no rubs, no murmurs ABD:  Flat, positive bowel sounds normal in frequency in pitch, no bruits, no rebound, no guarding, no midline pulsatile mass, no hepatomegaly, no splenomegaly EXT:  2 plus pulses throughout, no edema, no cyanosis, no clubbing SKIN:  No rashes, no nodules NEURO:  Cranial nerves II through XII grossly intact, motor grossly intact throughout PSYCH:  Cognitively intact, oriented to person place and time   ASSESSMENT/PLAN:    Assessment & Plan     Screening for Secondary Hypertension:     08/27/2023    9:32 AM  Causes  Drugs/Herbals Screened     - Comments high sodium diet.  1 coffee daily.  Rare EtOH.  Renovascular HTN N/A  Sleep Apnea Screened     - Comments snores.  no other symptoms.  no overnight desaturation  Thyroid  Disease Screened  Hyperaldosteronism N/A  Pheochromocytoma N/A   Cushing's Syndrome N/A  Hyperparathyroidism Screened  Coarctation of the Aorta Screened     - Comments BP symmetric  Compliance Screened    Relevant Labs/Studies:    Latest Ref Rng & Units 02/10/2024    9:26 PM 02/06/2024    8:39 AM 09/16/2023    2:20 PM  Basic Labs  Sodium 135 - 145 mmol/L 137  138  140   Potassium 3.5 - 5.1 mmol/L 4.1  4.6  5.5   Creatinine 0.44 - 1.00 mg/dL 8.84  8.79  8.71       Disposition:    FU with APP/PharmD in 1 month for the next 3 months.   FU with Joelys Staubs C. Raford, MD, [facility] in 4 months.  Medication Adjustments/Labs and Tests Ordered: Current medicines are reviewed at length with the patient today.  Concerns regarding medicines are outlined above.   No orders of the defined types were placed in this encounter.  No orders of the defined types were placed in this encounter.    Signed, Annabella Raford, MD  06/15/2024 7:49 AM    [facility]

## 2024-07-02 ENCOUNTER — Other Ambulatory Visit: Payer: Self-pay

## 2024-07-02 ENCOUNTER — Other Ambulatory Visit (HOSPITAL_BASED_OUTPATIENT_CLINIC_OR_DEPARTMENT_OTHER): Payer: Self-pay

## 2024-07-03 ENCOUNTER — Other Ambulatory Visit (HOSPITAL_COMMUNITY): Payer: Self-pay

## 2024-07-05 ENCOUNTER — Other Ambulatory Visit: Payer: Self-pay

## 2024-07-05 ENCOUNTER — Other Ambulatory Visit (HOSPITAL_COMMUNITY): Payer: Self-pay

## 2024-07-05 ENCOUNTER — Other Ambulatory Visit (HOSPITAL_BASED_OUTPATIENT_CLINIC_OR_DEPARTMENT_OTHER): Payer: Self-pay

## 2024-07-05 MED ORDER — FAMOTIDINE 20 MG PO TABS
20.0000 mg | ORAL_TABLET | Freq: Two times a day (BID) | ORAL | 1 refills | Status: AC
  Filled 2024-07-05: qty 180, 90d supply, fill #0
  Filled 2024-11-14: qty 180, 90d supply, fill #1

## 2024-07-05 NOTE — Progress Notes (Signed)
 Clinical Intervention Note  Clinical Intervention Notes: Patient inquired about potential drug interactions between Famotidine  and Sprycel . I explained that there are interactions between the two, including an increased risk of QT interval prolongation when used together. I also informed the patient that Famotidine  may reduce the absorption of Sprycel . When asked about her reflux symptoms, she reported none currently. She mentioned taking Sprycel  every other day and plans to withhold Famotidine  on treatment days to monitor for recurrence of reflux. She intends to discuss this further with her provider at her next appointment. All questions were addressed.   Clinical Intervention Outcomes: Prevention of an adverse drug event; Improved therapy effectiveness   Silvano LOISE Dolly Specialty Pharmacist

## 2024-07-05 NOTE — Progress Notes (Signed)
 Specialty Pharmacy Refill Coordination Note  Jodi Woods is a 83 y.o. female contacted today regarding refills of specialty medication(s) Dasatinib  (SPRYCEL )   Patient requested Delivery   Delivery date: 07/07/24   Verified address: [address] [place] [address]   Medication will be filled on 07/06/24.

## 2024-07-06 ENCOUNTER — Other Ambulatory Visit (HOSPITAL_BASED_OUTPATIENT_CLINIC_OR_DEPARTMENT_OTHER): Payer: Self-pay

## 2024-07-08 ENCOUNTER — Other Ambulatory Visit (HOSPITAL_BASED_OUTPATIENT_CLINIC_OR_DEPARTMENT_OTHER): Payer: Self-pay

## 2024-07-08 MED ORDER — SERTRALINE HCL 25 MG PO TABS
25.0000 mg | ORAL_TABLET | Freq: Every morning | ORAL | 0 refills | Status: DC
  Filled 2024-07-08: qty 90, 90d supply, fill #0

## 2024-07-19 ENCOUNTER — Other Ambulatory Visit (HOSPITAL_BASED_OUTPATIENT_CLINIC_OR_DEPARTMENT_OTHER): Payer: Self-pay

## 2024-07-19 DIAGNOSIS — F331 Major depressive disorder, recurrent, moderate: Secondary | ICD-10-CM | POA: Diagnosis not present

## 2024-07-19 DIAGNOSIS — Z0000 Encounter for general adult medical examination without abnormal findings: Secondary | ICD-10-CM | POA: Diagnosis not present

## 2024-07-19 DIAGNOSIS — I10 Essential (primary) hypertension: Secondary | ICD-10-CM | POA: Diagnosis not present

## 2024-07-19 DIAGNOSIS — G4700 Insomnia, unspecified: Secondary | ICD-10-CM | POA: Diagnosis not present

## 2024-07-19 DIAGNOSIS — Z23 Encounter for immunization: Secondary | ICD-10-CM | POA: Diagnosis not present

## 2024-07-19 DIAGNOSIS — F411 Generalized anxiety disorder: Secondary | ICD-10-CM | POA: Diagnosis not present

## 2024-07-19 DIAGNOSIS — Z1331 Encounter for screening for depression: Secondary | ICD-10-CM | POA: Diagnosis not present

## 2024-07-19 DIAGNOSIS — Z1339 Encounter for screening examination for other mental health and behavioral disorders: Secondary | ICD-10-CM | POA: Diagnosis not present

## 2024-07-19 MED ORDER — SERTRALINE HCL 25 MG PO TABS
25.0000 mg | ORAL_TABLET | Freq: Every morning | ORAL | 3 refills | Status: AC
  Filled 2024-10-05: qty 90, 90d supply, fill #0

## 2024-07-19 MED ORDER — SERTRALINE HCL 50 MG PO TABS
50.0000 mg | ORAL_TABLET | Freq: Every morning | ORAL | 3 refills | Status: DC
  Filled 2024-10-05: qty 90, 90d supply, fill #0

## 2024-07-29 ENCOUNTER — Other Ambulatory Visit (HOSPITAL_COMMUNITY): Payer: Self-pay

## 2024-08-03 ENCOUNTER — Other Ambulatory Visit: Payer: Self-pay | Admitting: Hematology & Oncology

## 2024-08-03 ENCOUNTER — Other Ambulatory Visit: Payer: Self-pay

## 2024-08-03 MED ORDER — DASATINIB 50 MG PO TABS
50.0000 mg | ORAL_TABLET | ORAL | 3 refills | Status: DC
  Filled 2024-08-03 – 2024-08-06 (×2): qty 15, 30d supply, fill #0
  Filled 2024-09-13: qty 15, 30d supply, fill #1
  Filled 2024-10-07: qty 15, 30d supply, fill #2
  Filled 2024-11-10: qty 15, 30d supply, fill #3

## 2024-08-04 DIAGNOSIS — H3500 Unspecified background retinopathy: Secondary | ICD-10-CM | POA: Diagnosis not present

## 2024-08-04 DIAGNOSIS — H52203 Unspecified astigmatism, bilateral: Secondary | ICD-10-CM | POA: Diagnosis not present

## 2024-08-04 DIAGNOSIS — H04123 Dry eye syndrome of bilateral lacrimal glands: Secondary | ICD-10-CM | POA: Diagnosis not present

## 2024-08-04 DIAGNOSIS — Z961 Presence of intraocular lens: Secondary | ICD-10-CM | POA: Diagnosis not present

## 2024-08-06 ENCOUNTER — Other Ambulatory Visit: Payer: Self-pay

## 2024-08-09 ENCOUNTER — Encounter: Payer: Self-pay | Admitting: Hematology & Oncology

## 2024-08-09 ENCOUNTER — Other Ambulatory Visit: Payer: Self-pay

## 2024-08-09 ENCOUNTER — Inpatient Hospital Stay: Attending: Hematology & Oncology

## 2024-08-09 ENCOUNTER — Inpatient Hospital Stay (HOSPITAL_BASED_OUTPATIENT_CLINIC_OR_DEPARTMENT_OTHER): Admitting: Hematology & Oncology

## 2024-08-09 VITALS — BP 138/35 | HR 60 | Temp 98.6°F | Resp 16 | Ht 61.0 in | Wt 149.0 lb

## 2024-08-09 DIAGNOSIS — Z7982 Long term (current) use of aspirin: Secondary | ICD-10-CM | POA: Insufficient documentation

## 2024-08-09 DIAGNOSIS — C9211 Chronic myeloid leukemia, BCR/ABL-positive, in remission: Secondary | ICD-10-CM | POA: Diagnosis not present

## 2024-08-09 DIAGNOSIS — D500 Iron deficiency anemia secondary to blood loss (chronic): Secondary | ICD-10-CM | POA: Diagnosis not present

## 2024-08-09 DIAGNOSIS — C9210 Chronic myeloid leukemia, BCR/ABL-positive, not having achieved remission: Secondary | ICD-10-CM

## 2024-08-09 DIAGNOSIS — Z79899 Other long term (current) drug therapy: Secondary | ICD-10-CM | POA: Diagnosis not present

## 2024-08-09 LAB — CMP (CANCER CENTER ONLY)
ALT: 15 U/L (ref 0–44)
AST: 28 U/L (ref 15–41)
Albumin: 4.4 g/dL (ref 3.5–5.0)
Alkaline Phosphatase: 64 U/L (ref 38–126)
Anion gap: 9 (ref 5–15)
BUN: 28 mg/dL — ABNORMAL HIGH (ref 8–23)
CO2: 26 mmol/L (ref 22–32)
Calcium: 9.4 mg/dL (ref 8.9–10.3)
Chloride: 101 mmol/L (ref 98–111)
Creatinine: 1.19 mg/dL — ABNORMAL HIGH (ref 0.44–1.00)
GFR, Estimated: 45 mL/min — ABNORMAL LOW (ref >60)
Glucose, Bld: 98 mg/dL (ref 70–99)
Potassium: 5.1 mmol/L (ref 3.5–5.1)
Sodium: 135 mmol/L (ref 135–145)
Total Bilirubin: 0.5 mg/dL (ref 0.0–1.2)
Total Protein: 6.6 g/dL (ref 6.5–8.1)

## 2024-08-09 LAB — CBC WITH DIFFERENTIAL (CANCER CENTER ONLY)
Abs Immature Granulocytes: 0.02 K/uL (ref 0.00–0.07)
Basophils Absolute: 0 K/uL (ref 0.0–0.1)
Basophils Relative: 0 %
Eosinophils Absolute: 0.1 K/uL (ref 0.0–0.5)
Eosinophils Relative: 2 %
HCT: 33.7 % — ABNORMAL LOW (ref 36.0–46.0)
Hemoglobin: 11.3 g/dL — ABNORMAL LOW (ref 12.0–15.0)
Immature Granulocytes: 0 %
Lymphocytes Relative: 27 %
Lymphs Abs: 1.4 K/uL (ref 0.7–4.0)
MCH: 31.7 pg (ref 26.0–34.0)
MCHC: 33.5 g/dL (ref 30.0–36.0)
MCV: 94.7 fL (ref 80.0–100.0)
Monocytes Absolute: 0.4 K/uL (ref 0.1–1.0)
Monocytes Relative: 8 %
Neutro Abs: 3.3 K/uL (ref 1.7–7.7)
Neutrophils Relative %: 63 %
Platelet Count: 164 K/uL (ref 150–400)
RBC: 3.56 MIL/uL — ABNORMAL LOW (ref 3.87–5.11)
RDW: 13.1 % (ref 11.5–15.5)
WBC Count: 5.3 K/uL (ref 4.0–10.5)
nRBC: 0 % (ref 0.0–0.2)

## 2024-08-09 LAB — LACTATE DEHYDROGENASE: LDH: 186 U/L (ref 98–192)

## 2024-08-09 NOTE — Progress Notes (Signed)
 Hematology and Oncology Follow Up Visit  Orianna Biskup [id number] [birth year] 83 y.o. 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 Fortson is here today for follow-up.  She is doing pretty well.  We last saw her I think back in March.  At that time, she was in a major molecular remission.  She is doing well on the Sprycel .  Unfortunately, she has spent 100 bucks for this since it is generic.  There has been no issues with nausea or vomiting.  She has had no diarrhea.  She has had a little bit of constipation.  She is still at home.  She is thinking about going to [employer].  She has had no fever.  She has had no cough.  She has had no headache.  Think back in March, she may have had an episode of syncope because of dehydration.  Overall, I would say that her performance status is ECOG 1.    Medications:  Allergies as of 08/09/2024       Reactions   Versed [midazolam] Other (See Comments)   PT does not want any Versed due to memory loss potential    Amlodipine  Besylate Swelling   Swelling in legs Other Reaction(s): edema/ vision changes   Amoxicillin -pot Clavulanate Diarrhea   Other Reaction(s): GI distress   Tetanus-diphtheria Toxoids Td    Other Reaction(s): feels ill        Medication List        Accurate as of August 09, 2024 12:31 PM. If you have any questions, ask your nurse or doctor.          STOP taking these medications    atorvastatin  20 MG tablet Commonly known as: LIPITOR Stopped by: Peggy Loge R Christeena Krogh   multivitamin with minerals Tabs tablet Stopped by: Jabier Deese R Danial Sisley       TAKE these medications    amLODipine  5 MG tablet Commonly known as: NORVASC  Take 1 tablet (5 mg total) by mouth daily for hypertenstion   amoxicillin  500 MG capsule Commonly known as: AMOXIL  Take 4 capsules (2,000 mg total) by mouth 1 hour  prior to dental appointment.   aspirin EC 81 MG tablet Take 81 mg by mouth daily.   clindamycin  300 MG capsule Commonly known as: CLEOCIN  Take 2 capsules (600 mg total) by mouth as a single dose 30 minutes prior to dental procedure as directed.   dasatinib  50 MG tablet Commonly known as: SPRYCEL  Take 1 tablet (50 mg total) by mouth every other day. What changed: Another medication with the same name was removed. Continue taking this medication, and follow the directions you see here. Changed by: Maude JONELLE Crease   famotidine  20 MG tablet Commonly known as: PEPCID  Take 1 tablet (20 mg total) by mouth 2 (two) times daily.   hydrochlorothiazide  25 MG tablet Commonly known as: HYDRODIURIL  Take 1 tablet (25 mg total) by mouth daily. What changed: how much to take   levothyroxine  125 MCG tablet Commonly known as: SYNTHROID  Take 1 tablet (125 mcg total) by mouth on an empty stomach every Tuesday, Thursday, Saturday, and Sunday every morning. What changed: Another medication with the same name was removed. Continue taking this medication, and follow the directions you see here. Changed by: Maude JONELLE Crease   rosuvastatin  5 MG tablet Commonly known as:  CRESTOR  Take 1 tablet (5 mg total) by mouth at bedtime.   sertraline  25 MG tablet Commonly known as: ZOLOFT  Take 1 tablet (25 mg total) by mouth in the morning with 50mg  tablet. What changed: Another medication with the same name was removed. Continue taking this medication, and follow the directions you see here. Changed by: Maude JONELLE Crease   sertraline  50 MG tablet Commonly known as: ZOLOFT  Take 1 tablet (50 mg total) by mouth in the morning with 25mg  tablet. What changed: Another medication with the same name was removed. Continue taking this medication, and follow the directions you see here. Changed by: Maude JONELLE Crease   SUCRALFATE PO Take by mouth.   telmisartan  80 MG tablet Commonly known as: MICARDIS  Take 1 tablet (80 mg  total) by mouth daily. What changed: Another medication with the same name was removed. Continue taking this medication, and follow the directions you see here. Changed by: Maude JONELLE Crease   Tylenol  8 Hour Arthritis Pain 650 MG CR tablet Generic drug: acetaminophen  Take 650 mg by mouth every 8 (eight) hours as needed.   Vitamin B12 1000 MCG Tbcr Take 1,000 mcg by mouth 3 (three) times a week.   Vitamin D 125 MCG (5000 UT) Caps Take 5,000 Units by mouth 3 (three) times a week.        Allergies:  Allergies  Allergen Reactions   Versed [Midazolam] Other (See Comments)    PT does not want any Versed due to memory loss potential    Amlodipine  Besylate Swelling    Swelling in legs  Other Reaction(s): edema/ vision changes   Amoxicillin -Pot Clavulanate Diarrhea    Other Reaction(s): GI distress   Tetanus-Diphtheria Toxoids Td     Other Reaction(s): feels ill    Past Medical History, Surgical history, Social history, and Family History were reviewed and updated.  Review of Systems: Review of Systems  Constitutional: Negative.   HENT: Negative.    Eyes: Negative.   Respiratory: Negative.    Cardiovascular: Negative.   Gastrointestinal: Negative.   Genitourinary: Negative.   Musculoskeletal: Negative.   Skin: Negative.   Neurological: Negative.   Endo/Heme/Allergies: Negative.   Psychiatric/Behavioral: Negative.       Physical Exam:  height is 5' 1 (1.549 m) and weight is 149 lb (67.6 kg). Her oral temperature is 98.6 F (37 C). Her blood pressure is 138/35 (abnormal) and her pulse is 60. Her respiration is 16 and oxygen saturation is 98%.   Wt Readings from Last 3 Encounters:  08/09/24 149 lb (67.6 kg)  02/16/24 148 lb (67.1 kg)  02/06/24 150 lb (68 kg)    Physical Exam Vitals reviewed.  HENT:     Head: Normocephalic and atraumatic.  Eyes:     Pupils: Pupils are equal, round, and reactive to light.  Cardiovascular:     Rate and Rhythm: Normal rate and  regular rhythm.     Heart sounds: Normal heart sounds.  Pulmonary:     Effort: Pulmonary effort is normal.     Breath sounds: Normal breath sounds.  Abdominal:     General: Bowel sounds are normal.     Palpations: Abdomen is soft.  Musculoskeletal:        General: No tenderness or deformity. Normal range of motion.     Cervical back: Normal range of motion.  Lymphadenopathy:     Cervical: No cervical adenopathy.  Skin:    General: Skin is warm and dry.     Findings:  No erythema or rash.  Neurological:     Mental Status: She is alert and oriented to person, place, and time.  Psychiatric:        Behavior: Behavior normal.        Thought Content: Thought content normal.        Judgment: Judgment normal.     Lab Results  Component Value Date   WBC 5.3 08/09/2024   HGB 11.3 (L) 08/09/2024   HCT 33.7 (L) 08/09/2024   MCV 94.7 08/09/2024   PLT 164 08/09/2024   Lab Results  Component Value Date   FERRITIN 50 02/06/2024   IRON 102 02/06/2024   TIBC 358 02/06/2024   UIBC 256 02/06/2024   IRONPCTSAT 29 02/06/2024   Lab Results  Component Value Date   RETICCTPCT 2.5 02/06/2024   RBC 3.56 (L) 08/09/2024   No results found for: KPAFRELGTCHN, LAMBDASER, KAPLAMBRATIO No results found for: IGGSERUM, IGA, IGMSERUM No results found for: STEPHANY CARLOTA BENSON MARKEL EARLA JOANNIE DOC VICK, SPEI   Chemistry      Component Value Date/Time   NA 135 08/09/2024 1140   NA 140 09/16/2023 1420   K 5.1 08/09/2024 1140   CL 101 08/09/2024 1140   CO2 26 08/09/2024 1140   BUN 28 (H) 08/09/2024 1140   BUN 34 (H) 09/16/2023 1420   CREATININE 1.19 (H) 08/09/2024 1140      Component Value Date/Time   CALCIUM  9.4 08/09/2024 1140   ALKPHOS 64 08/09/2024 1140   AST 28 08/09/2024 1140   ALT 15 08/09/2024 1140   BILITOT 0.5 08/09/2024 1140       Impression and Plan: Mrs. Reagor is a very pleasant 83 yo caucasian female with CML.  So happy  that she is doing so well.  She is really on low-dose Sprycel .  We will continue to follow her along.  I do not see that we have to make any changes with her Sprycel .  We will still plan to get her back in 6 months.  This way, we will get her back after the Holiday season and try to get her through Winter.    Maude JONELLE Crease, MD 9/15/202512:31 PM

## 2024-08-10 ENCOUNTER — Other Ambulatory Visit: Payer: Self-pay

## 2024-08-10 ENCOUNTER — Other Ambulatory Visit: Payer: Self-pay | Admitting: Pharmacy Technician

## 2024-08-10 NOTE — Progress Notes (Signed)
 Specialty Pharmacy Refill Coordination Note  Jodi Woods is a 83 y.o. female contacted today regarding refills of specialty medication(s) Dasatinib  (SPRYCEL )   Patient requested Delivery   Delivery date: 08/18/24   Verified address: [address]  [place]   Medication will be filled on 08/17/24.

## 2024-08-12 DIAGNOSIS — E039 Hypothyroidism, unspecified: Secondary | ICD-10-CM | POA: Diagnosis not present

## 2024-08-12 DIAGNOSIS — E785 Hyperlipidemia, unspecified: Secondary | ICD-10-CM | POA: Diagnosis not present

## 2024-08-12 DIAGNOSIS — I10 Essential (primary) hypertension: Secondary | ICD-10-CM | POA: Diagnosis not present

## 2024-08-12 DIAGNOSIS — R5383 Other fatigue: Secondary | ICD-10-CM | POA: Diagnosis not present

## 2024-08-17 ENCOUNTER — Other Ambulatory Visit: Payer: Self-pay

## 2024-08-18 ENCOUNTER — Other Ambulatory Visit (HOSPITAL_BASED_OUTPATIENT_CLINIC_OR_DEPARTMENT_OTHER): Payer: Self-pay

## 2024-08-18 DIAGNOSIS — I10 Essential (primary) hypertension: Secondary | ICD-10-CM | POA: Diagnosis not present

## 2024-08-18 DIAGNOSIS — H9190 Unspecified hearing loss, unspecified ear: Secondary | ICD-10-CM | POA: Diagnosis not present

## 2024-08-18 DIAGNOSIS — H9319 Tinnitus, unspecified ear: Secondary | ICD-10-CM | POA: Diagnosis not present

## 2024-08-18 DIAGNOSIS — H35042 Retinal micro-aneurysms, unspecified, left eye: Secondary | ICD-10-CM | POA: Diagnosis not present

## 2024-08-18 DIAGNOSIS — H348312 Tributary (branch) retinal vein occlusion, right eye, stable: Secondary | ICD-10-CM | POA: Diagnosis not present

## 2024-08-18 DIAGNOSIS — R7989 Other specified abnormal findings of blood chemistry: Secondary | ICD-10-CM | POA: Diagnosis not present

## 2024-08-18 DIAGNOSIS — E039 Hypothyroidism, unspecified: Secondary | ICD-10-CM | POA: Diagnosis not present

## 2024-08-18 DIAGNOSIS — M329 Systemic lupus erythematosus, unspecified: Secondary | ICD-10-CM | POA: Diagnosis not present

## 2024-08-18 DIAGNOSIS — M8580 Other specified disorders of bone density and structure, unspecified site: Secondary | ICD-10-CM | POA: Diagnosis not present

## 2024-08-18 DIAGNOSIS — C9210 Chronic myeloid leukemia, BCR/ABL-positive, not having achieved remission: Secondary | ICD-10-CM | POA: Diagnosis not present

## 2024-08-18 DIAGNOSIS — Z79899 Other long term (current) drug therapy: Secondary | ICD-10-CM | POA: Diagnosis not present

## 2024-08-18 DIAGNOSIS — E782 Mixed hyperlipidemia: Secondary | ICD-10-CM | POA: Diagnosis not present

## 2024-08-18 MED ORDER — LEVOTHYROXINE SODIUM 150 MCG PO TABS
150.0000 ug | ORAL_TABLET | Freq: Every morning | ORAL | 1 refills | Status: DC
  Filled 2024-08-18: qty 90, 90d supply, fill #0
  Filled 2024-11-14: qty 90, 90d supply, fill #1

## 2024-08-18 MED ORDER — ROSUVASTATIN CALCIUM 5 MG PO TABS
5.0000 mg | ORAL_TABLET | Freq: Every day | ORAL | 3 refills | Status: AC
  Filled 2024-08-18: qty 90, 90d supply, fill #0
  Filled 2024-11-23: qty 90, 90d supply, fill #1

## 2024-08-18 MED ORDER — AMLODIPINE BESYLATE 10 MG PO TABS
10.0000 mg | ORAL_TABLET | Freq: Every day | ORAL | 3 refills | Status: DC
  Filled 2024-08-18: qty 90, 90d supply, fill #0

## 2024-08-26 DIAGNOSIS — Z23 Encounter for immunization: Secondary | ICD-10-CM | POA: Diagnosis not present

## 2024-08-26 DIAGNOSIS — Z7185 Encounter for immunization safety counseling: Secondary | ICD-10-CM | POA: Diagnosis not present

## 2024-08-26 DIAGNOSIS — I10 Essential (primary) hypertension: Secondary | ICD-10-CM | POA: Diagnosis not present

## 2024-09-09 ENCOUNTER — Ambulatory Visit (INDEPENDENT_AMBULATORY_CARE_PROVIDER_SITE_OTHER): Admitting: Cardiovascular Disease

## 2024-09-09 ENCOUNTER — Encounter (HOSPITAL_BASED_OUTPATIENT_CLINIC_OR_DEPARTMENT_OTHER): Payer: Self-pay | Admitting: Cardiovascular Disease

## 2024-09-09 VITALS — BP 160/52 | HR 62 | Ht 61.0 in | Wt 151.4 lb

## 2024-09-09 DIAGNOSIS — E7800 Pure hypercholesterolemia, unspecified: Secondary | ICD-10-CM | POA: Diagnosis not present

## 2024-09-09 DIAGNOSIS — C9210 Chronic myeloid leukemia, BCR/ABL-positive, not having achieved remission: Secondary | ICD-10-CM

## 2024-09-09 DIAGNOSIS — I10 Essential (primary) hypertension: Secondary | ICD-10-CM

## 2024-09-09 DIAGNOSIS — R55 Syncope and collapse: Secondary | ICD-10-CM

## 2024-09-09 NOTE — Patient Instructions (Signed)
 Medication Instructions:  Your physician recommends that you continue on your current medications as directed. Please refer to the Current Medication list given to you today.   Labwork: none  Testing/Procedures: none  Follow-Up:  12/14/2024 4:00 pm with Dr Raford

## 2024-09-09 NOTE — Progress Notes (Signed)
 [facility] Initial Assessment:    Date:  09/09/2024   ID:  Jodi Woods, DOB 03-11-1941, MRN [id number]  PCP:  Waylan Almarie SAUNDERS, MD  Cardiologist:  None  Nephrologist:  Referring MD: Waylan Almarie SAUNDERS, MD   CC: Hypertension  History of Present Illness:    Jodi Woods is a 83 y.o. female with a hx of hypertension, hyperlipidemia, hypothyroidism, GERD, ANA+, systemic lupus erythematosus, chronic myelocytic leukemia, retinal aneurysm, here for follow up.  She was first seen in the [facility] 08/2023.  She was seen 06/12/2023 by telehealth visit. She was monitoring home blood pressures and reported readings including 144/67, 138/66 on a regimen of carvedilol  3.125 mg BID, telmisartan -HCTZ 80-25 mg daily. She requested a referral to establish care with cardiology; never had a prior stress test. She did have an echo 01/2020 showing LVEF 60-65%, normal diastolic parameters, moderately elevated PASP 51.0 mmHg, trivial aortic regurgitation but no stenosis, and no evidence of mitral regurgitation or stenosis.  Today, she states she is feeling great aside from her uncontrolled blood pressure, which has been difficult to control since 2015. She confirms that within the past week, her PCP increased her carvedilol  to 6.25 mg BID, and her HCTZ was discontinued. She expresses concerns about being on carvedilol  as in the past this caused side effects of bradycardia in the 40's. In the office her blood pressure is 173/62 initially, and 170/58 on manual recheck. She denies any associated lightheadedness or dizziness. Recently she checked at home and her blood pressure was 160/67, but improved to 121/62 about ten minutes later. She does well with remembering to take her medications. She does not participate in much formal exercise. Limited by joint pain and lower back pain. In 12/2022 she underwent right total knee arthroplasty. Since then the ROM of her right knee has never  really returned to her baseline. Around that time she also struggled with persistent nausea and loss of appetite, and lost about 18 lbs. At one point she believed she had developed gastroparesis. Typically she cooks her meals at home, and will add salt to her meals. Generally tries to follow a good diet, although some days are worse than others. Normally she drinks 1 cup of coffee a day, only rare soft drinks. Alcohol  consumption is limited to rare social occasions. At night, she believes she does snore and complains of frequent night sweats. She tends to sleep well until 2-3 AM, at which point she tosses and turns more often. No apneic episodes per her husband. She denies any palpitations, chest pain, shortness of breath, peripheral edema, headaches, syncope, orthopnea, or PND.  At her initial visit spironolactone  was added.  She followed up with Caitlin walker 01/2024 after an episode of syncope.  She fell asleep and got up at 7 am when she had a syncopal episode withotu prodrome.  BP was ranging 130-150s.  She was not orthostatic in the office. Echo revealed LVEF 60-65% with grade 2 diastolic dysfunction.  Monitor showed short episodes of SVT.  Carotid Dopplers were negative.    Discussed the use of AI scribe software for clinical note transcription with the patient, who gave verbal consent to proceed.  History of Present Illness Jodi Woods experiences significant fatigue, making it challenging to complete daily tasks. Her sleep pattern has changed, waking up as late as 10 AM despite going to bed early. She attributes this to a lack of commitments since retirement and has not been going to the gym, considering  canceling her membership. Attempts to walk on the treadmill are hindered by concerns about it breaking. She feels that retirement has led to a lack of incentive to complete tasks.  She feels very tired after activities such as attending a class and shopping, which leaves her completely worn out.  She experiences back pain, especially after sitting for extended periods, which sometimes leads her to lie down and fall asleep. There is a noticeable decrease in energy, and her phone indicates a low walking steadiness score. She occasionally feels unsteady while walking.  She recalls an episode of syncope, which she attributes to dehydration, and finds it difficult to remember to drink water . She is currently taking 10 mg of amlodipine  and telmisartan  for her hypertension. Hydrochlorothiazide  was previously stopped due to dehydration concerns. She was also prescribed sertraline  for anxiety, which was thought to be contributing to her elevated blood pressure.  She discusses her diet, noting that she does not consume many vegetables but tries to eat a lot of protein, including a new favorite of cottage cheese in scrambled eggs. She is trying to limit carbohydrates to help manage her weight and blood pressure.   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   Arthritis    CML (chronic myelocytic leukemia) (HCC) 02/07/2020   High cholesterol    Hypertension    Hypothyroidism    Iron deficiency anemia due to chronic blood loss 02/07/2020   Pneumonia    hx of x 2   Primary hypertension 08/27/2023   Pure hypercholesterolemia 08/27/2023   Stroke (HCC)    found on MRI whle she was being tested for hearing loss- stroke in tiny blood vessel in eye per pt   Thyroid  disease     Past Surgical History:  Procedure Laterality Date   hysterectomy and bladder lift surgery      TOTAL KNEE ARTHROPLASTY Right 01/20/2023   Procedure: TOTAL KNEE ARTHROPLASTY;  Surgeon: Edna Toribio LABOR, MD;  Location: [facility];  Service: Orthopedics;  Laterality: Right;    Current Medications: Current Meds  Medication Sig   acetaminophen  (TYLENOL  8 HOUR ARTHRITIS PAIN) 650 MG CR  tablet Take 650 mg by mouth every 8 (eight) hours as needed.   amLODipine  (NORVASC ) 10 MG tablet Take 1 tablet (10 mg total) by mouth daily.   Cholecalciferol  (VITAMIN D) 125 MCG (5000 UT) CAPS Take 5,000 Units by mouth 3 (three) times a week.   Cyanocobalamin  (VITAMIN B12) 1000 MCG TBCR Take 1,000 mcg by mouth 3 (three) times a week.   dasatinib  (SPRYCEL ) 50 MG tablet Take 1 tablet (50 mg total) by mouth every other day.   famotidine  (PEPCID ) 20 MG tablet Take 1 tablet (20 mg total) by mouth 2 (two) times daily.   levothyroxine  (SYNTHROID ) 150 MCG tablet Take 1 tablet (150 mcg total) by mouth every morning on an empty stomach.   rosuvastatin  (CRESTOR ) 5 MG tablet Take 1 tablet (5 mg total) by mouth at bedtime.   sertraline  (ZOLOFT ) 25 MG tablet Take 1 tablet (25 mg total) by mouth in the morning with 50mg  tablet.   sertraline  (ZOLOFT ) 50 MG tablet Take 1 tablet (50 mg total) by mouth in the morning with 25mg  tablet.   telmisartan  (MICARDIS ) 80 MG tablet Take 1 tablet (80 mg total) by mouth daily.     Allergies:   Versed [midazolam], Amlodipine  besylate, Amoxicillin -pot clavulanate,  and Tetanus-diphtheria toxoids td   Social History   Socioeconomic History   Marital status: Widowed    Spouse name: Not on file   Number of children: Not on file   Years of education: Not on file   Highest education level: Not on file  Occupational History   Not on file  Tobacco Use   Smoking status: Never   Smokeless tobacco: Never  Vaping Use   Vaping status: Never Used  Substance and Sexual Activity   Alcohol  use: Yes    Comment: rare   Drug use: Never   Sexual activity: Not Currently  Other Topics Concern   Not on file  Social History Narrative   Not on file   Social Drivers of Health   Financial Resource Strain: Not on file  Food Insecurity: No Food Insecurity (01/20/2023)   Hunger Vital Sign    Worried About Running Out of Food in the Last Year: Never true    Ran Out of Food in the Last  Year: Never true  Transportation Needs: No Transportation Needs (08/27/2023)   PRAPARE - Administrator, Civil Service (Medical): No    Lack of Transportation (Non-Medical): No  Physical Activity: Sufficiently Active (08/27/2023)   Exercise Vital Sign    Days of Exercise per Week: 5 days    Minutes of Exercise per Session: 30 min  Stress: Not on file  Social Connections: Not on file     Family History: The patient's family history includes Heart attack in her maternal grandmother; Lung cancer in her father.  ROS:   Please see the history of present illness.    (+) Arthralgias (+) Lower back pain (+) Snoring (+) Night sweats All other systems reviewed and are negative.  EKGs/Labs/Other Studies Reviewed:    Echo  02/23/2020:  1. Left ventricular ejection fraction, by estimation, is 60 to 65%. The  left ventricle has normal function. The left ventricle has no regional  wall motion abnormalities. Left ventricular diastolic parameters were  normal.   2. Right ventricular systolic function is normal. The right ventricular  size is normal. There is moderately elevated pulmonary artery systolic  pressure. The estimated right ventricular systolic pressure is 51.0 mmHg.   3. The mitral valve is normal in structure. No evidence of mitral valve  regurgitation. No evidence of mitral stenosis.   4. The aortic valve is normal in structure. Aortic valve regurgitation is  trivial. No aortic stenosis is present.   5. The inferior vena cava is normal in size with greater than 50%  respiratory variability, suggesting right atrial pressure of 3 mmHg.   Comparison(s): No prior Echocardiogram.   EKG:  EKG is personally reviewed. 08/27/2023: Not ordered.  Recent Labs: 08/09/2024: ALT 15; BUN 28; Creatinine 1.19; Hemoglobin 11.3; Platelet Count 164; Potassium 5.1; Sodium 135   Recent Lipid Panel No results found for: CHOL, TRIG, HDL, CHOLHDL, VLDL, LDLCALC,  LDLDIRECT  Physical Exam:    VS:  BP (!) 152/62   Pulse 62   Ht 5' 1 (1.549 m)   Wt 151 lb 6.4 oz (68.7 kg)   SpO2 97%   BMI 28.61 kg/m  , BMI Body mass index is 28.61 kg/m. GENERAL:  Well appearing HEENT: Pupils equal round and reactive, fundi not visualized, oral mucosa unremarkable NECK:  No jugular venous distention, waveform within normal limits, carotid upstroke brisk and symmetric, no bruits, no thyromegaly LUNGS:  Clear to auscultation bilaterally HEART:  RRR.  PMI not displaced  or sustained, S1 and S2 within normal limits, no S3, no S4, no clicks, no rubs, no murmurs ABD:  Flat, positive bowel sounds normal in frequency in pitch, no bruits, no rebound, no guarding, no midline pulsatile mass, no hepatomegaly, no splenomegaly EXT:  2 plus pulses throughout, no edema, no cyanosis, no clubbing SKIN:  No rashes, no nodules NEURO:  Cranial nerves II through XII grossly intact, motor grossly intact throughout PSYCH:  Cognitively intact, oriented to person place and time   ASSESSMENT/PLAN:    Assessment & Plan # Hypertension with suboptimal control Hypertension remains suboptimally controlled with average readings in the 140s. Recent syncope likely due to dehydration. Hydrochlorothiazide  discontinued. Patient interested in lifestyle changes before procedural intervention. - Continue amlodipine  10 mg and telmisartan . - Encourage lifestyle modifications: exercise, sodium restriction, weight management. - Provided information on renal denervation for review. - Reassess blood pressure control in a few months.  # Hyperlipidemia:  Continue rosuvastatin .  # Fatigue and decreased energy Fatigue may be related to suboptimal blood pressure control, medication side effects, or decreased physical activity. - Encourage regular physical activity, aiming for 150 minutes per week.  # Gait instability Gait instability may be exacerbated by decreased physical activity and potential  medication side effects. - Encourage use of gym facilities for safe exercise environment.  # Back pain Experiencing back pain, particularly when sitting for extended periods. - Encourage regular physical activity to improve back pain.  Screening for Secondary Hypertension:     08/27/2023    9:32 AM  Causes  Drugs/Herbals Screened     - Comments high sodium diet.  1 coffee daily.  Rare EtOH.  Renovascular HTN N/A  Sleep Apnea Screened     - Comments snores.  no other symptoms.  no overnight desaturation  Thyroid  Disease Screened  Hyperaldosteronism N/A  Pheochromocytoma N/A  Cushing's Syndrome N/A  Hyperparathyroidism Screened  Coarctation of the Aorta Screened     - Comments BP symmetric  Compliance Screened    Relevant Labs/Studies:    Latest Ref Rng & Units 08/09/2024   11:40 AM 02/10/2024    9:26 PM 02/06/2024    8:39 AM  Basic Labs  Sodium 135 - 145 mmol/L 135  137  138   Potassium 3.5 - 5.1 mmol/L 5.1  4.1  4.6   Creatinine 0.44 - 1.00 mg/dL 8.80  8.84  8.79       Disposition:    F/u in 3 months  Medication Adjustments/Labs and Tests Ordered: Current medicines are reviewed at length with the patient today.  Concerns regarding medicines are outlined above.   No orders of the defined types were placed in this encounter.  No orders of the defined types were placed in this encounter.   Signed, Annabella Scarce, MD  09/09/2024 4:52 PM    [facility]

## 2024-09-13 ENCOUNTER — Other Ambulatory Visit: Payer: Self-pay

## 2024-09-13 ENCOUNTER — Other Ambulatory Visit (HOSPITAL_BASED_OUTPATIENT_CLINIC_OR_DEPARTMENT_OTHER): Payer: Self-pay

## 2024-09-13 DIAGNOSIS — F331 Major depressive disorder, recurrent, moderate: Secondary | ICD-10-CM | POA: Diagnosis not present

## 2024-09-13 DIAGNOSIS — G4700 Insomnia, unspecified: Secondary | ICD-10-CM | POA: Diagnosis not present

## 2024-09-13 DIAGNOSIS — F411 Generalized anxiety disorder: Secondary | ICD-10-CM | POA: Diagnosis not present

## 2024-09-13 DIAGNOSIS — I10 Essential (primary) hypertension: Secondary | ICD-10-CM | POA: Diagnosis not present

## 2024-09-13 MED ORDER — SERTRALINE HCL 50 MG PO TABS: 50.0000 mg | ORAL_TABLET | Freq: Every morning | ORAL | 3 refills | Status: AC

## 2024-09-13 MED ORDER — SERTRALINE HCL 25 MG PO TABS
25.0000 mg | ORAL_TABLET | Freq: Every morning | ORAL | 3 refills | Status: AC
  Filled 2024-09-13: qty 90, 90d supply, fill #0

## 2024-09-13 NOTE — Progress Notes (Signed)
 Specialty Pharmacy Refill Coordination Note  Jodi Woods is a 83 y.o. female contacted today regarding refills of specialty medication(s) Dasatinib  (SPRYCEL )   Patient requested Delivery   Delivery date: 09/16/24   Verified address: [address]  [place]   Medication will be filled on 09/15/24.

## 2024-09-14 ENCOUNTER — Other Ambulatory Visit: Payer: Self-pay

## 2024-09-21 DIAGNOSIS — M85852 Other specified disorders of bone density and structure, left thigh: Secondary | ICD-10-CM | POA: Diagnosis not present

## 2024-09-21 DIAGNOSIS — M85851 Other specified disorders of bone density and structure, right thigh: Secondary | ICD-10-CM | POA: Diagnosis not present

## 2024-09-21 DIAGNOSIS — Z1231 Encounter for screening mammogram for malignant neoplasm of breast: Secondary | ICD-10-CM | POA: Diagnosis not present

## 2024-09-28 ENCOUNTER — Encounter (HOSPITAL_BASED_OUTPATIENT_CLINIC_OR_DEPARTMENT_OTHER): Payer: Self-pay | Admitting: Cardiovascular Disease

## 2024-09-28 ENCOUNTER — Other Ambulatory Visit (HOSPITAL_BASED_OUTPATIENT_CLINIC_OR_DEPARTMENT_OTHER): Payer: Self-pay

## 2024-10-05 ENCOUNTER — Other Ambulatory Visit (HOSPITAL_BASED_OUTPATIENT_CLINIC_OR_DEPARTMENT_OTHER): Payer: Self-pay

## 2024-10-05 ENCOUNTER — Other Ambulatory Visit: Payer: Self-pay

## 2024-10-06 ENCOUNTER — Other Ambulatory Visit: Payer: Self-pay

## 2024-10-07 ENCOUNTER — Other Ambulatory Visit: Payer: Self-pay

## 2024-10-11 DIAGNOSIS — R5383 Other fatigue: Secondary | ICD-10-CM | POA: Diagnosis not present

## 2024-10-12 ENCOUNTER — Other Ambulatory Visit (HOSPITAL_COMMUNITY): Payer: Self-pay

## 2024-10-13 ENCOUNTER — Encounter (HOSPITAL_BASED_OUTPATIENT_CLINIC_OR_DEPARTMENT_OTHER): Admitting: Cardiovascular Disease

## 2024-10-14 ENCOUNTER — Other Ambulatory Visit (HOSPITAL_COMMUNITY): Payer: Self-pay

## 2024-10-14 ENCOUNTER — Other Ambulatory Visit: Payer: Self-pay

## 2024-10-14 ENCOUNTER — Other Ambulatory Visit (HOSPITAL_BASED_OUTPATIENT_CLINIC_OR_DEPARTMENT_OTHER): Payer: Self-pay

## 2024-10-14 DIAGNOSIS — Z79899 Other long term (current) drug therapy: Secondary | ICD-10-CM | POA: Diagnosis not present

## 2024-10-14 DIAGNOSIS — E039 Hypothyroidism, unspecified: Secondary | ICD-10-CM | POA: Diagnosis not present

## 2024-10-14 DIAGNOSIS — R600 Localized edema: Secondary | ICD-10-CM | POA: Diagnosis not present

## 2024-10-14 DIAGNOSIS — I10 Essential (primary) hypertension: Secondary | ICD-10-CM | POA: Diagnosis not present

## 2024-10-14 MED ORDER — CARVEDILOL 12.5 MG PO TABS
12.5000 mg | ORAL_TABLET | Freq: Two times a day (BID) | ORAL | 0 refills | Status: DC
  Filled 2024-10-14: qty 60, 30d supply, fill #0

## 2024-10-14 NOTE — Progress Notes (Signed)
 Specialty Pharmacy Refill Coordination Note  Jodi Woods is a 83 y.o. female contacted today regarding refills of specialty medication(s) Dasatinib  (SPRYCEL )   Patient requested Delivery   Delivery date: 10/19/24   Verified address: [address]  [place]   Medication will be filled on: 10/18/24

## 2024-10-18 ENCOUNTER — Other Ambulatory Visit: Payer: Self-pay

## 2024-10-28 ENCOUNTER — Other Ambulatory Visit: Payer: Self-pay

## 2024-11-09 ENCOUNTER — Other Ambulatory Visit (HOSPITAL_COMMUNITY): Payer: Self-pay

## 2024-11-10 ENCOUNTER — Other Ambulatory Visit: Payer: Self-pay

## 2024-11-12 ENCOUNTER — Other Ambulatory Visit: Payer: Self-pay

## 2024-11-12 NOTE — Progress Notes (Signed)
 Specialty Pharmacy Refill Coordination Note  Jodi Woods is a 83 y.o. female contacted today regarding refills of specialty medication(s) Dasatinib  (SPRYCEL )   Patient requested Delivery   Delivery date: 11/23/24   Verified address: [address]  [place]    Medication will be filled on: 11/22/24

## 2024-11-12 NOTE — Progress Notes (Signed)
 Specialty Pharmacy Ongoing Clinical Assessment Note  Jodi Woods is a 83 y.o. female who is being followed by the specialty pharmacy service for RxSp Oncology   Patient's specialty medication(s) reviewed today: Dasatinib  (SPRYCEL )   Missed doses in the last 4 weeks: 0   Patient/Caregiver did not have any additional questions or concerns.   Therapeutic benefit summary: Patient is achieving benefit   Adverse events/side effects summary: No adverse events/side effects   Patient's therapy is appropriate to: Continue    Goals Addressed             This Visit's Progress    Achieve or maintain remission   On track    Patient is on track. Patient will maintain adherence. Per provider visit notes from 02/12/24 BCR/ABL shows patient remains in molecular remission. BCR/ABL was drawn again 08/09/24, but no results at this time.         Follow up: 6 months  [facility]

## 2024-11-15 ENCOUNTER — Other Ambulatory Visit (HOSPITAL_BASED_OUTPATIENT_CLINIC_OR_DEPARTMENT_OTHER): Payer: Self-pay

## 2024-11-22 ENCOUNTER — Other Ambulatory Visit: Payer: Self-pay

## 2024-11-23 ENCOUNTER — Other Ambulatory Visit (HOSPITAL_BASED_OUTPATIENT_CLINIC_OR_DEPARTMENT_OTHER): Payer: Self-pay

## 2024-11-30 ENCOUNTER — Other Ambulatory Visit (HOSPITAL_BASED_OUTPATIENT_CLINIC_OR_DEPARTMENT_OTHER): Payer: Self-pay

## 2024-12-01 ENCOUNTER — Other Ambulatory Visit (HOSPITAL_BASED_OUTPATIENT_CLINIC_OR_DEPARTMENT_OTHER): Payer: Self-pay

## 2024-12-01 ENCOUNTER — Other Ambulatory Visit: Payer: Self-pay

## 2024-12-01 MED ORDER — CARVEDILOL 25 MG PO TABS
25.0000 mg | ORAL_TABLET | Freq: Two times a day (BID) | ORAL | 3 refills | Status: AC
  Filled 2024-12-01: qty 180, 90d supply, fill #0

## 2024-12-01 MED ORDER — LEVOTHYROXINE SODIUM 150 MCG PO TABS
150.0000 ug | ORAL_TABLET | Freq: Every morning | ORAL | 1 refills | Status: AC
  Filled 2024-12-01: qty 90, 90d supply, fill #0

## 2024-12-01 MED ORDER — TOBRAMYCIN 0.3 % OP SOLN
2.0000 [drp] | Freq: Four times a day (QID) | OPHTHALMIC | 0 refills | Status: AC
  Filled 2024-12-01: qty 5, 6d supply, fill #0

## 2024-12-01 MED ORDER — CEFDINIR 300 MG PO CAPS
300.0000 mg | ORAL_CAPSULE | Freq: Two times a day (BID) | ORAL | 0 refills | Status: DC
  Filled 2024-12-01: qty 10, 5d supply, fill #0

## 2024-12-14 ENCOUNTER — Telehealth (HOSPITAL_BASED_OUTPATIENT_CLINIC_OR_DEPARTMENT_OTHER): Payer: Self-pay | Admitting: *Deleted

## 2024-12-14 ENCOUNTER — Encounter (HOSPITAL_BASED_OUTPATIENT_CLINIC_OR_DEPARTMENT_OTHER): Payer: Self-pay | Admitting: Cardiovascular Disease

## 2024-12-14 ENCOUNTER — Encounter (HOSPITAL_BASED_OUTPATIENT_CLINIC_OR_DEPARTMENT_OTHER): Admitting: Cardiovascular Disease

## 2024-12-14 ENCOUNTER — Other Ambulatory Visit (HOSPITAL_BASED_OUTPATIENT_CLINIC_OR_DEPARTMENT_OTHER): Payer: Self-pay

## 2024-12-14 ENCOUNTER — Ambulatory Visit (INDEPENDENT_AMBULATORY_CARE_PROVIDER_SITE_OTHER): Admitting: Cardiovascular Disease

## 2024-12-14 VITALS — BP 186/84 | HR 55 | Ht 61.0 in | Wt 147.0 lb

## 2024-12-14 DIAGNOSIS — E7800 Pure hypercholesterolemia, unspecified: Secondary | ICD-10-CM

## 2024-12-14 DIAGNOSIS — I10 Essential (primary) hypertension: Secondary | ICD-10-CM

## 2024-12-14 MED ORDER — TELMISARTAN 40 MG PO TABS
40.0000 mg | ORAL_TABLET | Freq: Every day | ORAL | 3 refills | Status: AC
  Filled 2024-12-14: qty 90, 90d supply, fill #0

## 2024-12-14 MED ORDER — AMLODIPINE BESYLATE 5 MG PO TABS
5.0000 mg | ORAL_TABLET | Freq: Every day | ORAL | 2 refills | Status: AC
  Filled 2024-12-14: qty 90, 90d supply, fill #0

## 2024-12-14 NOTE — Telephone Encounter (Signed)
 Patient was seen by Dr Raford today, left before I could go over her AVS Left message to call back and mychart message to patient

## 2024-12-14 NOTE — Progress Notes (Unsigned)
 "  [facility] Initial Assessment:    Date:  12/15/2024   ID:  Jodi Woods, DOB 25-Nov-1941, MRN [id number]  PCP:  Waylan Almarie SAUNDERS, MD  Cardiologist:  None  Nephrologist:  Referring MD: Waylan Almarie SAUNDERS, MD   CC: Hypertension  History of Present Illness:    Jodi Woods is a 84 y.o. female with a hx of hypertension, hyperlipidemia, hypothyroidism, GERD, ANA+, systemic lupus erythematosus, chronic myelocytic leukemia, retinal aneurysm, here for follow up.  She was first seen in the [facility] 08/2023.  She was seen 06/12/2023 by telehealth visit. She was monitoring home blood pressures and reported readings including 144/67, 138/66 on a regimen of carvedilol  3.125 mg BID, telmisartan -HCTZ 80-25 mg daily. She requested a referral to establish care with cardiology; never had a prior stress test. She did have an echo 01/2020 showing LVEF 60-65%, normal diastolic parameters, moderately elevated PASP 51.0 mmHg, trivial aortic regurgitation but no stenosis, and no evidence of mitral regurgitation or stenosis.  Jodi Woods notes that her BP has been difficult to control since 2015. Her PCP increased her carvedilol  to 6.25 mg BID, and her HCTZ was discontinued. She expresses concerns about being on carvedilol  as in the past this caused side effects of bradycardia in the 40's. In the office her blood pressure was 173/62 initially, and 170/58 on manual recheck. She was asydmptomatic.  Home BP was elevated as well.   At her initial visit spironolactone  was added.  She followed up with Caitlin walker 01/2024 after an episode of syncope.  She fell asleep and got up at 7 am when she had a syncopal episode without prodrome.  BP was ranging 130-150s.  She was not orthostatic in the office. Echo revealed LVEF 60-65% with grade 2 diastolic dysfunction.  Monitor showed short episodes of SVT.  Carotid Dopplers were negative.    At her visit 08/2024 she struggled with fatigue.   She reported an episode of syncope which she attributed to dehydration.  She noted hydrochlorothiazide  had been discontinued due to dehydration.  Blood pressure was uncontrolled in the office.  She was given information about renal denervation.  Discussed the use of AI scribe software for clinical note transcription with the patient, who gave verbal consent to proceed.  History of Present Illness Jodi Woods has persistent high blood pressure readings despite medication adjustments, with recent readings as high as 192/unknown and 197/86, and fluctuations down to 129/65. Amlodipine  was discontinued due to ankle swelling. Current medications include carvedilol  25 mg twice daily and telmisartan  40 mg daily. She was previously on a higher dose of telmisartan  and hydrochlorothiazide , which was stopped due to dehydration concerns and kidney function issues.  She has a history of chronic myeloid leukemia and was taking dasatinib , which she believes is the same as Sprycel . She experiences fatigue and feeling unwell, which she associates with the medication change.  She experiences left ankle swelling, which she believes was caused by amlodipine , but notes that some swelling has continued even after stopping the medication. No history of injury to the ankle and no significant pain in the calf.  She feels fatigued and spends significant time in bed, which she attributes to cleaning air vents and potential exposure to mold. She also experiences a runny nose and headaches.  Her cholesterol was checked recently and her LDL cholesterol is borderline.   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 per pt   Arthritis    CML (chronic myelocytic leukemia) (HCC) 02/07/2020   High cholesterol    Hypertension    Hypothyroidism     Iron deficiency anemia due to chronic blood loss 02/07/2020   Pneumonia    hx of x 2   Primary hypertension 08/27/2023   Pure hypercholesterolemia 08/27/2023   Stroke [facility])    found on MRI whle she was being tested for hearing loss- 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   aspirin EC 81 MG tablet Take 81 mg by mouth daily.   carvedilol  (COREG ) 25 MG tablet Take 1 tablet (25 mg total) by mouth 2 (two) times daily with food.   Cholecalciferol  (VITAMIN D) 125 MCG (5000 UT) CAPS Take 5,000 Units by mouth daily.   Cyanocobalamin  (VITAMIN B12) 1000 MCG TBCR Take 1 tablet by mouth 2 times a week   famotidine  (PEPCID ) 20 MG tablet Take 1 tablet (20 mg total) by mouth 2 (two) times daily.   levothyroxine  (SYNTHROID ) 150 MCG tablet Take 1 tablet (150 mcg total) by mouth in the morning on an empty stomach.   rosuvastatin  (CRESTOR ) 5 MG tablet Take 1 tablet (5 mg total) by mouth at bedtime for high cholesterol   sertraline  (ZOLOFT ) 25 MG tablet Take 1 tablet (25 mg total) by mouth in the morning along with 50 mg tablet   sertraline  (ZOLOFT ) 50 MG tablet Take 1 tablet (50 mg total) by mouth every morning. Take with the 25 mg tablet.   SUCRALFATE PO Take by mouth.   telmisartan  (MICARDIS ) 40 MG tablet Take 1 tablet (40 mg total) by mouth daily.   tobramycin  (TOBREX ) 0.3 % ophthalmic solution Place 2 drops into the affected eye(s) 4 (four) times daily for 3 days.   [DISCONTINUED] dasatinib  (SPRYCEL ) 50 MG tablet Take 1 tablet (50 mg total)  by mouth every other day.   [DISCONTINUED] telmisartan  (MICARDIS ) 80 MG tablet Take 1 tablet (80 mg total) by mouth daily. (Patient taking differently: Take 40 mg by mouth daily.)     Allergies:   Versed [midazolam], Amlodipine  besylate, Amoxicillin -pot clavulanate, and Tetanus-diphtheria toxoids td   Social History   Socioeconomic History   Marital status: Widowed    Spouse name: Not on file   Number of children: Not on file   Years of education: Not on file   Highest education level: Not on file  Occupational History   Not on file  Tobacco Use   Smoking status: Never    Passive exposure: Past   Smokeless tobacco: Never  Vaping Use   Vaping status: Never Used  Substance and Sexual Activity   Alcohol  use: Yes    Comment: rare   Drug use: Never   Sexual activity: Not Currently  Other Topics Concern   Not on file  Social History Narrative   Not on file   Social Drivers of Health   Tobacco Use: Low  Risk (12/15/2024)   Patient History    Smoking Tobacco Use: Never    Smokeless Tobacco Use: Never    Passive Exposure: Past  Financial Resource Strain: Not on file  Food Insecurity: No Food Insecurity (01/20/2023)   Hunger Vital Sign    Worried About Running Out of Food in the Last Year: Never true    Ran Out of Food in the Last Year: Never true  Transportation Needs: No Transportation Needs (08/27/2023)   PRAPARE - Administrator, Civil Service (Medical): No    Lack of Transportation (Non-Medical): No  Physical Activity: Sufficiently Active (08/27/2023)   Exercise Vital Sign    Days of Exercise per Week: 5 days    Minutes of Exercise per Session: 30 min  Stress: Not on file  Social Connections: Not on file  Depression (PHQ2-9): Low Risk (08/09/2024)   Depression (PHQ2-9)    PHQ-2 Score: 0  Alcohol  Screen: Low Risk (08/27/2023)   Alcohol  Screen    Last Alcohol  Screening Score (AUDIT): 2  Housing: Low Risk (08/27/2023)   Housing    Last Housing Risk Score: 0   Utilities: Not At Risk (01/20/2023)   AHC Utilities    Threatened with loss of utilities: No  Health Literacy: Not on file     Family History: The patient's family history includes Heart attack in her maternal grandmother; Lung cancer in her father.  ROS:   Please see the history of present illness.    (+) Arthralgias (+) Lower back pain (+) Snoring (+) Night sweats All other systems reviewed and are negative.  EKGs/Labs/Other Studies Reviewed:    Echo  02/23/2020:  1. Left ventricular ejection fraction, by estimation, is 60 to 65%. The  left ventricle has normal function. The left ventricle has no regional  wall motion abnormalities. Left ventricular diastolic parameters were  normal.   2. Right ventricular systolic function is normal. The right ventricular  size is normal. There is moderately elevated pulmonary artery systolic  pressure. The estimated right ventricular systolic pressure is 51.0 mmHg.   3. The mitral valve is normal in structure. No evidence of mitral valve  regurgitation. No evidence of mitral stenosis.   4. The aortic valve is normal in structure. Aortic valve regurgitation is  trivial. No aortic stenosis is present.   5. The inferior vena cava is normal in size with greater than 50%  respiratory variability, suggesting right atrial pressure of 3 mmHg.   Comparison(s): No prior Echocardiogram.   EKG:  EKG is personally reviewed. 08/27/2023: Not ordered.  Recent Labs: 08/09/2024: ALT 15; BUN 28; Creatinine 1.19; Hemoglobin 11.3; Platelet Count 164; Potassium 5.1; Sodium 135   Recent Lipid Panel No results found for: CHOL, TRIG, HDL, CHOLHDL, VLDL, LDLCALC, LDLDIRECT  Physical Exam:    VS:  BP (!) 186/64   Pulse (!) 55   Ht 5' 1 (1.549 m)   Wt 147 lb (66.7 kg)   SpO2 96%   BMI 27.78 kg/m  , BMI Body mass index is 27.78 kg/m. GENERAL:  Well appearing HEENT: Pupils equal round and reactive, fundi not visualized, oral mucosa  unremarkable NECK:  No jugular venous distention, waveform within normal limits, carotid upstroke brisk and symmetric, no bruits, no thyromegaly LUNGS:  Clear to auscultation bilaterally HEART:  RRR.  PMI not displaced or sustained, S1 and S2 within normal limits, no S3, no S4, no clicks, no rubs, no murmurs ABD:  Flat, positive bowel sounds normal in frequency in pitch,  no bruits, no rebound, no guarding, no midline pulsatile mass, no hepatomegaly, no splenomegaly EXT:  2 plus pulses throughout, 1 R LE edema, no cyanosis, no clubbing SKIN:  No rashes, no nodules NEURO:  Cranial nerves II through XII grossly intact, motor grossly intact throughout PSYCH:  Cognitively intact, oriented to person place and time  Physical Exam VITALS: BP- 186/64 GENERAL: Alert, cooperative, well developed, no acute distress. HEENT: Normocephalic, normal oropharynx, moist mucous membranes. CHEST: Clear to auscultation bilaterally, no wheezes, rhonchi, or crackles. CARDIOVASCULAR: Normal heart rate and rhythm, S1 and S2 normal without murmurs. ABDOMEN: Soft, non-tender, non-distended, without organomegaly, normal bowel sounds. EXTREMITIES: No cyanosis or edema. NEUROLOGICAL: Cranial nerves grossly intact, moves all extremities without gross motor or sensory deficit.   ASSESSMENT/PLAN:    Assessment & Plan # Uncontrolled hypertension Blood pressure remains uncontrolled with current regimen.  Unable to view primary care records at this time.  We will request a copy.  Amlodipine  was discontinued presumably due to ankle swelling, but swelling persists despite being off it for over a month. We will resume at 5 mg.  She was on 10mg  previously.  Discussed renal denervation, which is safe and effective, but she is not ready to proceed. - Restarted amlodipine  at 5 mg daily. - Continue telmisartan  40 mg daily.  Unclear why it was reduced from 80mg  previously.  - Continue carvedilol  q25mg  bidl  - Monitor blood pressure  regularly. - Consider renal denervation procedure in the future if blood pressure remains uncontrolled given her intolerance to multiple medications.   # Hyperlipidemia:  Continue rosuvastatin .   Screening for Secondary Hypertension:     08/27/2023    9:32 AM  Causes  Drugs/Herbals Screened     - Comments high sodium diet.  1 coffee daily.  Rare EtOH.  Renovascular HTN N/A  Sleep Apnea Screened     - Comments snores.  no other symptoms.  no overnight desaturation  Thyroid  Disease Screened  Hyperaldosteronism N/A  Pheochromocytoma N/A  Cushing's Syndrome N/A  Hyperparathyroidism Screened  Coarctation of the Aorta Screened     - Comments BP symmetric  Compliance Screened    Relevant Labs/Studies:    Latest Ref Rng & Units 08/09/2024   11:40 AM 02/10/2024    9:26 PM 02/06/2024    8:39 AM  Basic Labs  Sodium 135 - 145 mmol/L 135  137  138   Potassium 3.5 - 5.1 mmol/L 5.1  4.1  4.6   Creatinine 0.44 - 1.00 mg/dL 8.80  8.84  8.79       Disposition:    F/u in 2 months  Medication Adjustments/Labs and Tests Ordered: Current medicines are reviewed at length with the patient today.  Concerns regarding medicines are outlined above.   No orders of the defined types were placed in this encounter.  Meds ordered this encounter  Medications   telmisartan  (MICARDIS ) 40 MG tablet    Sig: Take 1 tablet (40 mg total) by mouth daily.    Dispense:  90 tablet    Refill:  3    D/C 80 mg Rx   amLODipine  (NORVASC ) 5 MG tablet    Sig: Take 1 tablet (5 mg total) by mouth daily for hypertenstion    Dispense:  90 tablet    Refill:  2    Signed, Annabella Scarce, MD  12/15/2024 7:00 PM    [facility]  "

## 2024-12-14 NOTE — Patient Instructions (Addendum)
 Medication Instructions:  RESUME AMLODIPINE  5 MG DAILY   Labwork: NONE  Testing/Procedures: NONE  Follow-Up:  01/26/2025 11:30 am with Dr Raford

## 2024-12-15 ENCOUNTER — Encounter (HOSPITAL_BASED_OUTPATIENT_CLINIC_OR_DEPARTMENT_OTHER): Payer: Self-pay | Admitting: Cardiovascular Disease

## 2024-12-15 ENCOUNTER — Other Ambulatory Visit: Payer: Self-pay | Admitting: Hematology & Oncology

## 2024-12-15 ENCOUNTER — Other Ambulatory Visit (HOSPITAL_BASED_OUTPATIENT_CLINIC_OR_DEPARTMENT_OTHER): Payer: Self-pay

## 2024-12-15 ENCOUNTER — Other Ambulatory Visit: Payer: Self-pay

## 2024-12-15 MED ORDER — DASATINIB 50 MG PO TABS
50.0000 mg | ORAL_TABLET | ORAL | 3 refills | Status: AC
  Filled 2024-12-15 – 2024-12-16 (×3): qty 15, 30d supply, fill #0

## 2024-12-15 MED ORDER — TELMISARTAN 80 MG PO TABS
80.0000 mg | ORAL_TABLET | Freq: Every day | ORAL | 3 refills | Status: AC
  Filled 2024-12-15: qty 90, 90d supply, fill #0

## 2024-12-16 ENCOUNTER — Other Ambulatory Visit: Payer: Self-pay

## 2024-12-20 ENCOUNTER — Other Ambulatory Visit: Payer: Self-pay

## 2024-12-20 NOTE — Progress Notes (Signed)
 Specialty Pharmacy Refill Coordination Note  Jodi Woods is a 84 y.o. female contacted today regarding refills of specialty medication(s) Dasatinib  (SPRYCEL )   Patient requested Delivery   Delivery date: 01/03/25   Verified address: [address]  [place]   Medication will be filled on: 12/31/24

## 2024-12-31 ENCOUNTER — Other Ambulatory Visit: Payer: Self-pay

## 2025-01-26 ENCOUNTER — Encounter (HOSPITAL_BASED_OUTPATIENT_CLINIC_OR_DEPARTMENT_OTHER): Admitting: Cardiovascular Disease

## 2025-02-07 ENCOUNTER — Inpatient Hospital Stay

## 2025-02-07 ENCOUNTER — Ambulatory Visit: Admitting: Hematology & Oncology
